# Patient Record
Sex: Male | Born: 1950
Health system: Southern US, Community
[De-identification: ages and names within clinical notes are randomized; demographics above are authoritative.]

## PROBLEM LIST (undated history)

## (undated) DIAGNOSIS — F419 Anxiety disorder, unspecified: Secondary | ICD-10-CM

## (undated) DIAGNOSIS — K5730 Diverticulosis of large intestine without perforation or abscess without bleeding: Secondary | ICD-10-CM

## (undated) DIAGNOSIS — M1990 Unspecified osteoarthritis, unspecified site: Secondary | ICD-10-CM

## (undated) DIAGNOSIS — R42 Dizziness and giddiness: Secondary | ICD-10-CM

## (undated) DIAGNOSIS — G459 Transient cerebral ischemic attack, unspecified: Secondary | ICD-10-CM

## (undated) HISTORY — PX: KNEE ARTHROPLASTY: SHX992

## (undated) HISTORY — PX: DUODENAL DIVERTICULECTOMY: SHX6386

## (undated) HISTORY — DX: Diverticulosis of large intestine without perforation or abscess without bleeding: K57.30

## (undated) HISTORY — DX: Anxiety disorder, unspecified: F41.9

---

## 1998-07-29 ENCOUNTER — Emergency Department (HOSPITAL_COMMUNITY): Admission: EM | Admit: 1998-07-29 | Discharge: 1998-07-29 | Payer: Self-pay

## 2001-01-18 ENCOUNTER — Emergency Department (HOSPITAL_COMMUNITY): Admission: EM | Admit: 2001-01-18 | Discharge: 2001-01-18 | Payer: Self-pay | Admitting: Emergency Medicine

## 2001-01-18 ENCOUNTER — Encounter: Payer: Self-pay | Admitting: Emergency Medicine

## 2001-09-04 ENCOUNTER — Encounter: Admission: RE | Admit: 2001-09-04 | Discharge: 2001-09-04 | Payer: Self-pay | Admitting: Gastroenterology

## 2001-09-04 ENCOUNTER — Encounter: Payer: Self-pay | Admitting: Gastroenterology

## 2001-12-20 ENCOUNTER — Encounter: Admission: RE | Admit: 2001-12-20 | Discharge: 2001-12-20 | Payer: Self-pay | Admitting: Gastroenterology

## 2001-12-20 ENCOUNTER — Encounter: Payer: Self-pay | Admitting: Gastroenterology

## 2002-01-15 ENCOUNTER — Encounter: Payer: Self-pay | Admitting: General Surgery

## 2002-01-22 ENCOUNTER — Encounter (INDEPENDENT_AMBULATORY_CARE_PROVIDER_SITE_OTHER): Payer: Self-pay | Admitting: *Deleted

## 2002-01-22 ENCOUNTER — Inpatient Hospital Stay (HOSPITAL_COMMUNITY): Admission: RE | Admit: 2002-01-22 | Discharge: 2002-01-28 | Payer: Self-pay | Admitting: General Surgery

## 2003-11-27 ENCOUNTER — Ambulatory Visit (HOSPITAL_BASED_OUTPATIENT_CLINIC_OR_DEPARTMENT_OTHER): Admission: RE | Admit: 2003-11-27 | Discharge: 2003-11-27 | Payer: Self-pay | Admitting: Pulmonary Disease

## 2004-02-07 ENCOUNTER — Emergency Department (HOSPITAL_COMMUNITY): Admission: EM | Admit: 2004-02-07 | Discharge: 2004-02-07 | Payer: Self-pay | Admitting: Emergency Medicine

## 2005-03-31 ENCOUNTER — Ambulatory Visit: Payer: Self-pay | Admitting: Pulmonary Disease

## 2005-04-06 ENCOUNTER — Ambulatory Visit: Payer: Self-pay | Admitting: Pulmonary Disease

## 2006-01-25 ENCOUNTER — Ambulatory Visit: Payer: Self-pay | Admitting: Pulmonary Disease

## 2006-07-03 ENCOUNTER — Emergency Department (HOSPITAL_COMMUNITY): Admission: EM | Admit: 2006-07-03 | Discharge: 2006-07-03 | Payer: Self-pay | Admitting: Emergency Medicine

## 2006-10-04 ENCOUNTER — Emergency Department (HOSPITAL_COMMUNITY): Admission: EM | Admit: 2006-10-04 | Discharge: 2006-10-04 | Payer: Self-pay | Admitting: Emergency Medicine

## 2006-10-04 ENCOUNTER — Ambulatory Visit: Payer: Self-pay | Admitting: Pulmonary Disease

## 2007-07-01 IMAGING — CR DG CHEST 1V PORT
1 series · 1 of 1 positions shown · non-contrast
Comparison: 02/07/2004

CLINICAL DATA: Shortness of breath

PORTABLE CHEST - 1 VIEW:

[view not recorded]
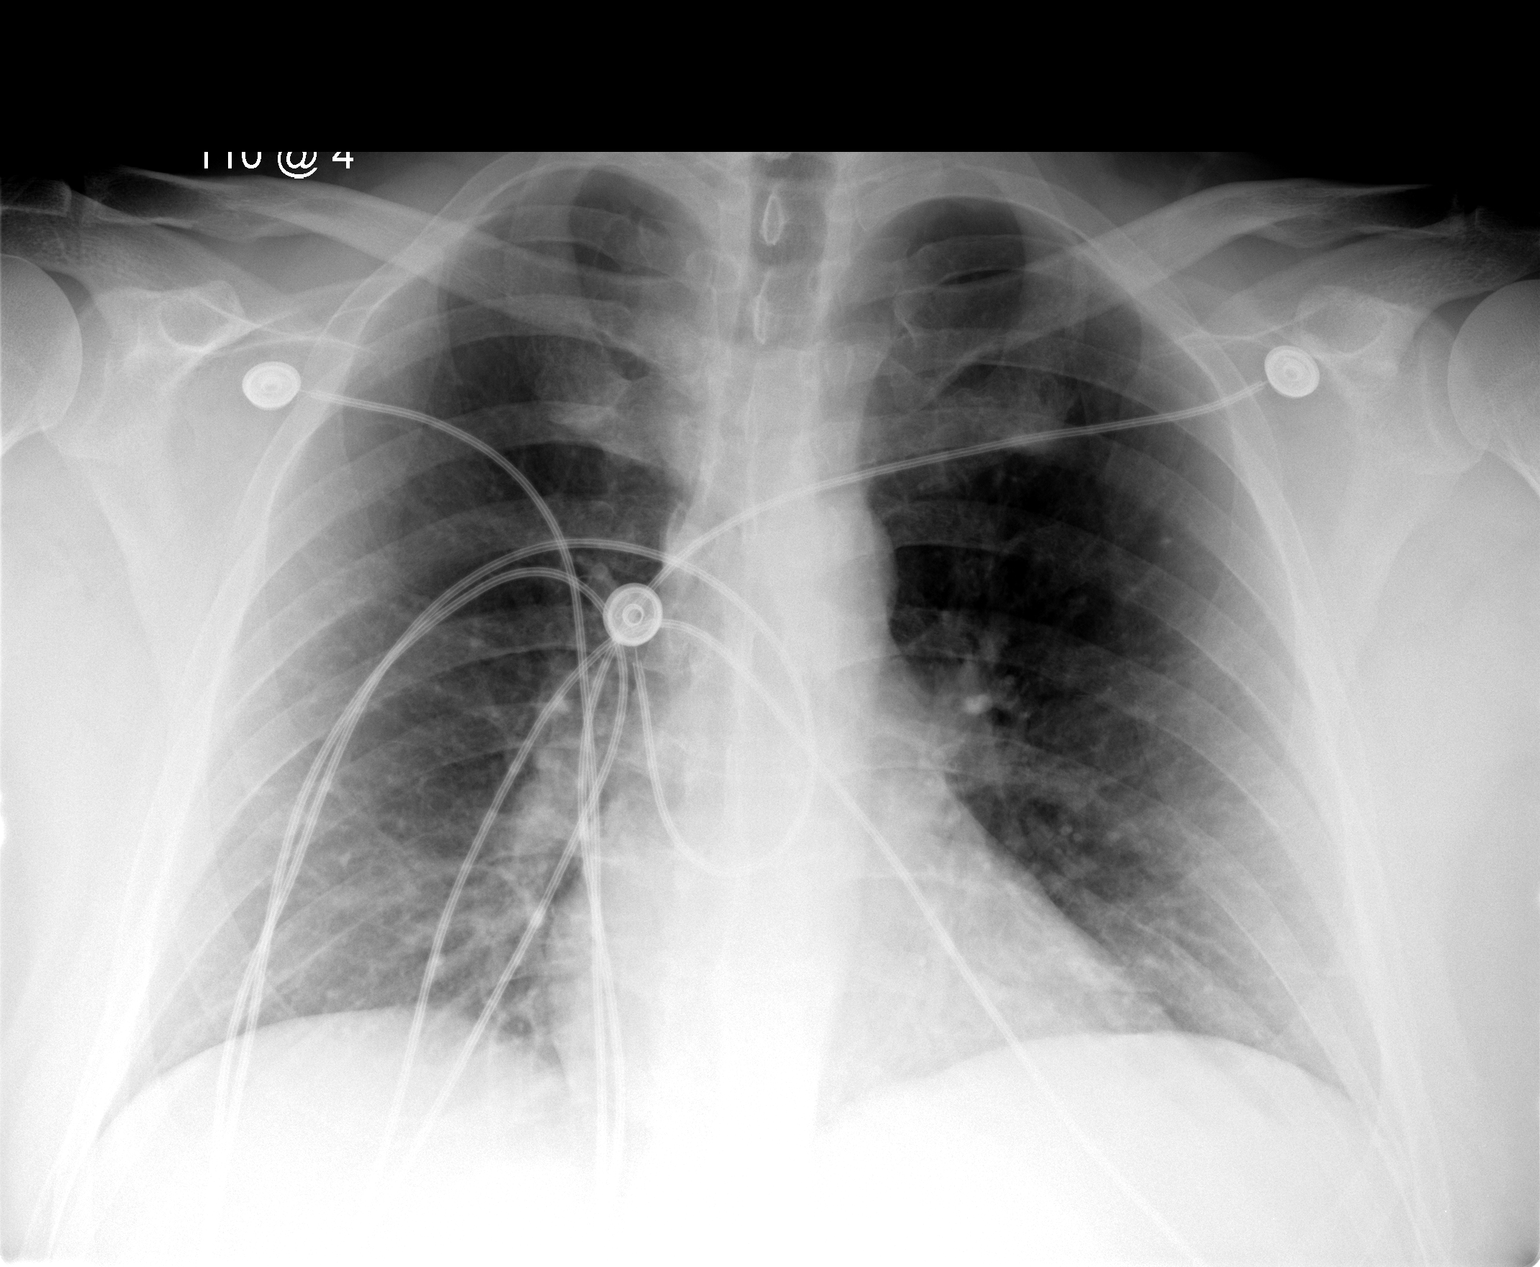

[1 of 1 positions shown; findings below may reference images not displayed]

FINDINGS: Heart and mediastinal contours are within normal limits. Minimal
bibasilar atelectasis is noted. No effusions. Visualized skeleton unremarkable.
IMPRESSION: Minimal bibasilar atelectasis.

## 2008-01-30 ENCOUNTER — Inpatient Hospital Stay (HOSPITAL_COMMUNITY): Admission: EM | Admit: 2008-01-30 | Discharge: 2008-01-31 | Payer: Self-pay | Admitting: Emergency Medicine

## 2008-01-30 ENCOUNTER — Ambulatory Visit: Payer: Self-pay | Admitting: Cardiovascular Disease

## 2008-02-13 ENCOUNTER — Ambulatory Visit: Payer: Self-pay | Admitting: Cardiovascular Disease

## 2008-03-27 ENCOUNTER — Telehealth: Payer: Self-pay | Admitting: Pulmonary Disease

## 2008-05-17 ENCOUNTER — Ambulatory Visit: Payer: Self-pay | Admitting: Pulmonary Disease

## 2008-05-17 DIAGNOSIS — G2581 Restless legs syndrome: Secondary | ICD-10-CM | POA: Insufficient documentation

## 2008-05-17 DIAGNOSIS — G4733 Obstructive sleep apnea (adult) (pediatric): Secondary | ICD-10-CM | POA: Insufficient documentation

## 2008-05-17 DIAGNOSIS — I2510 Atherosclerotic heart disease of native coronary artery without angina pectoris: Secondary | ICD-10-CM | POA: Insufficient documentation

## 2008-05-17 DIAGNOSIS — M1990 Unspecified osteoarthritis, unspecified site: Secondary | ICD-10-CM | POA: Insufficient documentation

## 2008-05-17 DIAGNOSIS — E785 Hyperlipidemia, unspecified: Secondary | ICD-10-CM

## 2008-05-17 DIAGNOSIS — E669 Obesity, unspecified: Secondary | ICD-10-CM | POA: Insufficient documentation

## 2008-05-17 DIAGNOSIS — K5732 Diverticulitis of large intestine without perforation or abscess without bleeding: Secondary | ICD-10-CM

## 2008-05-21 ENCOUNTER — Ambulatory Visit: Payer: Self-pay | Admitting: Pulmonary Disease

## 2008-06-02 LAB — CONVERTED CEMR LAB
ALT: 53 units/L (ref 0–53)
Albumin: 4.2 g/dL (ref 3.5–5.2)
Basophils Absolute: 0 10*3/uL (ref 0.0–0.1)
Basophils Relative: 0.4 % (ref 0.0–3.0)
CO2: 30 meq/L (ref 19–32)
Chloride: 103 meq/L (ref 96–112)
Cholesterol: 116 mg/dL (ref 0–200)
Eosinophils Relative: 2.2 % (ref 0.0–5.0)
GFR calc Af Amer: 113 mL/min
GFR calc non Af Amer: 93 mL/min
LDL Cholesterol: 62 mg/dL (ref 0–99)
Lymphocytes Relative: 46.5 % — ABNORMAL HIGH (ref 12.0–46.0)
MCV: 93.9 fL (ref 78.0–100.0)
Monocytes Absolute: 0.7 10*3/uL (ref 0.1–1.0)
Monocytes Relative: 12 % (ref 3.0–12.0)
Neutro Abs: 2.3 10*3/uL (ref 1.4–7.7)
PSA: 0.61 ng/mL (ref 0.10–4.00)
Potassium: 5.2 meq/L — ABNORMAL HIGH (ref 3.5–5.1)
RBC: 5.13 M/uL (ref 4.22–5.81)
RDW: 11.8 % (ref 11.5–14.6)
Total Bilirubin: 1.4 mg/dL — ABNORMAL HIGH (ref 0.3–1.2)
VLDL: 16 mg/dL (ref 0–40)
WBC: 5.9 10*3/uL (ref 4.5–10.5)

## 2008-06-04 ENCOUNTER — Telehealth (INDEPENDENT_AMBULATORY_CARE_PROVIDER_SITE_OTHER): Payer: Self-pay | Admitting: *Deleted

## 2008-06-05 ENCOUNTER — Emergency Department: Payer: Self-pay | Admitting: Emergency Medicine

## 2008-06-14 ENCOUNTER — Emergency Department: Payer: Self-pay | Admitting: Emergency Medicine

## 2008-06-26 ENCOUNTER — Encounter: Payer: Self-pay | Admitting: Pulmonary Disease

## 2008-07-03 ENCOUNTER — Encounter: Payer: Self-pay | Admitting: Pulmonary Disease

## 2008-07-25 ENCOUNTER — Encounter: Payer: Self-pay | Admitting: Pulmonary Disease

## 2008-08-22 ENCOUNTER — Encounter: Payer: Self-pay | Admitting: Pulmonary Disease

## 2009-03-31 ENCOUNTER — Emergency Department (HOSPITAL_COMMUNITY): Admission: EM | Admit: 2009-03-31 | Discharge: 2009-03-31 | Payer: Self-pay | Admitting: Emergency Medicine

## 2009-08-11 ENCOUNTER — Ambulatory Visit: Payer: Self-pay | Admitting: Pulmonary Disease

## 2009-08-12 ENCOUNTER — Ambulatory Visit: Payer: Self-pay | Admitting: Pulmonary Disease

## 2009-08-12 DIAGNOSIS — R351 Nocturia: Secondary | ICD-10-CM | POA: Insufficient documentation

## 2009-08-12 DIAGNOSIS — I10 Essential (primary) hypertension: Secondary | ICD-10-CM | POA: Insufficient documentation

## 2009-09-01 LAB — CONVERTED CEMR LAB
AST: 30 units/L (ref 0–37)
Albumin: 4.2 g/dL (ref 3.5–5.2)
BUN: 14 mg/dL (ref 6–23)
Basophils Absolute: 0 10*3/uL (ref 0.0–0.1)
Basophils Relative: 0.8 % (ref 0.0–3.0)
Bilirubin, Direct: 0.2 mg/dL (ref 0.0–0.3)
CO2: 29 meq/L (ref 19–32)
Calcium: 9.6 mg/dL (ref 8.4–10.5)
Cholesterol: 160 mg/dL (ref 0–200)
Creatinine, Ser: 0.9 mg/dL (ref 0.4–1.5)
Eosinophils Absolute: 0.1 10*3/uL (ref 0.0–0.7)
GFR calc non Af Amer: 92.62 mL/min (ref >60)
Glucose, Bld: 117 mg/dL — ABNORMAL HIGH (ref 70–99)
LDL Cholesterol: 97 mg/dL (ref 0–99)
Monocytes Absolute: 0.5 10*3/uL (ref 0.1–1.0)
Potassium: 4.5 meq/L (ref 3.5–5.1)
RDW: 12 % (ref 11.5–14.6)
Total CHOL/HDL Ratio: 4
Total Protein: 7.6 g/dL (ref 6.0–8.3)
Triglycerides: 110 mg/dL (ref 0.0–149.0)
WBC: 4.9 10*3/uL (ref 4.5–10.5)

## 2010-07-07 NOTE — Assessment & Plan Note (Signed)
Summary: 79yr reck/klw   CC:  Yearly ROV & CPX....  History of Present Illness: 60 y/o WM here for a follow up visit... he has a hx of OSA, RLS, Diverticulitis w/ surgery in 2003, and DJD... he was hosp in Aug09 w/ CP and ruled out for an MI- subseq cath showed non-obstructive CAD and he is being followed by [facility]...   ~  May 17, 2008:  he has two concerns today-  1) Fatigue: this was felt to be poss secondary to his BBlocker therapy & DrMcAlhany outlined his rec's for tapering the dose to see if the fatigue improves (ie- decrease from 25Bid to 12.5Bid)... 2) Urinary symptoms: he notes some freq and nocturia, but he drinks alot of water daily... no LTOS, no real irritative bladder symptoms, etc... we discussed decr water intake- esp after dinner- & see if the freq/ nocturia diminishes...   ~  August 11, 2009:  he is upset that his non-obstructive CAD Dx has cost him extra on his insurance premiums & we discussed this... he stopped both his Lopressor & Simvastatin in 2010 due to symptoms of fatigue, no energy, "I just couldn't function"- all improved off these meds... but BP is up >150/90, weight still 286#, & prev FLPs not at goal on diet alone... we discussed trying Bystolic, & checking FLP off the med for new baseline...    Current Problems:   OBSTRUCTIVE SLEEP APNEA (ICD-327.23) - sleep study 6/05 showed mod OSA w/ RDI= 28 & oxygen desat to the 70's... he was intolerant to the CPAP & has not used this... we discussed options including ENT surg, oral appliance, etc- but he is not interested... he denies daytime hypersomnolence, restless sleeping, loud snoring, & he rests well, wakes refreshed...  ~  CXR 8/09 showed suboptimal inspir, NAD.Marland Kitchen.  ~  CXR 3/11 showed clear, NAD- sl low lung vol noted...  HYPERTENSION, BORDERLINE (ICD-401.9) CAD (ICD-414.00) - on ASA 81mg /d,  prev on Metoprolol 12.5Bid but he stopped in2010 due to fatigue... BP= 150/90 now & he denies HA, visual changes, CP,  palipit, dizziness, syncope, dyspnea, edema, etc...  ~  RISKS: neg FamHx, ex-smoker quit 1997, borderline HBP, borderline Lipids, no DM, +overweight...  ~  cath 8/09 showed norm Lmain, 40% midLAD, plaque in CIRC, norm RCA, norm LV w/ EF= 60%.  ~  3/11:  we discussed trying BYSTOLIC 5mg /d, watch BP, call for symptoms.  HYPERCHOLESTEROLEMIA, BORDERLINE (ICD-272.4) - prev on Simva40 (pt stopped on his own in2010).  ~  FLP 8/07 showed TChol 163, TG 82, HDL 38, LDL 109... on diet Rx.  ~  FLP 8/09 showed TChol 166, TG 120, HDL 29, LDL 113... Simva40 started.  ~  FLP 12/09 on Simva40 showed TChol 116, TG 79, HDL 39, LDL 62  ~  FLP 3/11 off Simva showed TChol   OVERWEIGHT (ICD-278.02) - we discussed diet + exercise therapy...  ~  weight in 1983 =  223#... 6\' 3"  tall & BMI = 28  ~  weight in 1997 =  235#  ~  weight in 2007 = 250#... BMI = 31-2  ~  weight 12/09 = 287#  ~  weight 3/11 = 286#... BMI = 36  Hx of DIVERTICULITIS, COLON (ICD-562.11) - hx diverticulitis w/ increased pain in 2003... elective sigmoid colectomy 8/03 by Raynelle Bring showed a contained diverticular rupture... he has done well since then... one repeat bout of mild diverticulitis treated by DrEdwards/ Magod w/ Cipro/ Flagyl 1/10 & resolved...  ~  colonoscopy  3/08 by DrSam showed divertics, 3-4 mm polyps (no path sent), & hems...  ~  colonoscopy 2/10 showed diverticulosis, 2mm polyp= hyperplastic, int hems... f/u planned 69yrs.  DEGENERATIVE JOINT DISEASE (ICD-715.90) - he has seen DrYates in the past w/ gastroc medial head rupture, treated conservatively... hx LBP w/ XRays showing dengen changes in L5-S1 region...  RESTLESS LEG SYNDROME (ICD-333.94) - he had a large # of leg jerks w/ sleep disruption on the 2003/11/03 sleep study... he used Requip 1mg  Qhs prev w/ good control & weaned off this med, he doesn't feel that he needs it now.   Allergies (verified): No Known Drug Allergies  Comments:  Nurse/Medical Assistant: The patient's  medications and allergies were reviewed with the patient and were updated in the Medication and Allergy Lists.  Past History:  Past Medical History:  OBSTRUCTIVE SLEEP APNEA (ICD-327.23) HYPERTENSION, BORDERLINE (ICD-401.9) CAD (ICD-414.00) HYPERCHOLESTEROLEMIA, BORDERLINE (ICD-272.4) OVERWEIGHT (ICD-278.02) Hx of NOCTURIA (ICD-788.43) Hx of DIVERTICULITIS, COLON (ICD-562.11) DEGENERATIVE JOINT DISEASE (ICD-715.90) RESTLESS LEG SYNDROME (ICD-333.94)  Past Surgical History: S/P sigmoid colectomy 11-02-2001 by DrHIngram  Family History: Father alive, age 6, in good general health... Mother died age 31 (in 2008-11-02), after fall w/ femur fx & complications. 2 Siblings- sisters in good general health  Social History: Reviewed history from 05/17/2008 and no changes required. 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  Review of Systems  The patient denies fever, chills, sweats, anorexia, fatigue, weakness, malaise, weight loss, sleep disorder, blurring, diplopia, eye irritation, eye discharge, vision loss, eye pain, photophobia, earache, ear discharge, tinnitus, decreased hearing, nasal congestion, nosebleeds, sore throat, hoarseness, chest pain, palpitations, syncope, dyspnea on exertion, orthopnea, PND, peripheral edema, cough, dyspnea at rest, excessive sputum, hemoptysis, wheezing, pleurisy, nausea, vomiting, diarrhea, constipation, change in bowel habits, abdominal pain, melena, hematochezia, jaundice, gas/bloating, indigestion/heartburn, dysphagia, odynophagia, dysuria, hematuria, urinary frequency, urinary hesitancy, nocturia, incontinence, back pain, joint pain, joint swelling, muscle cramps, muscle weakness, stiffness, arthritis, sciatica, restless legs, leg pain at night, leg pain with exertion, rash, itching, dryness, suspicious lesions, paralysis, paresthesias, seizures, tremors, vertigo, transient blindness, frequent falls, frequent headaches,  difficulty walking, depression, anxiety, memory loss, confusion, cold intolerance, heat intolerance, polydipsia, polyphagia, polyuria, unusual weight change, abnormal bruising, bleeding, enlarged lymph nodes, urticaria, allergic rash, hay fever, and recurrent infections.    Vital Signs:  Patient profile:   60 year old male Height:      76 inches Weight:      285.50 pounds BMI:     34.88 O2 Sat:      96 % on Room air Temp:     96.0 degrees F oral Pulse rate:   62 / minute BP sitting:   150 / 92  (right arm) Cuff size:   regular  Vitals Entered By: Randell Loop CMA (August 11, 2009 2:24 PM)  O2 Sat at Rest %:  96 O2 Flow:  Room air CC: Yearly ROV & CPX... Is Patient Diabetic? No Pain Assessment Patient in pain? no      Comments meds updated today   Physical Exam  Additional Exam:  WD, Overweight, 60 y/o WM in NAD... GENERAL:  Alert & oriented; pleasant & cooperative... HEENT:  [place]/AT, EOM-wnl, PERRLA, EACs-clear, TMs-wnl, NOSE-clear, THROAT-clear & wnl. NECK:  Supple w/ full ROM; no JVD; normal carotid impulses w/o bruits; no thyromegaly or nodules palpated; no lymphadenopathy. CHEST:  Clear to P & A; without wheezes/ rales/ or rhonchi. HEART:  Regular Rhythm; without murmurs/ rubs/ or gallops. ABDOMEN:  Soft & nontender; normal bowel sounds; no organomegaly or masses detected. RECTAL:  Neg - prostate 2+ & nontender w/o nodules; stool hematest neg. EXT: without deformities, mild arthritic changes; no varicose veins/ venous insuffic/ or edema. NEURO:  CN's intact; motor testing normal; sensory testing normal; gait normal & balance OK. DERM:  No lesions noted; no rash etc...    CXR  Procedure date:  08/11/2009  Findings:      CHEST - 2 VIEW Comparison: 01/30/2008   Findings: Trachea is midline.  Heart size normal.  Lungs are somewhat low in volume but clear.  No pleural fluid.   IMPRESSION: No acute findings.   Read By:  Reyes Ivan.,  M.D.   MISC.  Report  Procedure date:  08/11/2009  Findings:      Pt is not fasting this AM & will ret to our lab one morning this week for his FASTING blood work... we will contact hime w/ results and determine whether to try a diff Statin Rx...  SN   Impression & Recommendations:  Problem # 1:  PHYSICAL EXAMINATION (ICD-V70.0) We discussed his non-obstructive CAD & reviewed risk factor reduction strategy... Orders: T-2 View CXR ([id number]) Ret for FASTING labs...  Problem # 2:  HYPERTENSION, BORDERLINE (ICD-401.9) We decided to start BYSTOLIC 5mg  trial for efficacy & tolerability... The following medications were removed from the medication list:    Metoprolol Tartrate 25 Mg Tabs (Metoprolol tartrate) .Marland Kitchen... Take 1/2 tab by mouth two times a day... His updated medication list for this problem includes:    Bystolic 5 Mg Tabs (Nebivolol hcl) .Marland Kitchen... Take 1 tab by mouth once daily...  Problem # 3:  CAD (ICD-414.00) As noted-  no angina, doing well... The following medications were removed from the medication list:    Metoprolol Tartrate 25 Mg Tabs (Metoprolol tartrate) .Marland Kitchen... Take 1/2 tab by mouth two times a day... His updated medication list for this problem includes:    Aspirin Adult Low Strength 81 Mg Tbec (Aspirin) .Marland Kitchen... Take 1 tablet by mouth once a day    Bystolic 5 Mg Tabs (Nebivolol hcl) .Marland Kitchen... Take 1 tab by mouth once daily...  Problem # 4:  HYPERCHOLESTEROLEMIA, BORDERLINE (ICD-272.4) He stopped the Statin Rx & on diet alone... f/u FLP pending, consider diff statin med. The following medications were removed from the medication list:    Simvastatin 40 Mg Tabs (Simvastatin) .Marland Kitchen... Take one tablet by mouth at bedtime  Problem # 5:  OVERWEIGHT (ICD-278.02) We reviewed diet + exercise...  Problem # 6:  Hx of DIVERTICULITIS, COLON (ICD-562.11) No recurrent bouts on proper diet... followed by DrEdwards for GI...  Problem # 7:  OTHER MEDICAL PROBLEMS AS NOTED>>>  Complete Medication  List: 1)  Aspirin Adult Low Strength 81 Mg Tbec (Aspirin) .... Take 1 tablet by mouth once a day 2)  Bystolic 5 Mg Tabs (Nebivolol hcl) .... Take 1 tab by mouth once daily...  Other Orders: Prescription Created Electronically [id number])  Patient Instructions: 1)  Today we updated your med list- see below.... 2)  We decided to try the BYSTOLIC one tab daily for BP & Heart... let me know if you have any problems on this medication.Marland KitchenMarland Kitchen 3)  Today we did your follow up CXR.Marland KitchenMarland Kitchen  4)  Please return to our lab one morning this week for your FASTING blood work... then please call the "phone tree" in a few days for your lab results.Marland KitchenMarland Kitchen 5)  Don, let's get on track w/ our diet & exercise  program- the goal is to lose 15-20 lbs... 6)  Continue to monitor your BP at home & call for questions... Prescriptions: BYSTOLIC 5 MG TABS (NEBIVOLOL HCL) take 1 tab by mouth once daily...  #30 x prn   Entered and Authorized by:   Michele Mcalpine MD   Signed by:   Michele Mcalpine MD on 08/11/2009   Method used:   Print then Give to Patient   RxID:   [id number]

## 2010-10-20 NOTE — Discharge Summary (Signed)
NAMETORRIAN, Sean NO.:  Mullins   MEDICAL RECORD NO.:  Mullins          PATIENT TYPE:  INP   LOCATION:  3735                         FACILITY:  [facility]   PHYSICIAN:  Verne Carrow, MDDATE OF BIRTH:  January 31, 1951   DATE OF ADMISSION:  01/30/2008  DATE OF DISCHARGE:  01/31/2008                               DISCHARGE SUMMARY   PRIMARY CARDIOLOGIST:  Verne Carrow, MD   PRIMARY CARE Jalexa Pifer:  Lonzo Cloud. Kriste Basque, MD   DISCHARGE DIAGNOSIS:  Chest pain.   SECONDARY DIAGNOSES:  1. Nonobstructive coronary artery disease.  2. History of diverticulitis, status post partial bowel resection in      2003.  3. Remote tobacco abuse.   ALLERGIES:  No known drug allergies.   PROCEDURES:  Left heart cardiac catheterization.   HISTORY OF PRESENT ILLNESS:  A 60 year old Caucasian male without prior  cardiac history.  He was in his usual state of health until the morning  of January 30, 2008, when while at work he developed left-sided chest  pain radiating to the left upper arm and shoulder associated with  diaphoresis and lightheadedness.  Symptoms last approximately 5 minutes,  however, recurred 2 additional times prompting to present to the [facility] ED.  In the ED, his troponin was negative and ECG showed no acute  ST or T changes.  The patient was admitted for further evaluation.   HOSPITAL COURSE:  The patient underwent left heart cardiac  catheterization on January 30, 2008, revealing nonobstructive coronary  artery disease with an EF of 60% and normal wall motion.  He has had no  recurrent chest discomfort and is being discharged home today in good  condition.  We have initiated low-dose aspirin, low-dose beta-blocker  therapy, and simvastatin therapy.   DISCHARGE LABORATORY DATA:  Hemoglobin 15.1, hematocrit 43.4, WBC 5.9,  platelets 181.  Sodium 139, potassium 4.1, chloride 105, CO2 25, BUN 10,  creatinine 0.81, glucose 127.  CK 80, MB 1.2,  troponin I 0.01.  Total  cholesterol 166, triglycerides 120, HDL 29, LDL 113.   DISPOSITION:  The patient is being discharged home today in good  condition.   FOLLOWUP PLANS AND APPOINTMENTS:  We have arranged for followup with Dr.  Verne Carrow on February 13, 2008, at 9:15 a.m.  He is asked to  follow up with Dr. Kriste Basque as previously scheduled.   DISCHARGE MEDICATIONS:  1. Aspirin 81 mg daily.  2. Lopressor 25 mg b.i.d.  3. Simvastatin 40 mg at bedtime.   OUTSTANDING LAB STUDIES:  None.   DURATION DISCHARGE ENCOUNTER:  Thirty five minutes including physician  time.      Nicolasa Ducking, ANP      Verne Carrow, MD  Electronically Signed    CB/MEDQ  D:  01/31/2008  T:  02/01/2008  Job:  [id number]   cc:   Lonzo Cloud. Kriste Basque, MD

## 2010-10-20 NOTE — Assessment & Plan Note (Signed)
[facility]                            CARDIOLOGY OFFICE NOTE   NAME:Goodlin, YONATAN GUITRON                    MRN:          [id number]  DATE:02/13/2008                            DOB:          Jun 17, 1950    HISTORY OF PRESENT ILLNESS:  Mr. Mckoy is a pleasant 60 year old  Caucasian male with no significant past medical history other than  diverticulitis and remote tobacco abuse who was admitted to [facility] on January 30, 2008, with complaints of left shoulder pain, left-  sided chest pain, diaphoresis, and dizziness.  He ruled out for a  myocardial infarction and had no ischemic EKG changes.  Left heart  catheterization was performed on January 30, 2008, and showed  nonobstructive coronary artery disease as outlined below.  The patient  was discharged to home on January 31, 2008, and has done well since that  time.  He comes in today for his first visit in my office.  He states  that he has had no recurrence of his chest pain, shortness of breath,  palpitations, or diaphoresis.  He also denies any orthopnea, PND, or  lower extremity swelling.  Following his hospital discharge, he traveled  to [place] and went fishing for 3 days.  He has remained fairly inactive  prior to his visit today, but he is eager to get back playing golf this  week.   PAST MEDICAL HISTORY:  1. Nonobstructive coronary artery disease.  2. Diverticulitis.  3. Remote tobacco use.   CURRENT MEDICATIONS:  1. Aspirin 81 mg once daily.  2. Lopressor 25 mg twice daily.  3. Simvastatin 40 mg at night.  4. ReQuip 5 mg at night.   ALLERGIES:  No known drug allergies.   PAST SURGICAL HISTORY:  None.   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  He is a former smoker.  He had quit 12 years ago.  He  drinks alcohol socially, but does not drink to excess.  He uses no  illicit drugs.  He currently does not use tobacco.  He is  married.   FAMILY HISTORY:  The patient's parents are alive and well.  They have no  cardiac disease.  He has 2 younger sisters who are alive and well  without cardiac disease.  There is no family history of premature  coronary artery disease or sudden cardiac death.   REVIEW OF SYSTEMS:  As stated in history of present illness and  otherwise negative.   PHYSICAL EXAMINATION:  GENERAL:  He is a pleasant 60 year old Caucasian  male, in no acute distress.  VITAL SIGNS:  Weight 281 pounds, blood pressure 118/72, pulse 54 and  regular, and respirations 12 and nonlabored.  NECK:  No JVD.  No carotid bruits.  No lymphadenopathy.  No thyromegaly.  SKIN:  Warm and dry.  Oropharynx clear.  Mucous membranes moist.  LUNGS:  Clear to auscultation bilaterally without wheezes, rhonchi, or  crackles noted.  CARDIOVASCULAR:  Bradycardia with normal first and second heart sounds.  Rhythm is regular.  No murmurs, gallops, or  rubs are noted.  ABDOMEN:  Obese and soft.  Bowel sounds present.  EXTREMITIES:  No evidence of edema.  Pulses are 2+.  The right groin  cath site has no evidence of hematoma or ecchymosis.  Pulses are 2+ in  all extremities.   DIAGNOSTIC STUDIES:  1. A 12-lead electrocardiogram obtained in our office today shows      sinus bradycardia and is otherwise normal.  2. Left heart catheterization performed on January 30, 2008, with      plaque disease in the proximal LAD, 40% stenosis in the mid LAD,      20% stenosis in the middle portion of the LAD, and has a takeoff of      a large diagonal branch.  His plaque disease was noted in the      circumflex.  There were 2 large obtuse marginals, they were free of      disease.  The right coronary artery was a nondominant artery and      was free of disease.  The left ventricular function was noted to be      normal with an ejection fraction of 60%.  Left ventricular      pressures were 125/4 with an end-diastolic pressure of 12.  Aortic       pressure was 133/89.  3. The patient does bring with him today a card that shows his blood      pressure and pulse readings over the last 9 days.  His systolic      blood pressures ranged from 125-153 and his diastolic pressures      ranged from 67-85.  His pulse has mostly been in the upper 60s to      low 70s.   ASSESSMENT/PLAN:  This is a pleasant 60 year old Caucasian male who is  status post recent hospitalization with complaints of left-sided chest  pain associated with diaphoresis and dizziness.  He did rule out for a  myocardial infarction and had a left heart catheterization, which showed  nonobstructive coronary artery disease with normal left ventricular  function.  He was discharged to home with medications that included  Lopressor, a baby aspirin, and a statin medication.  He has done well on  these medications and has no complaints today.  I will make no changes  at the current time.  I have discussed the use of beta-blocker therapy  with the patient.  He is to let us know if he begins to feel weak,  especially in the setting of a heart rate in the 50s.  If that does  become a problem, we could lower his dose of Lopressor to 12.5 mg twice  daily.  In regards to his blood pressure, he is to continue checking  this and alert [place] if that remains above 130 systolically.  This time, I  will make no medication changes.  If his blood  pressure remains elevated, I would consider adding hydrochlorothiazide  into his medical regimen.  I will plan on seeing him back in the office  in 6 months.     Verne Carrow, MD  Electronically Signed    CM/MedQ  DD: 02/13/2008  DT: 02/13/2008  Job #: [id number]   cc:   Lonzo Cloud. Kriste Basque, MD  Rollene Rotunda, MD, [facility]

## 2010-10-20 NOTE — Cardiovascular Report (Signed)
NAMETJ, KITCHINGS NO.:  [id number]   MEDICAL RECORD NO.:  [id number]          PATIENT TYPE:  INP   LOCATION:  3735                         FACILITY:  [facility]   PHYSICIAN:  Verne Carrow, MDDATE OF BIRTH:  [birth year]   DATE OF PROCEDURE:  01/30/2008  DATE OF DISCHARGE:                            CARDIAC CATHETERIZATION   CARDIAC CATHETERIZATION REPORT.   PROCEDURES PERFORMED:  1. Left heart catheterization  2. Selective coronary angiography.  3. Left ventricular angiogram.  4. Placement of Angio-Seal femoral artery closure device.   OPERATOR:  Verne Carrow, MD   INDICATIONS:  Chest pain in a 61 year old Caucasian male with obesity  and history of tobacco abuse.   PROCEDURE IN DETAIL:  The patient was brought to the heart  catheterization laboratory after signing informed consent.  His right  groin was prepped and draped in a sterile fashion.  A 6-French sheath  was inserted into the right femoral artery.  Standard Judkins catheters  were used to inject the left coronary system and the right coronary  artery.  A pigtail catheter was used to cross the aortic valve into the  left ventricle.  After a left ventricular angiogram was performed, a  pullback was performed across the aortic valve.  The patient tolerated  the procedure well.  The right femoral arteriotomy site was closed with  an Angio-Seal closure device.  The patient was taken to the recovery  room in stable condition.   FINDINGS OF PROCEDURE:  1. Left main coronary artery bifurcates into the circumflex and LAD      and has no disease.  2. The LAD has a plaque in the proximal segment, 40% stenosis in the      mid segment, and another 20-30% stenosis in the mid segment at the      take off of a large diagonal branch.  3. The circumflex was the dominant artery and has plaque in the      proximal segment.  There are 2 obtuse marginal branches that are      free of disease.  4. The  right coronary artery is a non-dominant artery and is free of      disease.  5. The left ventricle has normal wall motion and an ejection fraction      of 60%.   HEMODYNAMIC DATA:  Left ventricle 125/4, end-diastolic pressure 12, and  aortic pressure 133/89.   IMPRESSION:  1. Isolated coronary plaque disease.  2. Normal left ventricular function.   RECOMMENDATIONS:  I recommend medical management of this patient's  isolated coronary plaque disease.      Verne Carrow, MD  Electronically Signed     CM/MEDQ  D:  01/30/2008  T:  01/31/2008  Job:  [id number]   cc:   Veverly Fells. Excell Seltzer, MD

## 2010-10-20 NOTE — H&P (Signed)
NAMEDAYTONA, RETANA NO.:  [id number]   MEDICAL RECORD NO.:  [id number]          PATIENT TYPE:  EMS   LOCATION:  MAJO                         FACILITY:  [facility]   PHYSICIAN:  Veverly Fells. Excell Seltzer, MD  DATE OF BIRTH:  10-11-1950   DATE OF ADMISSION:  01/30/2008  DATE OF DISCHARGE:                              HISTORY & PHYSICAL   PRIMARY CARE PHYSICIAN:  Lonzo Cloud. Kriste Basque, MD.   CHIEF COMPLAINT:  Chest pain.   HISTORY OF PRESENT ILLNESS:  Mr. Ashraf is a 60 year old gentleman who  was at work this morning when he developed left-sided chest pain  radiating to the left upper arm and shoulder.  His pain was initially  sharp and then turned into a dull ache in the left arm.  He was at rest  when his pain started.  He complained of associated diaphoresis and  lightheadedness.  He sat to the ground and felt generally weak.  He also  had associated dyspnea.  The entire episode lasted for less than 5  minutes.  He has had two recurrent episodes since that time.  Since he  has been in the emergency department, he has had no further symptoms.  Over the last several weeks,  he denies any exertional symptoms and  overall has been in his normal state health.  He maintains an active  lifestyle.  He plays golf regularly and does other moderate level  activity.  He has not engaged in any formal exercise.  He has had no  prior cardiac problems.  His symptoms today resolved without any  medication or other intervention.   PAST MEDICAL HISTORY:  Diverticulitis status post partial bowel  resection in 2003, former tobacco abuse.  Otherwise, medical history is  negative for hypertension, diabetes, dyslipidemia or other chronic  medical problems.   MEDICATIONS:  None.   ALLERGIES:  No known drug allergies.   FAMILY HISTORY:  The patient's parents are alive and well.  They have no  cardiac disease.  He has two younger sisters who are alive and well  without cardiac disease.  There is  no history of myocardial infarction,  stroke or peripheral arterial disease in the family.   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  He is a former smoker, but quit 12 years ago.  He  drinks alcohol socially, but does not drink to excess.  He is married.   REVIEW OF SYSTEMS:  A complete 12-point review of systems was performed.  There were no pertinent positives except as outlined in the H&P.   PHYSICAL EXAMINATION:  GENERAL APPEARANCE:  The patient is alert and  oriented.  He is an obese white male in no acute distress.  VITAL SIGNS:  Temperature 97.8, heart rate 69, respiratory rate 20,  blood pressure 120/76.  HEENT:  Normal.  NECK:  Normal carotid upstrokes without bruits.  JVP normal.  No  thyromegaly or thyroid nodules.  LUNGS:  Clear to auscultation bilaterally.  HEART:  The apex is not palpable.  Heart has regular rate and rhythm.  There are no murmurs or gallops.  ABDOMEN:  Soft, obese, nontender, no bruits.  No organomegaly.  BACK:  No CVA tenderness.  EXTREMITIES:  No clubbing, cyanosis or edema.  Peripheral pulses are 2+  and equal throughout.  SKIN:  Warm and dry.  There is no rash.  NEUROLOGIC:  Cranial nerves II-XII are intact.  Strength is intact and  equal bilaterally.   EKG shows sinus rhythm.  There are no significant ST-segment or T-wave  changes within normal limits.   LABORATORY DATA:  Potassium 4.1, creatinine 0.9, glucose 110.  Hemoglobin 16, hematocrit 47, platelet count 217,000.  Point of care  myoglobin is 48, CK-MB is less than 1, troponin is less than 0.05.   Chest x-ray showed no active disease with suboptimal level of  inspiration.   ASSESSMENT:  This is a 60 year old male with recurrent episodes of chest  pain over the course of the morning.  I am concerned about Mr.  Follette's symptoms and especially the associated dyspnea, diaphoresis  and lightheadedness.  The patient's risk factors for  coronary artery  disease include former tobacco use and obesity.  Otherwise, there is a  paucity of cardiovascular risk factors.  His initial objective data is  normal based on his EKG and point of care cardiac markers.  Mr. Cisnero  clearly needs an ischemia evaluation.  I have reviewed the potential  options which include inpatient stress testing versus diagnostic  catheterization.  In this gentleman with recurrent symptoms and a  concerning history, I would favor definitive evaluation with diagnostic  catheterization.  I have reviewed the risks and indications in detail.  The patient would like to discuss this with his wife.  Will proceed  after their discussion.  He will be admitted to a telemetry bed and  treated with aspirin and beta blocker.  We will check a fasting lipid  panel.  We will cycle cardiac enzymes and EKGs.  Further plans pending  results of his testing.      Veverly Fells. Excell Seltzer, MD  Electronically Signed     MDC/MEDQ  D:  01/30/2008  T:  01/30/2008  Job:  [id number]   cc:   Lonzo Cloud. Kriste Basque, MD

## 2010-10-23 NOTE — Discharge Summary (Signed)
NAME:  Sean Mullins, DRONE                       ACCOUNT NO.:  [id number]   MEDICAL RECORD NO.:  [id number]                   PATIENT TYPE:  INP   LOCATION:  0380                                 FACILITY:  [facility]   PHYSICIAN:  Angelia Mould. Derrell Lolling, M.D.             DATE OF BIRTH:  1951-06-07   DATE OF ADMISSION:  01/22/2002  DATE OF DISCHARGE:  01/28/2002                                 DISCHARGE SUMMARY   FINAL DIAGNOSES:  Acute and chronic diverticulitis with evidence of  contained diverticular rupture.   OPERATION:  Sigmoid colectomy, January 22, 2002.   HISTORY OF PRESENT ILLNESS:  This is a 60 year old white man who has a one-  year history of intermittent episodes of left lower quadrant pain.  He has  been treated as an outpatient.  He has been seen in the emergency room.  A  colonoscopy in September 2002, showed diverticulosis of the descending colon  to sigmoid colon, but no bleeding, stricture, or mass.  He continued to have  episodes of pain on antibiotics.  A CT scan in March 2003, another CT scan  in July 2003, showed focal diverticulosis and focal soft-tissue stranding  and inflammatory change of the sigmoid colon.  He was seen as an outpatient  on December 27, 2001.  At that time he was stable but had some tenderness in the  left lower quadrant of the abdomen, but no mass.  It was felt that he was  having recurrent bouts of diverticulitis and was at risk for perforation and  abscess.  An elective sigmoid colectomy was offered.  He was brought to the  hospital after a two-day bowel prep at home.   PHYSICAL EXAMINATION:  GENERAL:  Pleasant middle-aged man.  VITAL SIGNS:  Height stated to be 6 feet 2 inches.  Weight stated to be 248  pounds.  HEART:  Regular rate and rhythm.  No murmur.  LUNGS:  Clear to auscultation.  ABDOMEN:  Soft.  Not distended.  Minimal tenderness in the left lower  quadrant.  No hernias.  EXTREMITIES:  No edema.  Good pulses.  NEUROLOGIC:  Grossly  within normal limits.   HOSPITAL COURSE:  On the day of admission the patient was taken to the  operating room and underwent a sigmoid colectomy.  He was found to have a  focal segment of diverticulitis in the mid to distal sigmoid colon because  of an inflammatory mass the size of a baseball with adherence to the pelvic  sidewall.  The rest of the colon and small bowel and abdominal exploration  were unremarkable.  Final pathology report showed diverticulitis with  evidence of rupture.  No malignancy seen.   Postoperatively he did well.  He progressed steadily in his diet and  activities, without any apparent complication.  He was started on a liquid  diet on January 25, 2002, and did well with that.  He began passing  flatus  and bowel movements that day, and his diet was advanced steadily thereafter.  He was discharged on January 28, 2002.  At that  time he was afebrile, having bowel movements, tolerating a regular diet, and  had no other problems.  His staples were removed, and the wound was Steri-  Stripped.  Arrangements were made for him to return to the office in four to  five days for a wound check.  He was given a prescription for Vicodin for  pain.                                               Angelia Mould. Derrell Lolling, M.D.    HMI/MEDQ  D:  02/02/2002  T:  02/02/2002  Job:  [id number]   cc:   Lonzo Cloud. Kriste Basque, M.D. [facility]   Fayrene Fearing L. Malon Kindle., M.D.  [address]  [place]  [place] [address]  Fax: [phone]

## 2010-10-23 NOTE — Procedures (Signed)
NAME:  KADAN, MILLSTEIN NO.:  [id number]   MEDICAL RECORD NO.:  [id number]          PATIENT TYPE:  OUT   LOCATION:  SLEEP CENTER                 FACILITY:  [facility]   PHYSICIAN:  Marcelyn Bruins, M.D. [facility] DATE OF BIRTH:  04/30/1951   DATE OF ADMISSION:  11/27/2003  DATE OF DISCHARGE:  11/27/2003                              NOCTURNAL POLYSOMNOGRAM    .  REFERRING PHYSICIAN:  Lonzo Cloud. Kriste Basque, M.D.   INDICATIONS FOR THE STUDY:  Daytime hypersomnia with obstructive sleep  apnea.   SLEEP ARCHITECTURE:  Total sleep time was 300 minutes with significantly  decreased REM and slow wave sleep.  Sleep onset latency was short at 6  minutes; however, REM onset latency was quite prolonged.   IMPRESSION/RECOMMENDATIONS:  1. Split-night study reveals moderate obstructive sleep apnea with a     Respiratory Disturbance Index of 28 events per hour and oxygen     desaturations into the 70s.  All of the events occurred primarily in the     supine position.  There was loud, intermittent snoring associated with     the patient's events.  As per protocol, attempts were made to start the     patient on CPAP and then finally BiPAP; however, the patient was not able     to tolerate.  The positive pressure device was subsequently removed, and     the study was continued, as before.  Would recommend deconditioning     therapy and possibly a sedative hypnotic combined with low initial CPAP     pressure.  2. No clinically significant cardiac arrhythmias.  3. Large numbers of leg jerks with significant sleep disruptions.  Clinical     correlation is suggested.                                   ______________________________                                Marcelyn Bruins, M.D. [facility]     KC/MEDQ  D:  12/20/2003 14:12:25  T:  12/21/2003 21:42:43  Job:  [id number]

## 2010-10-23 NOTE — Op Note (Signed)
Sean Mullins, Sean Mullins                      ACCOUNT NO.:  [id number]   MEDICAL RECORD NO.:  [id number]                   PATIENT TYPE:  INP   LOCATION:  X003                                 FACILITY:  [facility]   PHYSICIAN:  Angelia Mould. Derrell Lolling, M.D.             DATE OF BIRTH:  13-Jun-1950   DATE OF PROCEDURE:  01/22/2002  DATE OF DISCHARGE:                                 OPERATIVE REPORT   PREOPERATIVE DIAGNOSIS:  Diverticulitis.   POSTOPERATIVE DIAGNOSIS:  Diverticulitis.   OPERATION PERFORMED:  Sigmoid colectomy.   SURGEON:  Angelia Mould. Derrell Lolling, M.D.   FIRST ASSISTANT:  Gita Kudo, M.D.   OPERATIVE INDICATIONS:  This is a 60 year old white man, who began having  episodes of left lower quadrant abdominal pain in July 2002.  He has had 4-5  episodes and has been treated with antibiotics on several occasions.  Colonoscopy in September 2002 showed diverticulosis of the descending and  sigmoid and no bleeding or obstruction or polyps.  This colonoscopy went all  the way to the cecum.  He has had CT scans in March of this year and again  in July of this year, and both of these showed focal diverticulitis and  inflammatory changes of the sigmoid colon.  Because of his recurrent  episodes, he is felt to be at risk for perforation and abscess, and elective  sigmoid colectomy was offered.  He has undergone bowel prep at home and is  brought to the operating room electively.   OPERATIVE FINDINGS:  The patient had a focal segment of diverticulitis in  the mid to distal sigmoid colon.  There was an inflammatory mass the size of  a baseball with signs of chronic inflammation and dense adherence to the  left pelvic sidewall.  The colon above and below this area was perfectly  normal, and he had minimal diverticula elsewhere.  Small bowel felt normal.  There was no abscess.  The left ureter was not involved.  The left ureter  was easily seen and preserved throughout the case.   OPERATIVE TECHNIQUE:  Following the induction of general endotracheal  anesthesia, the patient's abdomen was prepped and draped in a sterile  fashion.  A lower midline laparotomy incision was made, and the abdomen was  entered and explored with findings as described above.  We packed the small  bowel, using self-retaining retractors.  The descending colon and sigmoid  colon were mobilized by dividing their lateral peritoneal attachments.  The  areas around the inflammatory mass were taken down both sharply and bluntly,  but this was high on the pelvic wall and was not involving the left ureter.  We explored the left retroperitoneum and identified the left ureter which  looked normal.   We selected an area above and below the inflammatory mass for transection.  We cleaned the colon off about six inches above the inflammatory mass and  divided it between [employer]  clamps.  Mesenteric vessels were then isolated,  clamped, divided, and ligated to take the mesentery down distally.  We  mobilized the mid to distal sigmoid colon all the way down to the proximal  rectum about 3-4 inches below the inflammatory mass.  We placed stay sutures  of silk in the colon and then clamped off the colon about 3-4 inches below  the inflammatory mass and divided it sharply.  The specimen was sent to  pathology.   The colon had been mobilized adequately so that we could create an end  anastomosis without any tension whatsoever.  The colon was clean and well-  prepped.  Anastomosis was treated in an end-to-end fashion with interrupted  sutures of 3-0 silk and 2-0 silk.  Corner sutures of 2-0 silk were placed,  inverting the mucosa.  Inverting suture of 3-0 silk was placed in the  midline of the posterior wall of the anastomosis and tied down.  Interrupted  inverting sutures of 3-0 silk were then placed across the posterior wall of  the anastomosis.  This was inspected and found to be satisfactory.  Corner  sutures  were placed to turn in the colon at the corners.  The anterior wall  of the anastomosis was then closed with interrupted inverting sutures of 3-0  silk.  Dr. Maryagnes Amos and I both felt that we had adequate closure.  A few extra  sutures were placed in the anastomosis.  We inspected the anastomosis and  found that it was satisfactory.   We then changed our instruments and gloves and suction devices.  The lower  abdomen and pelvic was copiously irrigated with about three liters of  saline.  There was no bleeding.  The mesentery was closed with interrupted  sutures of 3-0 and 2-0 silk.  All of the retractors and packs were removed.  The colon and small bowel were returned to their anatomic positions.  The  omentum was brought down under the abdominal wall.  The midline fascia was  closed with a running suture of #1 PDS and the skin closed with skin  staples.  Clean bandages were placed and the patient taken to the recovery  room in stable condition.  Estimated blood loss was about 150-200 cc.  Complications none.  Sponge, needle, and instrument counts were correct.                                               Angelia Mould. Derrell Lolling, M.D.    HMI/MEDQ  D:  01/22/2002  T:  01/22/2002  Job:  [id number]   cc:   Lonzo Cloud. Kriste Basque, M.D. [facility]   Fayrene Fearing L. Malon Kindle., M.D.

## 2013-10-17 ENCOUNTER — Encounter: Payer: Self-pay | Admitting: Pulmonary Disease

## 2015-04-25 ENCOUNTER — Emergency Department: Payer: BLUE CROSS/BLUE SHIELD

## 2015-04-25 ENCOUNTER — Encounter: Payer: Self-pay | Admitting: Emergency Medicine

## 2015-04-25 ENCOUNTER — Inpatient Hospital Stay
Admission: EM | Admit: 2015-04-25 | Discharge: 2015-04-26 | DRG: 069 | Disposition: A | Discharge status: Home / Self Care | Payer: BLUE CROSS/BLUE SHIELD | Attending: Internal Medicine | Admitting: Internal Medicine

## 2015-04-25 DIAGNOSIS — R29898 Other symptoms and signs involving the musculoskeletal system: Secondary | ICD-10-CM

## 2015-04-25 DIAGNOSIS — I639 Cerebral infarction, unspecified: Secondary | ICD-10-CM | POA: Diagnosis present

## 2015-04-25 DIAGNOSIS — E669 Obesity, unspecified: Secondary | ICD-10-CM | POA: Diagnosis present

## 2015-04-25 DIAGNOSIS — R42 Dizziness and giddiness: Secondary | ICD-10-CM

## 2015-04-25 DIAGNOSIS — M1990 Unspecified osteoarthritis, unspecified site: Secondary | ICD-10-CM | POA: Diagnosis present

## 2015-04-25 DIAGNOSIS — G459 Transient cerebral ischemic attack, unspecified: Principal | ICD-10-CM | POA: Diagnosis present

## 2015-04-25 DIAGNOSIS — R202 Paresthesia of skin: Secondary | ICD-10-CM

## 2015-04-25 DIAGNOSIS — E162 Hypoglycemia, unspecified: Secondary | ICD-10-CM | POA: Diagnosis present

## 2015-04-25 DIAGNOSIS — E7439 Other disorders of intestinal carbohydrate absorption: Secondary | ICD-10-CM | POA: Diagnosis present

## 2015-04-25 DIAGNOSIS — R079 Chest pain, unspecified: Secondary | ICD-10-CM | POA: Diagnosis not present

## 2015-04-25 DIAGNOSIS — Z6833 Body mass index (BMI) 33.0-33.9, adult: Secondary | ICD-10-CM

## 2015-04-25 DIAGNOSIS — R471 Dysarthria and anarthria: Secondary | ICD-10-CM

## 2015-04-25 DIAGNOSIS — Z79899 Other long term (current) drug therapy: Secondary | ICD-10-CM

## 2015-04-25 DIAGNOSIS — Z87891 Personal history of nicotine dependence: Secondary | ICD-10-CM

## 2015-04-25 HISTORY — DX: Unspecified osteoarthritis, unspecified site: M19.90

## 2015-04-25 LAB — BASIC METABOLIC PANEL
Anion gap: 9 (ref 5–15)
BUN: 14 mg/dL (ref 6–20)
CALCIUM: 9.2 mg/dL (ref 8.9–10.3)
CO2: 24 mmol/L (ref 22–32)
CREATININE: 0.8 mg/dL (ref 0.61–1.24)
Chloride: 103 mmol/L (ref 101–111)
GFR calc Af Amer: >60 mL/min (ref >60)
GLUCOSE: 226 mg/dL — AB (ref 65–99)
Potassium: 3.9 mmol/L (ref 3.5–5.1)
Sodium: 136 mmol/L (ref 135–145)

## 2015-04-25 LAB — URINALYSIS COMPLETE WITH MICROSCOPIC (ARMC ONLY)
BACTERIA UA: NONE SEEN
BILIRUBIN URINE: NEGATIVE
GLUCOSE, UA: NEGATIVE mg/dL
Hgb urine dipstick: NEGATIVE
Ketones, ur: NEGATIVE mg/dL
Leukocytes, UA: NEGATIVE
NITRITE: NEGATIVE
Protein, ur: NEGATIVE mg/dL
SPECIFIC GRAVITY, URINE: 1.01 (ref 1.005–1.030)
Squamous Epithelial / LPF: NONE SEEN
pH: 7 (ref 5.0–8.0)

## 2015-04-25 LAB — CBC
HCT: 42.8 % (ref 40.0–52.0)
Hemoglobin: 15.1 g/dL (ref 13.0–18.0)
MCH: 32.5 pg (ref 26.0–34.0)
MCHC: 35.3 g/dL (ref 32.0–36.0)
MCV: 92.1 fL (ref 80.0–100.0)
PLATELETS: 193 10*3/uL (ref 150–440)
RBC: 4.65 MIL/uL (ref 4.40–5.90)
RDW: 13 % (ref 11.5–14.5)
WBC: 5.9 10*3/uL (ref 3.8–10.6)

## 2015-04-25 LAB — TROPONIN I

## 2015-04-25 LAB — GLUCOSE, CAPILLARY: GLUCOSE-CAPILLARY: 217 mg/dL — AB (ref 65–99)

## 2015-04-25 MED ORDER — IOHEXOL 350 MG/ML SOLN
100.0000 mL | Freq: Once | INTRAVENOUS | Status: AC | PRN
  Administered 2015-04-25: 100 mL via INTRAVENOUS

## 2015-04-25 MED ORDER — MORPHINE SULFATE (PF) 4 MG/ML IV SOLN
4.0000 mg | Freq: Once | INTRAVENOUS | Status: AC
  Administered 2015-04-25: 4 mg via INTRAVENOUS
  Filled 2015-04-25: qty 1

## 2015-04-25 MED ORDER — ONDANSETRON HCL 4 MG/2ML IJ SOLN
4.0000 mg | Freq: Once | INTRAMUSCULAR | Status: AC
  Administered 2015-04-25: 4 mg via INTRAVENOUS
  Filled 2015-04-25: qty 2

## 2015-04-25 NOTE — ED Notes (Signed)
Pt called EMS for chest pressure associated with SOB and dizziness. Pt has hx of hypoglycemia, and thought his sugar Was getting low so sat down and ate a candy bar. Pt complains of pressure to left side chest, nausea, and left arm Numbness and tingling.

## 2015-04-25 NOTE — ED Notes (Signed)
Lab called at this time due to add on orders.

## 2015-04-25 NOTE — ED Notes (Signed)
Patient transported to CT 

## 2015-04-25 NOTE — ED Notes (Signed)
SOC happening at this time, RN at bedside.

## 2015-04-25 NOTE — ED Provider Notes (Signed)
[facility] Emergency Department Provider Note   ____________________________________________  Time seen: 7:35 PM I have reviewed the triage vital signs and the triage nursing note.  HISTORY  Chief Complaint Dizziness and Chest Pain   Historian Patient, wife and 2 daughters  HPI Sean Mullins is a 64 y.o. male who is here for evaluation of dizziness and chest discomfort. Patient states he was at work when he developed lightheadedness/dizziness and thought maybe his blood sugar might be low and so he ate a candy bar. This was around 2:15 this afternoon. He finished work and left for home just after 5 PM. At that point time he noticed that his left arm felt tingling. When he got home he started feeling some mild shortness of breath associated with some central chest discomfort. He has no history of stroke or cardiac disease. He did have some nausea. No vomiting. No abdominal pain. No recent trauma.  No noted slurred speech or trouble finding words, however family states that he seems to be a little slow to answer and process. He has not noticed problems with coordination, weakness, numbness, nor facial droop.    Past Medical History  Diagnosis Date  . Arthritis     Patient Active Problem List   Diagnosis Date Noted  . HYPERTENSION, BORDERLINE 08/12/2009  . NOCTURIA 08/12/2009  . HYPERCHOLESTEROLEMIA, BORDERLINE 05/17/2008  . OVERWEIGHT 05/17/2008  . OBSTRUCTIVE SLEEP APNEA 05/17/2008  . RESTLESS LEG SYNDROME 05/17/2008  . CAD 05/17/2008  . DIVERTICULITIS, COLON 05/17/2008  . DEGENERATIVE JOINT DISEASE 05/17/2008    Past Surgical History  Procedure Laterality Date  . Duodenal diverticulectomy      2003    No current outpatient prescriptions on file.  Allergies Review of patient's allergies indicates no known allergies.  History reviewed. No pertinent family history.  Social History Social History  Substance Use Topics  . Smoking status:  Former Research scientist (life sciences)  . Smokeless tobacco: None  . Alcohol Use: Yes     Comment: ocassional     Review of Systems  Constitutional: Negative for fever. Eyes: Negative for visual changes. ENT: Negative for sore throat. Cardiovascular: Positive for central chest pressure/pain.Marland Kitchen Respiratory: Negative for cough Gastrointestinal: Negative for abdominal pain, vomiting and diarrhea. Genitourinary: Negative for dysuria. Musculoskeletal: Negative for back pain. Skin: Negative for rash. Neurological: Positive for headache, global, mild.. 10 point Review of Systems otherwise negative ____________________________________________   PHYSICAL EXAM:  VITAL SIGNS: ED Triage Vitals  Enc Vitals Group     BP 04/25/15 1845 128/110 mmHg     Pulse Rate 04/25/15 1845 62     Resp 04/25/15 1845 16     Temp 04/25/15 1845 97.7 F (36.5 C)     Temp Source 04/25/15 1845 Oral     SpO2 04/25/15 1845 95 %     Weight 04/25/15 1844 270 lb (122.471 kg)     Height 04/25/15 1844 6\' 3"  (1.905 m)     Head Cir --      Peak Flow --      Pain Score 04/25/15 1839 0     Pain Loc --      Pain Edu? --      Excl. in [place]? --      Constitutional: Alert and oriented. Well appearing and in no distress. Eyes: Conjunctivae are normal. PERRL. Normal extraocular movements. Eyes tearing bilaterally small amount. ENT   Head: Normocephalic and atraumatic.   Nose: No congestion/rhinnorhea.   Mouth/Throat: Mucous membranes are moist.  Neck: No stridor. Cardiovascular/Chest: Normal rate, regular rhythm.  No murmurs, rubs, or gallops. Respiratory: Normal respiratory effort without tachypnea nor retractions. Breath sounds are clear and equal bilaterally. No wheezes/rales/rhonchi. Gastrointestinal: Soft. No distention, no guarding, no rebound. Nontender. Obese.  Genitourinary/rectal:  Deferred Musculoskeletal: Nontender with normal range of motion in all extremities. No joint effusions.  No lower extremity tenderness.   No edema. Neurologic: Slightly slow to answer questions, but no dysarthria or aphasia. 4 out of 5 strength in left lower extremity. 5 out of 5 strength left upper extremity and right upper and lower extremities. Paresthesia left upper extremity and left face including forehead. No facial droop. Normal tongue protrusion. Skin:  Skin is warm, dry and intact. No rash noted. Psychiatric: Mood and affect are normal. Speech and behavior are normal. Patient exhibits appropriate insight and judgment.  ____________________________________________   EKG I, Lisa Roca, MD, the attending physician have personally viewed and interpreted all ECGs.  Normal sinus rhythm. 67 bpm. Narrow QRS. Normal axis. Normal ST and T-wave. ____________________________________________  LABS (pertinent positives/negatives)  Basic metabolic panel within normal limits except glucose 226 CBC within normal limits Troponin less than 0.03  ____________________________________________  RADIOLOGY All Xrays were viewed by me. Imaging interpreted by Radiologist.  Chest x-ray two-view: No active cardiopulmonary disease  CT head noncontrast: normal exam  CT angiogram head: Negative CTA head __________________________________________  PROCEDURES  Procedure(s) performed: None  Critical Care performed: CRITICAL CARE Performed by: Lisa Roca   Total critical care time: 30 minutes  Critical care time was exclusive of separately billable procedures and treating other patients.  Critical care was necessary to treat or prevent imminent or life-threatening deterioration.  Critical care was time spent personally by me on the following activities: development of treatment plan with patient and/or surrogate as well as nursing, discussions with consultants, evaluation of patient's response to treatment, examination of patient, obtaining history from patient or surrogate, ordering and performing treatments and  interventions, ordering and review of laboratory studies, ordering and review of radiographic studies, pulse oximetry and re-evaluation of patient's condition.   ____________________________________________   ED COURSE / ASSESSMENT AND PLAN  CONSULTATIONS: Telephone neurology consultation. Hospitalist consultation for admission  Pertinent labs & imaging results that were available during my care of the patient were reviewed by me and considered in my medical decision making (see chart for details).  The patient's triage complaint was chest pain associated with nausea and shortness of breath, however on further discussion, his symptoms started with dizziness this afternoon around 2 PM, followed by some left arm tingling around 5 PM, raising concern for possible neurologic source of his symptoms. His EKG is reassuring as is his laboratory evaluation. On physical examination he does have 4-5 strength in his left lower extremity and tingling of the left face and left arm raising concern for possible acute stroke or other intracranial source of neurologic symptoms. Code stroke was initiated upon obtaining this history. However his symptoms likely started around 2 PM, and so he is outside the window for possible TPA, in addition his symptoms are minor, and I wouldn't consider him a candidate for TPA based on his stroke scale of 2.   Neurologist felt the patient had a stroke scale of 3, but did not feel he was in to be a candidate due to the time of onset around 2 PM. He recommended CT angiogram head to rule out basal circulation clot. He also recommended evaluation for dissection given the chest discomfort.  Patient  could not get CT angiogram of head as well as chest/abdomen to rule out dissection due to contrast bolus load. Preference was given to the anterior of the head and this was negative.  Clinically patient's symptoms do not seem consistent with dissection, his chest discomfort was minimal and  is him proved. His chest x-ray is reassuring. He has no white mediastinum. His blood pressure is reassuring. If this is still a consideration, he could have a contrasted scan tomorrow per radiology.  Patient will be admitted for further workup/evaluation for possible stroke.   Patient / Family / Caregiver informed of clinical course, medical decision-making process, and agree with plan.    ___________________________________________   FINAL CLINICAL IMPRESSION(S) / ED DIAGNOSES   Final diagnoses:  Left leg weakness  Left face and left arm tingling  Chest pain, unspecified  Dizziness       Lisa Roca, MD 04/25/15 2221

## 2015-04-25 NOTE — ED Notes (Signed)
Patient transported to CT at this time. 

## 2015-04-25 NOTE — ED Notes (Signed)
Patient transported to CT by RN Margarita Grizzle at this time

## 2015-04-25 NOTE — Progress Notes (Signed)
04/25/15 2000  Clinical Encounter Type  Visited With Patient and family together  Visit Type Initial;Spiritual support  Referral From Nurse  Consult/Referral To Chaplain  Paged to room for code stroke. Met with patient, wife, 2 daughters. Patient worried about potential stroke. Offered comforting presence. Sean Mullins

## 2015-04-25 NOTE — ED Notes (Signed)
MD Lord at bedside at this time  

## 2015-04-25 NOTE — ED Notes (Signed)
Pt passed swallow screen, pt given sandwich tray and drink.

## 2015-04-26 ENCOUNTER — Inpatient Hospital Stay: Payer: BLUE CROSS/BLUE SHIELD

## 2015-04-26 ENCOUNTER — Inpatient Hospital Stay
Admit: 2015-04-26 | Discharge: 2015-04-26 | Disposition: A | Discharge status: Home / Self Care | Payer: BLUE CROSS/BLUE SHIELD | Attending: Internal Medicine | Admitting: Internal Medicine

## 2015-04-26 DIAGNOSIS — G459 Transient cerebral ischemic attack, unspecified: Secondary | ICD-10-CM | POA: Diagnosis present

## 2015-04-26 DIAGNOSIS — E162 Hypoglycemia, unspecified: Secondary | ICD-10-CM | POA: Diagnosis present

## 2015-04-26 DIAGNOSIS — M1990 Unspecified osteoarthritis, unspecified site: Secondary | ICD-10-CM | POA: Diagnosis present

## 2015-04-26 DIAGNOSIS — E7439 Other disorders of intestinal carbohydrate absorption: Secondary | ICD-10-CM | POA: Diagnosis present

## 2015-04-26 DIAGNOSIS — Z6833 Body mass index (BMI) 33.0-33.9, adult: Secondary | ICD-10-CM | POA: Diagnosis not present

## 2015-04-26 DIAGNOSIS — R079 Chest pain, unspecified: Secondary | ICD-10-CM | POA: Diagnosis present

## 2015-04-26 DIAGNOSIS — I639 Cerebral infarction, unspecified: Secondary | ICD-10-CM | POA: Diagnosis present

## 2015-04-26 DIAGNOSIS — Z79899 Other long term (current) drug therapy: Secondary | ICD-10-CM | POA: Diagnosis not present

## 2015-04-26 DIAGNOSIS — E669 Obesity, unspecified: Secondary | ICD-10-CM | POA: Diagnosis present

## 2015-04-26 DIAGNOSIS — Z87891 Personal history of nicotine dependence: Secondary | ICD-10-CM | POA: Diagnosis not present

## 2015-04-26 LAB — LIPID PANEL
Cholesterol: 155 mg/dL (ref 0–200)
HDL: 34 mg/dL — ABNORMAL LOW (ref >40)
LDL Cholesterol: 100 mg/dL — ABNORMAL HIGH (ref 0–99)
Total CHOL/HDL Ratio: 4.6 RATIO
Triglycerides: 106 mg/dL (ref <150)
VLDL: 21 mg/dL (ref 0–40)

## 2015-04-26 LAB — HEMOGLOBIN A1C
Hgb A1c MFr Bld: 6.5 % — ABNORMAL HIGH (ref 4.0–6.0)
Hgb A1c MFr Bld: 6.6 % — ABNORMAL HIGH (ref 4.0–6.0)

## 2015-04-26 LAB — GLUCOSE, CAPILLARY: Glucose-Capillary: 133 mg/dL — ABNORMAL HIGH (ref 65–99)

## 2015-04-26 LAB — TSH: TSH: 1.498 u[IU]/mL (ref 0.350–4.500)

## 2015-04-26 MED ORDER — MORPHINE SULFATE (PF) 2 MG/ML IV SOLN: 2.0000 mg | INTRAVENOUS | Status: DC | PRN

## 2015-04-26 MED ORDER — POTASSIUM CHLORIDE 20 MEQ PO PACK
5.0000 meq | PACK | Freq: Every day | ORAL | Status: DC
  Administered 2015-04-26: 08:00:00 5 meq via ORAL
  Filled 2015-04-26: qty 1

## 2015-04-26 MED ORDER — LABETALOL HCL 5 MG/ML IV SOLN
10.0000 mg | INTRAVENOUS | Status: DC | PRN
Start: 2015-04-26 — End: 2015-04-27
  Filled 2015-04-26: qty 4

## 2015-04-26 MED ORDER — ROSUVASTATIN CALCIUM 10 MG PO TABS
10.0000 mg | ORAL_TABLET | Freq: Every day | ORAL | Status: DC
  Administered 2015-04-26: 08:00:00 10 mg via ORAL
  Filled 2015-04-26: qty 1

## 2015-04-26 MED ORDER — ONDANSETRON HCL 4 MG PO TABS: 4.0000 mg | ORAL_TABLET | Freq: Four times a day (QID) | ORAL | Status: DC | PRN

## 2015-04-26 MED ORDER — ACETAMINOPHEN 325 MG PO TABS
650.0000 mg | ORAL_TABLET | Freq: Four times a day (QID) | ORAL | Status: DC | PRN
  Administered 2015-04-26: 18:00:00 650 mg via ORAL
  Filled 2015-04-26: qty 2

## 2015-04-26 MED ORDER — SODIUM CHLORIDE 0.9 % IJ SOLN
3.0000 mL | Freq: Two times a day (BID) | INTRAMUSCULAR | Status: DC
  Administered 2015-04-26: 08:00:00 3 mL via INTRAVENOUS

## 2015-04-26 MED ORDER — ONDANSETRON HCL 4 MG/2ML IJ SOLN: 4.0000 mg | Freq: Four times a day (QID) | INTRAMUSCULAR | Status: DC | PRN

## 2015-04-26 MED ORDER — ASPIRIN EC 81 MG PO TBEC: 81.0000 mg | DELAYED_RELEASE_TABLET | Freq: Every day | ORAL | Status: AC

## 2015-04-26 MED ORDER — LORAZEPAM 1 MG PO TABS
1.0000 mg | ORAL_TABLET | Freq: Once | ORAL | Status: AC
  Administered 2015-04-26: 11:00:00 1 mg via ORAL
  Filled 2015-04-26: qty 1

## 2015-04-26 MED ORDER — DOCUSATE SODIUM 100 MG PO CAPS
100.0000 mg | ORAL_CAPSULE | Freq: Two times a day (BID) | ORAL | Status: DC
  Administered 2015-04-26: 08:00:00 100 mg via ORAL
  Filled 2015-04-26: qty 1

## 2015-04-26 MED ORDER — HEPARIN SODIUM (PORCINE) 5000 UNIT/ML IJ SOLN
5000.0000 [IU] | Freq: Three times a day (TID) | INTRAMUSCULAR | Status: DC
  Administered 2015-04-26 (×2): 5000 [IU] via SUBCUTANEOUS
  Filled 2015-04-26 (×2): qty 1

## 2015-04-26 MED ORDER — ACETAMINOPHEN 650 MG RE SUPP: 650.0000 mg | Freq: Four times a day (QID) | RECTAL | Status: DC | PRN

## 2015-04-26 MED ORDER — ATORVASTATIN CALCIUM 10 MG PO TABS: 10.0000 mg | ORAL_TABLET | Freq: Every day | ORAL | Status: DC

## 2015-04-26 MED ORDER — POTASSIUM GLUCONATE 595 (99 K) MG PO TABS: 1190.0000 mg | ORAL_TABLET | Freq: Every day | ORAL | Status: DC

## 2015-04-26 MED ORDER — ASPIRIN EC 81 MG PO TBEC
81.0000 mg | DELAYED_RELEASE_TABLET | Freq: Every day | ORAL | Status: DC
  Administered 2015-04-26: 08:00:00 81 mg via ORAL
  Filled 2015-04-26: qty 1

## 2015-04-26 MED ORDER — CLOPIDOGREL BISULFATE 75 MG PO TABS
75.0000 mg | ORAL_TABLET | Freq: Every day | ORAL | Status: DC
  Administered 2015-04-26: 08:00:00 75 mg via ORAL
  Filled 2015-04-26: qty 1

## 2015-04-26 MED ORDER — STROKE: EARLY STAGES OF RECOVERY BOOK
Freq: Once | Status: AC
  Administered 2015-04-26: 02:00:00

## 2015-04-26 NOTE — Progress Notes (Signed)
Physical Therapy Evaluation Patient Details Name: JADIEL FARB MRN: [id number] DOB: December 26, 1950 Today's Date: 04/26/2015   History of Present Illness  Patient is a 64 y.o. male admitted on 19 Nov. for CVA. Patient had hx of worsening dizziness, attritubting it to low glucose initially. Admits to falling before coming to E.R.  Clinical Impression  Patient is a previously independent male who experienced worsening dizziness at work yesterday, leading to a fall later at home prior to coming to E.R. Patient able to perform all mobility independently upon evaluation, with no deficits in strength, sensation, or balance. Patient does not require continued f/u from PT, as he is at his baseline level of functioning.    Follow Up Recommendations No PT follow up    Equipment Recommendations  None recommended by PT    Recommendations for Other Services       Precautions / Restrictions Precautions Precautions: None Restrictions Weight Bearing Restrictions: No      Mobility  Bed Mobility Overal bed mobility: Independent             General bed mobility comments: Patient independent in all aspects of bed mobility, rolling and moving from side to sit without assistance.  Transfers Overall transfer level: Independent Equipment used: None             General transfer comment: Patient transfers from sit to stand and stand to sit with no dizziness/LOB.  Ambulation/Gait Ambulation/Gait assistance: Independent Ambulation Distance (Feet): 360 Feet Assistive device: None Gait Pattern/deviations: WFL(Within Functional Limits)     General Gait Details: Patient ambulates at slightly decreased cadence with no assistive device and SBA. Patient demonstrates no LOB.  Stairs            Wheelchair Mobility    Modified Rankin (Stroke Patients Only)       Balance Overall balance assessment: Independent                                            Pertinent Vitals/Pain Pain Assessment: No/denies pain    Home Living Family/patient expects to be discharged to:: Private residence Living Arrangements: Spouse/significant other Available Help at Discharge: Family Type of Home: House       Home Layout: One level Home Equipment: None      Prior Function Level of Independence: Independent               Hand Dominance        Extremity/Trunk Assessment   Upper Extremity Assessment: Overall WFL for tasks assessed           Lower Extremity Assessment: Overall WFL for tasks assessed         Communication   Communication: No difficulties  Cognition Arousal/Alertness: Awake/alert Behavior During Therapy: WFL for tasks assessed/performed Overall Cognitive Status: Within Functional Limits for tasks assessed                      General Comments      Exercises        Assessment/Plan    PT Assessment Patent does not need any further PT services  PT Diagnosis Generalized weakness   PT Problem List    PT Treatment Interventions     PT Goals (Current goals can be found in the Care Plan section) Acute Rehab PT Goals Patient Stated Goal: "To go home." PT Goal Formulation:  With patient/family Time For Goal Achievement: 05/10/15 Potential to Achieve Goals: Good    Frequency     Barriers to discharge        Co-evaluation               End of Session Equipment Utilized During Treatment: Gait belt Activity Tolerance: Patient tolerated treatment well Patient left: in bed;with call bell/phone within reach;with family/visitor present           Time: 0916-0930 PT Time Calculation (min) (ACUTE ONLY): 14 min   Charges:   PT Evaluation $Initial PT Evaluation Tier I: 1 Procedure     PT G Codes:        Dorice Lamas, PT, DPT 04/26/2015, 9:41 AM

## 2015-04-26 NOTE — Progress Notes (Signed)
Discharge instructions gone over with patient and family, both peripheral IV's removed, prescriptions given to patient, stressed importance of follow up with PCP next week, pt wheeled out to care via wheelchair, into vechile unassited. Told call back with any questions

## 2015-04-26 NOTE — Consult Note (Signed)
Reason for Consult: stroke Referring Physician: Dr. [name] is an 64 y.o. male.  HPI: 64 yo RHD M presents to [facility] due to multiple symptoms.  Pt states that he felt lightheaded at work like his sugar was low then he ate a candy bar.  A little later the dizziness returned so he decided to drive home.  En route to home, pt notes that he felt nauseated and then had chest pain and then had some numbness in his L arm and L leg.  He has never had this before.  He also had a slight headache after.  Today, he feels back to baseline  Past Medical History  Diagnosis Date  . Arthritis     Past Surgical History  Procedure Laterality Date  . Duodenal diverticulectomy      2003    History reviewed. No pertinent family history.  Social History:  reports that he has quit smoking. He does not have any smokeless tobacco history on file. He reports that he drinks alcohol. His drug history is not on file.  Allergies: No Known Allergies  Medications: personally reviewed by me  Results for orders placed or performed during the hospital encounter of 04/25/15 (from the past 48 hour(s))  Basic metabolic panel     Status: Abnormal   Collection Time: 04/25/15  6:36 PM  Result Value Ref Range   Sodium 136 135 - 145 mmol/L   Potassium 3.9 3.5 - 5.1 mmol/L   Chloride 103 101 - 111 mmol/L   CO2 24 22 - 32 mmol/L   Glucose, Bld 226 (H) 65 - 99 mg/dL   BUN 14 6 - 20 mg/dL   Creatinine, Ser 0.80 0.61 - 1.24 mg/dL   Calcium 9.2 8.9 - 10.3 mg/dL   GFR calc non Af Amer >60 >60 mL/min   GFR calc Af Amer >60 >60 mL/min    Comment: (NOTE) The eGFR has been calculated using the CKD EPI equation. This calculation has not been validated in all clinical situations. eGFR's persistently <60 mL/min signify possible Chronic Kidney Disease.    Anion gap 9 5 - 15  CBC     Status: None   Collection Time: 04/25/15  6:36 PM  Result Value Ref Range   WBC 5.9 3.8 - 10.6 K/uL   RBC 4.65 4.40 - 5.90  MIL/uL   Hemoglobin 15.1 13.0 - 18.0 g/dL   HCT 42.8 40.0 - 52.0 %   MCV 92.1 80.0 - 100.0 fL   MCH 32.5 26.0 - 34.0 pg   MCHC 35.3 32.0 - 36.0 g/dL   RDW 13.0 11.5 - 14.5 %   Platelets 193 150 - 440 K/uL  Troponin I     Status: None   Collection Time: 04/25/15  6:36 PM  Result Value Ref Range   Troponin I <0.03 <0.031 ng/mL    Comment:        NO INDICATION OF MYOCARDIAL INJURY.   TSH     Status: None   Collection Time: 04/25/15  6:36 PM  Result Value Ref Range   TSH 1.498 0.350 - 4.500 uIU/mL  Hemoglobin A1c     Status: Abnormal   Collection Time: 04/25/15  6:36 PM  Result Value Ref Range   Hgb A1c MFr Bld 6.5 (H) 4.0 - 6.0 %  Glucose, capillary     Status: Abnormal   Collection Time: 04/25/15  6:37 PM  Result Value Ref Range   Glucose-Capillary 217 (H) 65 - 99  mg/dL  Urinalysis complete, with microscopic ([facility] only)     Status: Abnormal   Collection Time: 04/25/15  8:15 PM  Result Value Ref Range   Color, Urine YELLOW (A) YELLOW   APPearance CLEAR (A) CLEAR   Glucose, UA NEGATIVE NEGATIVE mg/dL   Bilirubin Urine NEGATIVE NEGATIVE   Ketones, ur NEGATIVE NEGATIVE mg/dL   Specific Gravity, Urine 1.010 1.005 - 1.030   Hgb urine dipstick NEGATIVE NEGATIVE   pH 7.0 5.0 - 8.0   Protein, ur NEGATIVE NEGATIVE mg/dL   Nitrite NEGATIVE NEGATIVE   Leukocytes, UA NEGATIVE NEGATIVE   RBC / HPF 0-5 0 - 5 RBC/hpf   WBC, UA 0-5 0 - 5 WBC/hpf   Bacteria, UA NONE SEEN NONE SEEN   Squamous Epithelial / LPF NONE SEEN NONE SEEN   Mucous PRESENT   Lipid panel     Status: Abnormal   Collection Time: 04/26/15  5:22 AM  Result Value Ref Range   Cholesterol 155 0 - 200 mg/dL   Triglycerides 106 <150 mg/dL   HDL 34 (L) >40 mg/dL   Total CHOL/HDL Ratio 4.6 RATIO   VLDL 21 0 - 40 mg/dL   LDL Cholesterol 100 (H) 0 - 99 mg/dL    Comment:        Total Cholesterol/HDL:CHD Risk Coronary Heart Disease Risk Table                     Men   Women  1/2 Average Risk   3.4   3.3  Average Risk        5.0   4.4  2 X Average Risk   9.6   7.1  3 X Average Risk  23.4   11.0        Use the calculated Patient Ratio above and the CHD Risk Table to determine the patient's CHD Risk.        ATP III CLASSIFICATION (LDL):  <100     mg/dL   Optimal  100-129  mg/dL   Near or Above                    Optimal  130-159  mg/dL   Borderline  160-189  mg/dL   High  >190     mg/dL   Very High     Ct Angio Head W/cm &/or Wo Cm  04/25/2015  CLINICAL DATA:  Hypoglycemic, status post candy bar. LEFT chest pressure, LEFT arm numbness and tingling. EXAM: CT ANGIOGRAPHY HEAD TECHNIQUE: Multidetector CT imaging of the head was performed using the standard protocol during bolus administration of intravenous contrast. Multiplanar CT image reconstructions and MIPs were obtained to evaluate the vascular anatomy. CONTRAST:  141m OMNIPAQUE IOHEXOL 350 MG/ML SOLN COMPARISON:  CT head April 25, 2015 at 1955 hours FINDINGS: Anterior circulation: Normal appearance of the cervical internal carotid arteries, petrous, cavernous and supra clinoid internal carotid arteries. Diminutive versus absent anterior communicating artery. Normal appearance of the anterior and middle cerebral arteries. Posterior circulation: RIGHT vertebral artery is dominant with normal appearance of the vertebral arteries, vertebrobasilar junction and basilar artery, as well as main branch vessels. Robust RIGHT posterior communicating artery, small on the LEFT. Normal appearance of the posterior cerebral arteries. No large vessel occlusion, hemodynamically significant stenosis, dissection, luminal irregularity, contrast extravasation or aneurysm within the anterior nor posterior circulation. IMPRESSION: Negative CTA head. Electronically Signed   By: CElon AlasM.D.   On: 04/25/2015 21:47   Dg Chest 2  View  04/25/2015  CLINICAL DATA:  Chest pain. Shortness of breath. Dizziness. Near syncope. EXAM: CHEST  2 VIEW COMPARISON:  None. FINDINGS: The  heart size and mediastinal contours are within normal limits. Both lungs are clear. The visualized skeletal structures are unremarkable. IMPRESSION: No active cardiopulmonary disease. Electronically Signed   By: Earle Gell M.D.   On: 04/25/2015 19:45   Ct Head Wo Contrast  04/25/2015  CLINICAL DATA:  Left leg weakness with left face and arm tingling. Dizziness. EXAM: CT HEAD WITHOUT CONTRAST TECHNIQUE: Contiguous axial images were obtained from the base of the skull through the vertex without intravenous contrast. COMPARISON:  None. FINDINGS: No mass lesion. No midline shift. No acute hemorrhage or hematoma. No extra-axial fluid collections. No evidence of acute infarction. Brain parenchyma is normal. Osseous structures are normal. IMPRESSION: Normal exam. Electronically Signed   By: Lorriane Shire M.D.   On: 04/25/2015 20:12    Review of Systems  Constitutional: Negative.   HENT: Negative.   Eyes: Negative.   Respiratory: Negative.   Cardiovascular: Negative.   Gastrointestinal: Negative.   Genitourinary: Negative.   Musculoskeletal: Negative.   Skin: Negative.   Neurological: Negative.   Psychiatric/Behavioral: Negative.    Blood pressure 122/65, pulse 64, temperature 98.3 F (36.8 C), temperature source Oral, resp. rate 18, height 6' 3"  (1.905 m), weight 131.452 kg (289 lb 12.8 oz), SpO2 94 %. Physical Exam  Nursing note and vitals reviewed. Constitutional: He is oriented to person, place, and time. He appears well-developed and well-nourished. No distress.  HENT:  Head: Normocephalic and atraumatic.  Right Ear: External ear normal.  Left Ear: External ear normal.  Nose: Nose normal.  Mouth/Throat: Oropharynx is clear and moist.  Eyes: Conjunctivae and EOM are normal. Pupils are equal, round, and reactive to light. No scleral icterus.  Neck: Normal range of motion. Neck supple.  Cardiovascular: Normal rate, regular rhythm, normal heart sounds and intact distal pulses.   No  murmur heard. Respiratory: Effort normal and breath sounds normal. No respiratory distress.  GI: Soft. Bowel sounds are normal. He exhibits no distension.  Musculoskeletal: Normal range of motion. He exhibits no edema or tenderness.  Neurological: He is alert and oriented to person, place, and time. He has normal reflexes. He displays normal reflexes. No cranial nerve deficit. He exhibits normal muscle tone. Coordination normal.  Skin: Skin is warm. He is not diaphoretic.  Psychiatric: He has a normal mood and affect.   CTA of head personally reviewed by me and normal  Assessment/Plan: 1.  L sided numbness-  There is more concern for cardiac source with symptoms than a neurologic source due to concurrent N/V, light-headedness and chest pain.  Can not rule out a seizure either which can have no descript symptoms too.  Highly doubt pure TIA but this is also possible. -  Start ASA 78m daily -  Needs better BP control with goal < 130/80 -  Pt encouraged to exercise -  Watch sugars -  Would consider cardiology consult -  Outpatient MRI of brain w/o contrast -  Will sign off, please call with questions -  Needs to f/u with Sean Mullins in 4-6 weeks  [place]11/19/2016, 1:28 PM

## 2015-04-26 NOTE — H&P (Signed)
Sean Mullins is an 64 y.o. male.   Chief Complaint: Slurred speech HPI: The patient presents emergency department complaining of slurred speech and some shortness of breath. He states the latter again gradually and was fairly unnoticeable until family members noticed that he was slurring his words. The patient is not clear when he was last speaking normally. In the emergency department he demonstrated some weakness of his left lower extremity. The patient was given aspirin and underwent a CT scan of the head showed no acute abnormality. Gradually his speech improved and his blood pressure normalized. Due to signs and symptoms of cerebrovascular accident the emergency department staff called for admission.  Past Medical History  Diagnosis Date  . Arthritis     Past Surgical History  Procedure Laterality Date  . Duodenal diverticulectomy      2003    History reviewed. No pertinent family history. Social History:  reports that he has quit smoking. He does not have any smokeless tobacco history on file. He reports that he drinks alcohol. His drug history is not on file.  Allergies: No Known Allergies  Medications Prior to Admission  Medication Sig Dispense Refill  . celecoxib (CELEBREX) 200 MG capsule Take 200 mg by mouth 2 (two) times daily as needed for moderate pain.    . potassium gluconate 595 MG TABS tablet Take 1,190 mg by mouth daily.      Results for orders placed or performed during the hospital encounter of 04/25/15 (from the past 48 hour(s))  Basic metabolic panel     Status: Abnormal   Collection Time: 04/25/15  6:36 PM  Result Value Ref Range   Sodium 136 135 - 145 mmol/L   Potassium 3.9 3.5 - 5.1 mmol/L   Chloride 103 101 - 111 mmol/L   CO2 24 22 - 32 mmol/L   Glucose, Bld 226 (H) 65 - 99 mg/dL   BUN 14 6 - 20 mg/dL   Creatinine, Ser 0.80 0.61 - 1.24 mg/dL   Calcium 9.2 8.9 - 10.3 mg/dL   GFR calc non Af Amer >60 >60 mL/min   GFR calc Af Amer >60 >60 mL/min   Comment: (NOTE) The eGFR has been calculated using the CKD EPI equation. This calculation has not been validated in all clinical situations. eGFR's persistently <60 mL/min signify possible Chronic Kidney Disease.    Anion gap 9 5 - 15  CBC     Status: None   Collection Time: 04/25/15  6:36 PM  Result Value Ref Range   WBC 5.9 3.8 - 10.6 K/uL   RBC 4.65 4.40 - 5.90 MIL/uL   Hemoglobin 15.1 13.0 - 18.0 g/dL   HCT 42.8 40.0 - 52.0 %   MCV 92.1 80.0 - 100.0 fL   MCH 32.5 26.0 - 34.0 pg   MCHC 35.3 32.0 - 36.0 g/dL   RDW 13.0 11.5 - 14.5 %   Platelets 193 150 - 440 K/uL  Troponin I     Status: None   Collection Time: 04/25/15  6:36 PM  Result Value Ref Range   Troponin I <0.03 <0.031 ng/mL    Comment:        NO INDICATION OF MYOCARDIAL INJURY.   Glucose, capillary     Status: Abnormal   Collection Time: 04/25/15  6:37 PM  Result Value Ref Range   Glucose-Capillary 217 (H) 65 - 99 mg/dL  Urinalysis complete, with microscopic ([facility] only)     Status: Abnormal   Collection Time: 04/25/15  8:15 PM  Result Value Ref Range   Color, Urine YELLOW (A) YELLOW   APPearance CLEAR (A) CLEAR   Glucose, UA NEGATIVE NEGATIVE mg/dL   Bilirubin Urine NEGATIVE NEGATIVE   Ketones, ur NEGATIVE NEGATIVE mg/dL   Specific Gravity, Urine 1.010 1.005 - 1.030   Hgb urine dipstick NEGATIVE NEGATIVE   pH 7.0 5.0 - 8.0   Protein, ur NEGATIVE NEGATIVE mg/dL   Nitrite NEGATIVE NEGATIVE   Leukocytes, UA NEGATIVE NEGATIVE   RBC / HPF 0-5 0 - 5 RBC/hpf   WBC, UA 0-5 0 - 5 WBC/hpf   Bacteria, UA NONE SEEN NONE SEEN   Squamous Epithelial / LPF NONE SEEN NONE SEEN   Mucous PRESENT    Ct Angio Head W/cm &/or Wo Cm  04/25/2015  CLINICAL DATA:  Hypoglycemic, status post candy bar. LEFT chest pressure, LEFT arm numbness and tingling. EXAM: CT ANGIOGRAPHY HEAD TECHNIQUE: Multidetector CT imaging of the head was performed using the standard protocol during bolus administration of intravenous contrast. Multiplanar  CT image reconstructions and MIPs were obtained to evaluate the vascular anatomy. CONTRAST:  110m OMNIPAQUE IOHEXOL 350 MG/ML SOLN COMPARISON:  CT head April 25, 2015 at 1955 hours FINDINGS: Anterior circulation: Normal appearance of the cervical internal carotid arteries, petrous, cavernous and supra clinoid internal carotid arteries. Diminutive versus absent anterior communicating artery. Normal appearance of the anterior and middle cerebral arteries. Posterior circulation: RIGHT vertebral artery is dominant with normal appearance of the vertebral arteries, vertebrobasilar junction and basilar artery, as well as main branch vessels. Robust RIGHT posterior communicating artery, small on the LEFT. Normal appearance of the posterior cerebral arteries. No large vessel occlusion, hemodynamically significant stenosis, dissection, luminal irregularity, contrast extravasation or aneurysm within the anterior nor posterior circulation. IMPRESSION: Negative CTA head. Electronically Signed   By: CElon AlasM.D.   On: 04/25/2015 21:47   Dg Chest 2 View  04/25/2015  CLINICAL DATA:  Chest pain. Shortness of breath. Dizziness. Near syncope. EXAM: CHEST  2 VIEW COMPARISON:  None. FINDINGS: The heart size and mediastinal contours are within normal limits. Both lungs are clear. The visualized skeletal structures are unremarkable. IMPRESSION: No active cardiopulmonary disease. Electronically Signed   By: JEarle GellM.D.   On: 04/25/2015 19:45   Ct Head Wo Contrast  04/25/2015  CLINICAL DATA:  Left leg weakness with left face and arm tingling. Dizziness. EXAM: CT HEAD WITHOUT CONTRAST TECHNIQUE: Contiguous axial images were obtained from the base of the skull through the vertex without intravenous contrast. COMPARISON:  None. FINDINGS: No mass lesion. No midline shift. No acute hemorrhage or hematoma. No extra-axial fluid collections. No evidence of acute infarction. Brain parenchyma is normal. Osseous structures  are normal. IMPRESSION: Normal exam. Electronically Signed   By: JLorriane ShireM.D.   On: 04/25/2015 20:12    Review of Systems  Constitutional: Negative for fever and chills.  HENT: Negative for sore throat and tinnitus.   Eyes: Negative for blurred vision and redness.  Respiratory: Negative for cough, sputum production and shortness of breath (resolved).   Cardiovascular: Negative for chest pain, palpitations, orthopnea and PND.  Gastrointestinal: Negative for nausea, vomiting, abdominal pain and diarrhea.  Genitourinary: Negative for dysuria, urgency and frequency.  Musculoskeletal: Negative for myalgias and joint pain.  Skin: Negative for rash.       No lesions  Neurological: Positive for speech change and focal weakness. Negative for weakness.  Endo/Heme/Allergies: Does not bruise/bleed easily.       No temperature  intolerance  Psychiatric/Behavioral: Negative for depression and suicidal ideas.    Blood pressure 155/79, pulse 64, temperature 97.4 F (36.3 C), temperature source Oral, resp. rate 18, height 6' 3" (1.905 m), weight 122.471 kg (270 lb), SpO2 98 %. Physical Exam  Nursing note and vitals reviewed. Constitutional: He is oriented to person, place, and time. He appears well-developed and well-nourished. No distress.  HENT:  Head: Normocephalic and atraumatic.  Mouth/Throat: Oropharynx is clear and moist.  Eyes: Conjunctivae and EOM are normal. Pupils are equal, round, and reactive to light. No scleral icterus.  Neck: Normal range of motion. Neck supple. No JVD present. No tracheal deviation present. No thyromegaly present.  Cardiovascular: Normal rate, regular rhythm and normal heart sounds.  Exam reveals no gallop and no friction rub.   No murmur heard. Respiratory: Effort normal and breath sounds normal.  GI: Soft. Bowel sounds are normal. He exhibits no distension. There is no tenderness.  Genitourinary:  Deferred  Musculoskeletal: Normal range of motion. He  exhibits no edema.  Lymphadenopathy:    He has no cervical adenopathy.  Neurological: He is alert and oriented to person, place, and time. No cranial nerve deficit.  Skin: Skin is warm and dry. No rash noted. No erythema.  Psychiatric: He has a normal mood and affect. His behavior is normal. Judgment and thought content normal.     Assessment/Plan This is a 64 year old Caucasian male admitted for CVA. 1. CVA/TIA: Unclear timing of onset of symptoms; resolved. No neurological deficits. Monitor telemetry. Neuro checks. Blood pressure within normal limits; permissive hypertension for perfusion. MRI/MRA ordered for the morning. Neurology consult ordered. Start aspirin and Plavix. 2. Arthritis: Hold Celebrex for now 3. Obesity: BMI is 33.8; encouraged healthy diet and exercise 4. DVT prophylaxis: Heparin 5. GI prophylaxis: None The patient is a full code. Time spent on admission was inpatient care proximally 45 minutes  Harrie Foreman 04/26/2015, 1:47 AM

## 2015-04-26 NOTE — Plan of Care (Signed)
Problem: Education: Goal: Knowledge of [facility] Education information/materials will improve Individualization: Admitted to ED with tingling in left hand and side of face-resolved at this time CT negative Medical Hx-arthritis  Outcome: Progressing Plan of care reviewed with pt. Pt verbalized understanding.  Problem: Education: Goal: Knowledge of secondary prevention will improve Outcome: Progressing Educated the pt about warning signs of stroke, symptoms of stroke and risk factors for stroke . Handouts given. Pt verbalized understanding.  Goal: Knowledge of patient specific risk factors addressed and post discharge goals established will improve Outcome: Progressing Educated pt about anticoagulants, handouts given.     Problem: Coping: Goal: Ability to identify strategies to decrease anxiety will improve Outcome: Progressing Family is very supportive.  VSS. Tylenol given once for headache with improvement. NIH score 0. No neuro changes during the shift. No neuro deficits noted. MRI not done, pt did not fit the MRI machine. Additional CT ordered. Possible discharge tonight post CT per MD.

## 2015-04-26 NOTE — Discharge Instructions (Signed)

## 2015-04-26 NOTE — Progress Notes (Signed)
Speech Therapy Note: received order, reviewed chart notes and consulted w/ NSG and pt/wife. Pt/wife reported his s/s of speech and numbness have resolved; pt communicated verbally at conversational level w/out difficulty and speech was 100% intelligible. Pt was eating his breakfast at the time w/ no s/s of dysphagia observed. Pt stated he feels at his baseline w/ no reports of deficits. No apparent need for ST services at this time. Pt/NSG agreed. NSG to reconsult if any change in status.

## 2015-04-26 NOTE — Evaluation (Signed)
Occupational Therapy Evaluation Patient Details Name: Sean Mullins MRN: [id number] DOB: 06/10/1950 Today's Date: 04/26/2015    History of Present Illness Pt is 64 yr old gentleman admitted with symptoms of slurred speech , L LE weakness  on 18Nov - admitted thru ER because of Stroke like syptoms    Clinical Impression   Pt present at eval with no weakness, or coordination issues - report no LOB during functional mobility - just tired after walking with PT - did discuss with pt cont to walk at home and work on endurance and getting in habit - also pt report working long hours and over time lately - and tired the last few wks or months- talked about reevaluating work schedule - hours - Stress?     Follow Up Recommendations  Other (comment) (Discuss with pt to start walking, gradually increase,  reassess work load  and schedule )    Equipment Recommendations       Recommendations for Other Services       Precautions / Restrictions Precautions Precautions: None Restrictions Weight Bearing Restrictions: No      Mobility Bed Mobility Overal bed mobility: Independent             General bed mobility comments: Patient independent in all aspects of bed mobility, rolling and moving from side to sit without assistance.  Transfers Overall transfer level: Independent Equipment used: None             General transfer comment: Patient transfers from sit to stand and stand to sit with no dizziness/LOB.    Balance Overall balance assessment: Independent                                          ADL                                               Vision     Perception     Praxis      Pertinent Vitals/Pain Pain Assessment: No/denies pain     Hand Dominance     Extremity/Trunk Assessment Upper Extremity Assessment Upper Extremity Assessment: Overall WFL for tasks assessed   Lower Extremity Assessment Lower Extremity  Assessment: Overall WFL for tasks assessed       Communication Communication Communication: No difficulties   Cognition Arousal/Alertness: Awake/alert Behavior During Therapy: WFL for tasks assessed/performed Overall Cognitive Status: Within Functional Limits for tasks assessed                     General Comments       Exercises   Other Exercises Other Exercises: Eye hand coordination and diadochokinesis WNL , signature and writing back to WNL , using utencils with breakfast WNL - no dropping or reports of numnbess  Other Exercises: Bil UE strength WNL - AROM WNL - R hand domiant but use L hand in a lot of activities  Other Exercises: Functional mobility in ADL's WNL and no LOB    Shoulder Instructions      Home Living Family/patient expects to be discharged to:: Private residence Living Arrangements: Spouse/significant other Available Help at Discharge: Family Type of Home: House       Home Layout: One level     Bathroom Shower/Tub:  Tub/shower unit   Bathroom Toilet: Standard     Home Equipment: None          Prior Functioning/Environment Level of Independence: Independent        Comments: Work long hours in [place] and work a lot of over time at [employer] in [place]    OT Diagnosis:     OT Problem List:     OT Treatment/Interventions:      OT Goals(Current goals can be found in the care plan section) Acute Rehab OT Goals Patient Stated Goal: "To go home."  OT Frequency:     Barriers to D/C:            Co-evaluation              End of Session    Activity Tolerance:   Patient left:     Time: [id number] OT Time Calculation (min): 21 min Charges:  OT General Charges $OT Visit: 1 Procedure OT Evaluation $Initial OT Evaluation Tier I: 1 Procedure G-Codes:    Rosalyn Gess OTR/L,CLT 04/26/2015, 10:16 AM

## 2015-04-26 NOTE — ED Notes (Signed)
MD Diamond at bedside at this time.  

## 2015-04-27 NOTE — Discharge Summary (Signed)
[facility] [facility] [place] NAME: Sean Mullins    MR#:  [id number]  DATE OF BIRTH:  June 26, 1950  DATE OF ADMISSION:  04/25/2015 ADMITTING PHYSICIAN: Harrie Foreman, MD  DATE OF DISCHARGE: 04/26/2015  9:45 PM  PRIMARY CARE PHYSICIAN: Noralee Space, MD    ADMISSION DIAGNOSIS:  Chest pain, unspecified [R07.9] Dizziness [R42] Left leg weakness [R29.898] Left face and left arm tingling [R20.2]  DISCHARGE DIAGNOSIS:  Active Problems:   CVA (cerebral infarction)   SECONDARY DIAGNOSIS:   Past Medical History  Diagnosis Date  . Arthritis      ADMITTING HISTORY  The patient presents emergency department complaining of slurred speech and some shortness of breath. He states the latter again gradually and was fairly unnoticeable until family members noticed that he was slurring his words. The patient is not clear when he was last speaking normally. In the emergency department he demonstrated some weakness of his left lower extremity. The patient was given aspirin and underwent a CT scan of the head showed no acute abnormality. Gradually his speech improved and his blood pressure normalized. Due to signs and symptoms of cerebrovascular accident the emergency department staff called for admission.   HOSPITAL COURSE:  1. TIA Admitted to Med floor with tele. Neuro checks. ASA, statin were started. Echo was normal. LDL - 100. Symptoms resolved by the time of admission. Did not need PT/speech services at discharge. Seen by neurology. MRI could not be done as patient did not fit into the MRI machine. CT head repeated in 24 hrs without anything acute. CTA head showed no significant findings. Discussed with patient and family multiple times regarding patients findings and diagnosis of TIA. Answered all questions . Spoke with patient, Wife and his children.  2. Glucose intolerance HbA1c - 6.5 Patient mentions he gets hypoglycemic symptoms ven  when blood sugars are close to 90 which his PCP is aware of. Recommended lifestyle changes and f/u with PCP. No meds started.  3. Started on statin  Stable for discharge home. Needs OP open MRI.   CONSULTS OBTAINED:  Treatment Team:  Valora Corporal, MD  DRUG ALLERGIES:  No Known Allergies  DISCHARGE MEDICATIONS:   Discharge Medication List as of 04/26/2015  9:19 PM    START taking these medications   Details  aspirin EC 81 MG tablet Take 1 tablet (81 mg total) by mouth daily., Starting 04/26/2015, Until Discontinued, OTC    atorvastatin (LIPITOR) 10 MG tablet Take 1 tablet (10 mg total) by mouth daily., Starting 04/26/2015, Until Discontinued, Normal      CONTINUE these medications which have NOT CHANGED   Details  celecoxib (CELEBREX) 200 MG capsule Take 200 mg by mouth 2 (two) times daily as needed for moderate pain., Until Discontinued, Historical Med    potassium gluconate 595 MG TABS tablet Take 1,190 mg by mouth daily., Until Discontinued, Historical Med         Today    VITAL SIGNS:  Blood pressure 168/87, pulse 73, temperature 97.9 F (36.6 C), temperature source Oral, resp. rate 20, height 6\' 3"  (1.905 m), weight 131.452 kg (289 lb 12.8 oz), SpO2 92 %.  I/O:   Intake/Output Summary (Last 24 hours) at 04/27/15 0717 Last data filed at 04/26/15 1700  Gross per 24 hour  Intake    720 ml  Output      0 ml  Net    720 ml    PHYSICAL EXAMINATION:  Physical Exam  GENERAL:  64 y.o.-year-old patient lying in the bed with no acute distress.  LUNGS: Normal breath sounds bilaterally, no wheezing, rales,rhonchi or crepitation. No use of accessory muscles of respiration.  CARDIOVASCULAR: S1, S2 normal. No murmurs, rubs, or gallops.  NEUROLOGIC: Motor 5/5. Sensations normal PSYCHIATRIC: The patient is alert and oriented x 3.   DATA REVIEW:   CBC  Recent Labs Lab 04/25/15 1836  WBC 5.9  HGB 15.1  HCT 42.8  PLT 193    Chemistries   Recent Labs Lab  04/25/15 1836  NA 136  K 3.9  CL 103  CO2 24  GLUCOSE 226*  BUN 14  CREATININE 0.80  CALCIUM 9.2    Cardiac Enzymes  Recent Labs Lab 04/25/15 1836  TROPONINI <0.03    Microbiology Results  No results found for this or any previous visit.  RADIOLOGY:  Ct Angio Head W/cm &/or Wo Cm  04/25/2015  CLINICAL DATA:  Hypoglycemic, status post candy bar. LEFT chest pressure, LEFT arm numbness and tingling. EXAM: CT ANGIOGRAPHY HEAD TECHNIQUE: Multidetector CT imaging of the head was performed using the standard protocol during bolus administration of intravenous contrast. Multiplanar CT image reconstructions and MIPs were obtained to evaluate the vascular anatomy. CONTRAST:  121mL OMNIPAQUE IOHEXOL 350 MG/ML SOLN COMPARISON:  CT head April 25, 2015 at 1955 hours FINDINGS: Anterior circulation: Normal appearance of the cervical internal carotid arteries, petrous, cavernous and supra clinoid internal carotid arteries. Diminutive versus absent anterior communicating artery. Normal appearance of the anterior and middle cerebral arteries. Posterior circulation: RIGHT vertebral artery is dominant with normal appearance of the vertebral arteries, vertebrobasilar junction and basilar artery, as well as main branch vessels. Robust RIGHT posterior communicating artery, small on the LEFT. Normal appearance of the posterior cerebral arteries. No large vessel occlusion, hemodynamically significant stenosis, dissection, luminal irregularity, contrast extravasation or aneurysm within the anterior nor posterior circulation. IMPRESSION: Negative CTA head. Electronically Signed   By: Elon Alas M.D.   On: 04/25/2015 21:47   Dg Chest 2 View  04/25/2015  CLINICAL DATA:  Chest pain. Shortness of breath. Dizziness. Near syncope. EXAM: CHEST  2 VIEW COMPARISON:  None. FINDINGS: The heart size and mediastinal contours are within normal limits. Both lungs are clear. The visualized skeletal structures are  unremarkable. IMPRESSION: No active cardiopulmonary disease. Electronically Signed   By: Earle Gell M.D.   On: 04/25/2015 19:45   Ct Head Wo Contrast  04/26/2015  CLINICAL DATA:  Patient with slurred speech. Left lower extremity weakness. EXAM: CT HEAD WITHOUT CONTRAST TECHNIQUE: Contiguous axial images were obtained from the base of the skull through the vertex without intravenous contrast. COMPARISON:  Head CT 04/25/2015 FINDINGS: Ventricles and sulci are appropriate for patient's age. No evidence for acute cortically based infarct, intracranial hemorrhage, mass lesion or mass-effect. Orbits are unremarkable. Paranasal sinuses are unremarkable. Polypoid mucosal thickening left maxillary sinus. Mastoid air cells unremarkable. Calvarium is intact. IMPRESSION: No acute intracranial process. Electronically Signed   By: Lovey Newcomer M.D.   On: 04/26/2015 19:43   Ct Head Wo Contrast  04/25/2015  CLINICAL DATA:  Left leg weakness with left face and arm tingling. Dizziness. EXAM: CT HEAD WITHOUT CONTRAST TECHNIQUE: Contiguous axial images were obtained from the base of the skull through the vertex without intravenous contrast. COMPARISON:  None. FINDINGS: No mass lesion. No midline shift. No acute hemorrhage or hematoma. No extra-axial fluid collections. No evidence of acute infarction. Brain parenchyma is normal. Osseous structures are normal. IMPRESSION: Normal exam.  Electronically Signed   By: Lorriane Shire M.D.   On: 04/25/2015 20:12    Follow up with PCP in 1 week.  Management plans discussed with the patient, family and they are in agreement.  CODE STATUS:   TOTAL TIME TAKING CARE OF THIS PATIENT ON DAY OF DISCHARGE: more than 30 minutes.    Hillary Bow R M.D on 04/27/2015 at 7:17 AM  Between 7am to 6pm - Pager - [phone]  After 6pm go to [email or web address] - password [email or web address] [facility]  Office  [phone]  CC: Primary care physician; Noralee Space,  MD   Note: This dictation was prepared with Dragon dictation along with smaller phrase technology. Any transcriptional errors that result from this process are unintentional.

## 2015-04-29 ENCOUNTER — Ambulatory Visit (INDEPENDENT_AMBULATORY_CARE_PROVIDER_SITE_OTHER): Payer: BLUE CROSS/BLUE SHIELD | Admitting: Internal Medicine

## 2015-04-29 ENCOUNTER — Encounter: Payer: Self-pay | Admitting: Internal Medicine

## 2015-04-29 VITALS — BP 138/76 | HR 72 | Temp 97.5°F | Resp 16 | Ht 75.0 in | Wt 288.0 lb

## 2015-04-29 DIAGNOSIS — I10 Essential (primary) hypertension: Secondary | ICD-10-CM | POA: Diagnosis not present

## 2015-04-29 DIAGNOSIS — G459 Transient cerebral ischemic attack, unspecified: Secondary | ICD-10-CM

## 2015-04-29 DIAGNOSIS — E785 Hyperlipidemia, unspecified: Secondary | ICD-10-CM | POA: Diagnosis not present

## 2015-04-29 DIAGNOSIS — E119 Type 2 diabetes mellitus without complications: Secondary | ICD-10-CM

## 2015-04-29 DIAGNOSIS — Z8673 Personal history of transient ischemic attack (TIA), and cerebral infarction without residual deficits: Secondary | ICD-10-CM | POA: Insufficient documentation

## 2015-04-29 DIAGNOSIS — I152 Hypertension secondary to endocrine disorders: Secondary | ICD-10-CM | POA: Insufficient documentation

## 2015-04-29 DIAGNOSIS — E1169 Type 2 diabetes mellitus with other specified complication: Secondary | ICD-10-CM | POA: Insufficient documentation

## 2015-04-29 MED ORDER — BLOOD GLUCOSE MONITOR KIT: PACK | Status: DC

## 2015-04-29 MED ORDER — ROSUVASTATIN CALCIUM 10 MG PO TABS: 10.0000 mg | ORAL_TABLET | Freq: Every day | ORAL | Status: DC

## 2015-04-29 MED ORDER — LISINOPRIL 10 MG PO TABS: 10.0000 mg | ORAL_TABLET | Freq: Every day | ORAL | Status: DC

## 2015-04-29 NOTE — Assessment & Plan Note (Signed)
His cholesterol has been fairly controlled, but he technically has diabetes and recently had a TIA-needs to be on a statin  increase exercise, work on weight loss Continue to heart healthy diet Start Crestor 10 mg daily Recheck lipid panel, CMP in 6 weeks

## 2015-04-29 NOTE — Assessment & Plan Note (Signed)
Goal less than 130/80 Has been elevated Start lisinopril 10 mg daily Low-sodium diet, regular exercise and work on weight loss Continue to monitor at home Follow up in 6 weeks

## 2015-04-29 NOTE — Progress Notes (Signed)
Subjective:    Patient ID: Sean Mullins, male    DOB: August 18, 1950, 64 y.o.   MRN: [id number]  HPI He is here to establish with a new pcp.  He is here for hospital follow up.  Last Friday afternoon he felt hypoglycemic.  He is a borderline diabetic and has had hypoglycemia in the past.  He felt nauseous and dizzy.   He ate a candy bar and he still felt the same - usually his symptoms resolve.  On his way home couple of hours later he stopped at a light.  He felt nausea, had chest tightness, left arm nubmness and he felt more dizzy.  He does not remember driving the rest of the 10-15 minutes home. He felt disorientated.Marland Kitchen  He leaned across the counter.  He felt everything spinning and he fell.  His wife called EMS and his sugar was 186 and EKG was normal.  They took him to the ED.    In ED he had numbness/tingling/weakness in left arm and hand.  He had left leg numbness/tingling/weakness.  The left side of his face felt strange.  He had left sided weakness.  After a few hours his symptoms resolved. He feels drained since then, but denies any numbness, tingling or weakness of his arms and legs.  Yesterday when he went to the grocery store and was walking around he did have some dizziness.    Recent blood work in the emergency room showed his LDL cholesterol to be 100, HDL 34, triglycerides 106 and total cholesterol 155. His A1c was 6.6.  EKG was unremarkable. CT of the head was normal. Echocardiogram was normal. CTA of the head was normal. An ultrasound of the carotid arteries showed 50-69% stenosis bilaterally. Chest x-ray was normal.  He was advised that he had a TIA. He was prescribed Lipitor, but did not start taking it. He has heard about side effects related to Lipitor and causing diabetes and he does not want to take it. He has checked his blood pressure at home and it was in the 140s/80s yesterday.  He typically eats fairly healthy. He has not been exercising regularly. He knows he is  overweight and needs to lose weight.    Medications and allergies reviewed with patient and updated if appropriate.  Patient Active Problem List   Diagnosis Date Noted  . CVA (cerebral infarction) 04/26/2015  . HYPERTENSION, BORDERLINE 08/12/2009  . NOCTURIA 08/12/2009  . HYPERCHOLESTEROLEMIA, BORDERLINE 05/17/2008  . OVERWEIGHT 05/17/2008  . OBSTRUCTIVE SLEEP APNEA 05/17/2008  . RESTLESS LEG SYNDROME 05/17/2008  . CAD 05/17/2008  . DIVERTICULITIS, COLON 05/17/2008  . DEGENERATIVE JOINT DISEASE 05/17/2008    Current Outpatient Prescriptions on File Prior to Visit  Medication Sig Dispense Refill  . aspirin EC 81 MG tablet Take 1 tablet (81 mg total) by mouth daily. 30 tablet 0  . celecoxib (CELEBREX) 200 MG capsule Take 200 mg by mouth 2 (two) times daily as needed for moderate pain.    . potassium gluconate 595 MG TABS tablet Take 1,190 mg by mouth daily.     No current facility-administered medications on file prior to visit.    Past Medical History  Diagnosis Date  . Arthritis     Past Surgical History  Procedure Laterality Date  . Duodenal diverticulectomy      2003    Social History   Social History  . Marital Status: Married    Spouse Name: N/A  . Number of Children: N/A  .  Years of Education: N/A   Social History Main Topics  . Smoking status: Former Research scientist (life sciences)  . Smokeless tobacco: None  . Alcohol Use: Yes     Comment: ocassional   . Drug Use: None  . Sexual Activity: Not Asked   Other Topics Concern  . None   Social History Narrative    Review of Systems  Constitutional: Negative for fever, chills and fatigue.  HENT: Positive for congestion. Negative for sore throat and trouble swallowing.   Eyes: Negative for visual disturbance.  Respiratory: Positive for cough (since being in the hospital) and shortness of breath (friday only). Negative for wheezing.   Cardiovascular: Negative for chest pain, palpitations and leg swelling.  Gastrointestinal:  Negative for nausea and abdominal pain.       No GERD  Neurological: Positive for headaches (friday  - severe headache). Negative for dizziness, speech difficulty, weakness, light-headedness and numbness.       Objective:   Filed Vitals:   04/29/15 1305  BP: 138/76  Pulse: 72  Temp: 97.5 F (36.4 C)  Resp: 16   Filed Weights   04/29/15 1305  Weight: 288 lb (130.636 kg)   Body mass index is 36 kg/(m^2).   Physical Exam  Constitutional: He is oriented to person, place, and time. He appears well-developed and well-nourished. No distress.  HENT:  Head: Normocephalic and atraumatic.  Right Ear: External ear normal.  Left Ear: External ear normal.  Mouth/Throat: Oropharynx is clear and moist.  Eyes: Conjunctivae are normal.  Neck: Neck supple. No JVD present. No tracheal deviation present. No thyromegaly present.  no carotid bruit bilaterally  Cardiovascular: Normal rate, regular rhythm and normal heart sounds.   No murmur heard. Pulmonary/Chest: Effort normal and breath sounds normal. No respiratory distress. He has no wheezes. He has no rales.  Abdominal: Soft. He exhibits no distension. There is no tenderness.  Musculoskeletal: He exhibits no edema.  Lymphadenopathy:    He has no cervical adenopathy.  Neurological: He is alert and oriented to person, place, and time. No cranial nerve deficit.  Normal sensation and strength all extremities, gait normal  Skin: Skin is warm and dry. He is not diaphoretic.  Psychiatric: He has a normal mood and affect. His behavior is normal.          Assessment & Plan:   See problem list for assessment and plan  Follow-up in 6 weeks

## 2015-04-29 NOTE — Assessment & Plan Note (Signed)
Discussed blood pressure goal less than 1:30/80 Start Cipro 10 mg daily Start Crestor 10 mg daily Continue aspirin 81 mg daily Recheck lipid panel, A1c in 6 weeks Stressed regular exercise and weight loss We'll refer to neurology for further evaluation MRA of the neck, MRI of head ordered

## 2015-04-29 NOTE — Progress Notes (Signed)
Pre visit review using our clinic review tool, if applicable. No additional management support is needed unless otherwise documented below in the visit note. 

## 2015-04-29 NOTE — Assessment & Plan Note (Signed)
Recent A1c 6.6% New Diagnosis He eats a fairly healthy diet Increase exercise and work on weight loss Recheck A1c in 6 weeks We'll discuss referring him to a nutritionist at his next visit Glucometer sent to pharmacy so he can monitor his sugar

## 2015-04-29 NOTE — Patient Instructions (Signed)
  We have reviewed your prior records including labs and tests today.  Test(s) ordered today. Your results will be released to [facility] (or called to you) after review, usually within 72hours after test completion. If any changes need to be made, you will be notified at that same time.  All other Health Maintenance issues reviewed.   All recommended immunizations and age-appropriate screenings are up-to-date.  No immunizations administered today.   Medications reviewed and updated.. Start lisinopril for your blood pressure and crestor for your cholesterol.    Your prescription(s) have been submitted to your pharmacy. Please take as directed and contact our office if you believe you are having problem(s) with the medication(s).  A referral for neurology  An MRI was ordered.    Please schedule followup in 6 weeks

## 2015-04-30 ENCOUNTER — Other Ambulatory Visit (INDEPENDENT_AMBULATORY_CARE_PROVIDER_SITE_OTHER): Payer: BLUE CROSS/BLUE SHIELD

## 2015-04-30 DIAGNOSIS — G459 Transient cerebral ischemic attack, unspecified: Secondary | ICD-10-CM | POA: Diagnosis not present

## 2015-04-30 LAB — COMPREHENSIVE METABOLIC PANEL
ALT: 62 U/L — ABNORMAL HIGH (ref 0–53)
AST: 41 U/L — AB (ref 0–37)
Albumin: 3.9 g/dL (ref 3.5–5.2)
Alkaline Phosphatase: 62 U/L (ref 39–117)
BUN: 14 mg/dL (ref 6–23)
CALCIUM: 9.5 mg/dL (ref 8.4–10.5)
CHLORIDE: 104 meq/L (ref 96–112)
CO2: 26 meq/L (ref 19–32)
Creatinine, Ser: 0.96 mg/dL (ref 0.40–1.50)
GFR: 84.3 mL/min (ref >60.00)
GLUCOSE: 146 mg/dL — AB (ref 70–99)
Potassium: 4.4 mEq/L (ref 3.5–5.1)
Sodium: 138 mEq/L (ref 135–145)
Total Bilirubin: 1.1 mg/dL (ref 0.2–1.2)
Total Protein: 7 g/dL (ref 6.0–8.3)

## 2015-04-30 LAB — LIPID PANEL
CHOL/HDL RATIO: 4
CHOLESTEROL: 125 mg/dL (ref 0–200)
HDL: 33.9 mg/dL — ABNORMAL LOW (ref >39.00)
LDL CALC: 67 mg/dL (ref 0–99)
NonHDL: 90.74
TRIGLYCERIDES: 119 mg/dL (ref 0.0–149.0)
VLDL: 23.8 mg/dL (ref 0.0–40.0)

## 2015-04-30 LAB — HEMOGLOBIN A1C: Hgb A1c MFr Bld: 6.6 % — ABNORMAL HIGH (ref 4.6–6.5)

## 2015-05-16 ENCOUNTER — Other Ambulatory Visit: Payer: Self-pay | Admitting: Internal Medicine

## 2015-05-16 DIAGNOSIS — Z139 Encounter for screening, unspecified: Secondary | ICD-10-CM

## 2015-05-27 ENCOUNTER — Ambulatory Visit
Admission: RE | Admit: 2015-05-27 | Discharge: 2015-05-27 | Disposition: A | Discharge status: Home / Self Care | Payer: BLUE CROSS/BLUE SHIELD | Source: Ambulatory Visit | Attending: Internal Medicine | Admitting: Internal Medicine

## 2015-05-27 DIAGNOSIS — G459 Transient cerebral ischemic attack, unspecified: Secondary | ICD-10-CM

## 2015-05-27 DIAGNOSIS — Z139 Encounter for screening, unspecified: Secondary | ICD-10-CM

## 2015-05-27 MED ORDER — GADOBENATE DIMEGLUMINE 529 MG/ML IV SOLN
20.0000 mL | Freq: Once | INTRAVENOUS | Status: AC | PRN
  Administered 2015-05-27: 20 mL via INTRAVENOUS

## 2015-06-10 ENCOUNTER — Encounter: Payer: Self-pay | Admitting: Internal Medicine

## 2015-06-10 ENCOUNTER — Ambulatory Visit (INDEPENDENT_AMBULATORY_CARE_PROVIDER_SITE_OTHER): Payer: BLUE CROSS/BLUE SHIELD | Admitting: Internal Medicine

## 2015-06-10 ENCOUNTER — Other Ambulatory Visit (INDEPENDENT_AMBULATORY_CARE_PROVIDER_SITE_OTHER): Payer: BLUE CROSS/BLUE SHIELD

## 2015-06-10 VITALS — BP 144/76 | HR 64 | Temp 97.9°F | Resp 16 | Wt 282.0 lb

## 2015-06-10 DIAGNOSIS — I10 Essential (primary) hypertension: Secondary | ICD-10-CM

## 2015-06-10 DIAGNOSIS — G459 Transient cerebral ischemic attack, unspecified: Secondary | ICD-10-CM

## 2015-06-10 DIAGNOSIS — E119 Type 2 diabetes mellitus without complications: Secondary | ICD-10-CM | POA: Diagnosis not present

## 2015-06-10 LAB — COMPREHENSIVE METABOLIC PANEL
ALT: 43 U/L (ref 0–53)
AST: 30 U/L (ref 0–37)
Albumin: 4.2 g/dL (ref 3.5–5.2)
Alkaline Phosphatase: 60 U/L (ref 39–117)
BUN: 13 mg/dL (ref 6–23)
CHLORIDE: 104 meq/L (ref 96–112)
CO2: 29 meq/L (ref 19–32)
Calcium: 9.6 mg/dL (ref 8.4–10.5)
Creatinine, Ser: 0.8 mg/dL (ref 0.40–1.50)
GFR: 104.01 mL/min (ref >60.00)
GLUCOSE: 106 mg/dL — AB (ref 70–99)
POTASSIUM: 4.1 meq/L (ref 3.5–5.1)
SODIUM: 139 meq/L (ref 135–145)
TOTAL PROTEIN: 7.1 g/dL (ref 6.0–8.3)
Total Bilirubin: 1 mg/dL (ref 0.2–1.2)

## 2015-06-10 LAB — HEMOGLOBIN A1C: Hgb A1c MFr Bld: 6 % (ref 4.6–6.5)

## 2015-06-10 NOTE — Patient Instructions (Signed)
  We have reviewed your prior records including labs and tests today.  Test(s) ordered today. Your results will be released to [facility] (or called to you) after review, usually within 72hours after test completion. If any changes need to be made, you will be notified at that same time.  Medications reviewed and updated.  No changes recommended at this time.  Please schedule followup in 4 months

## 2015-06-10 NOTE — Progress Notes (Signed)
Subjective:    Patient ID: Sean Mullins, male    DOB: [birth year], 65 y.o.   MRN: [id number]  HPI He is here for follow up.   He has decreased his alcohol intake and has lowered his sugar and carbohydrate intake.  He is not eating as much processed meat.  He is eating more fish and vegetables.  He has lost 6 lbs.  He is trying to walk during his lunch hour - he walks for 30 minutes.  He does this several times a week.    Hypertension: He is taking his medication daily. He is compliant with a low sodium diet.  He denies chest pain, palpitations, edema, shortness of breath and regular headaches. He is exercising regularly.  He does monitor his blood pressure at home  -123/67 - 148/79.    TIA:  He is not quite at his baseline.  His overall energy level is still low and he still has overall weakness.  He denies focal weakness, headaches, numbness/tingling.    Diabetes: He is working on lifestyle changes and not on any medication. He is compliant with a diabetic diet. He is exercising regularly. He has not been monitoring his sugars at home.      Medications and allergies reviewed with patient and updated if appropriate.  Patient Active Problem List   Diagnosis Date Noted  . Essential hypertension 04/29/2015  . TIA (transient ischemic attack) 04/29/2015  . Hyperlipidemia 04/29/2015  . Diabetes ([place]) 04/29/2015  . NOCTURIA 08/12/2009  . HYPERCHOLESTEROLEMIA, BORDERLINE 05/17/2008  . OVERWEIGHT 05/17/2008  . OBSTRUCTIVE SLEEP APNEA 05/17/2008  . RESTLESS LEG SYNDROME 05/17/2008  . CAD 05/17/2008  . DIVERTICULITIS, COLON 05/17/2008  . DEGENERATIVE JOINT DISEASE 05/17/2008    Current Outpatient Prescriptions on File Prior to Visit  Medication Sig Dispense Refill  . aspirin EC 81 MG tablet Take 1 tablet (81 mg total) by mouth daily. 30 tablet 0  . blood glucose meter kit and supplies KIT Dispense based on patient and insurance preference. Use daily and as needed.  NIDDM 1 each 0    . celecoxib (CELEBREX) 200 MG capsule Take 200 mg by mouth 2 (two) times daily as needed for moderate pain.    Marland Kitchen lisinopril (PRINIVIL,ZESTRIL) 10 MG tablet Take 1 tablet (10 mg total) by mouth daily. 30 tablet 0  . potassium gluconate 595 MG TABS tablet Take 1,190 mg by mouth daily.    . rosuvastatin (CRESTOR) 10 MG tablet Take 1 tablet (10 mg total) by mouth daily. 30 tablet 0   No current facility-administered medications on file prior to visit.    Past Medical History  Diagnosis Date  . Arthritis     Past Surgical History  Procedure Laterality Date  . Duodenal diverticulectomy      2003    Social History   Social History  . Marital Status: Married    Spouse Name: N/A  . Number of Children: N/A  . Years of Education: N/A   Social History Main Topics  . Smoking status: Former Smoker -- 1.50 packs/day for 30 years    Quit date: 06/07/1996  . Smokeless tobacco: Not on file  . Alcohol Use: Yes     Comment: ocassional   . Drug Use: Not on file  . Sexual Activity: Not on file   Other Topics Concern  . Not on file   Social History Narrative   No regular exercise    Review of Systems  Respiratory: Negative for cough,  shortness of breath and wheezing.   Cardiovascular: Negative for chest pain, palpitations and leg swelling.  Neurological: Positive for dizziness (if stand up quickly). Negative for weakness, light-headedness, numbness and headaches.       Objective:   Filed Vitals:   06/10/15 1338  BP: 144/76  Pulse: 64  Temp: 97.9 F (36.6 C)  Resp: 16   Filed Weights   06/10/15 1338  Weight: 282 lb (127.914 kg)   Body mass index is 35.25 kg/(m^2).   Physical Exam Constitutional: Appears well-developed and well-nourished. No distress.  Neck: Neck supple. No tracheal deviation present. No thyromegaly present.  No carotid bruit. No cervical adenopathy.   Cardiovascular: Normal rate, regular rhythm and normal heart sounds.   No murmur  heard. Pulmonary/Chest: Effort normal and breath sounds normal. No respiratory distress. No wheezes.  Musculoskeletal: No edema.        Assessment & Plan:   See Problem List for A/P  Follow up in 4 months

## 2015-06-10 NOTE — Assessment & Plan Note (Signed)
Recheck a1c today Exercising, compliant with diabetic diet and working on weight loss Follow up in 4 months

## 2015-06-10 NOTE — Assessment & Plan Note (Signed)
Fairly controlled, but occasionally still elevated Continue to work on lifestyle - weight loss, exercise and low sodium diet Continue to monitor BP at home Continue lisinopril 10 mg daily

## 2015-06-10 NOTE — Assessment & Plan Note (Signed)
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

## 2015-06-10 NOTE — Progress Notes (Signed)
Pre visit review using our clinic review tool, if applicable. No additional management support is needed unless otherwise documented below in the visit note. 

## 2015-06-11 ENCOUNTER — Ambulatory Visit (INDEPENDENT_AMBULATORY_CARE_PROVIDER_SITE_OTHER): Payer: BLUE CROSS/BLUE SHIELD | Admitting: Neurology

## 2015-06-11 ENCOUNTER — Encounter: Payer: Self-pay | Admitting: Neurology

## 2015-06-11 VITALS — BP 140/68 | HR 72 | Ht 72.0 in | Wt 281.0 lb

## 2015-06-11 DIAGNOSIS — G459 Transient cerebral ischemic attack, unspecified: Secondary | ICD-10-CM | POA: Diagnosis not present

## 2015-06-11 NOTE — Patient Instructions (Signed)
1.  Continue aspirin 81mg  daily 2.  Continue crestor 10mg  daily.   3.  Continue lisinopril and blood pressure control 4.  Continue daily 30 minute walks 5.  Mediterranean diet    Why follow it? Research shows. . Those who follow the Mediterranean diet have a reduced risk of heart disease  . The diet is associated with a reduced incidence of Parkinson's and Alzheimer's diseases . People following the diet may have longer life expectancies and lower rates of chronic diseases  . The Dietary Guidelines for Americans recommends the Mediterranean diet as an eating plan to promote health and prevent disease  What Is the Mediterranean Diet?  . Healthy eating plan based on typical foods and recipes of Mediterranean-style cooking . The diet is primarily a plant based diet; these foods should make up a majority of meals   Starches - Plant based foods should make up a majority of meals - They are an important sources of vitamins, minerals, energy, antioxidants, and fiber - Choose whole grains, foods high in fiber and minimally processed items  - Typical grain sources include wheat, oats, barley, corn, brown rice, bulgar, farro, millet, polenta, couscous  - Various types of beans include chickpeas, lentils, fava beans, black beans, white beans   Fruits  Veggies - Large quantities of antioxidant rich fruits & veggies; 6 or more servings  - Vegetables can be eaten raw or lightly drizzled with oil and cooked  - Vegetables common to the traditional Mediterranean Diet include: artichokes, arugula, beets, broccoli, brussel sprouts, cabbage, carrots, celery, collard greens, cucumbers, eggplant, kale, leeks, lemons, lettuce, mushrooms, okra, onions, peas, peppers, potatoes, pumpkin, radishes, rutabaga, shallots, spinach, sweet potatoes, turnips, zucchini - Fruits common to the Mediterranean Diet include: apples, apricots, avocados, cherries, clementines, dates, figs, grapefruits, grapes, melons, nectarines,  oranges, peaches, pears, pomegranates, strawberries, tangerines  Fats - Replace butter and margarine with healthy oils, such as olive oil, canola oil, and tahini  - Limit nuts to no more than a handful a day  - Nuts include walnuts, almonds, pecans, pistachios, pine nuts  - Limit or avoid candied, honey roasted or heavily salted nuts - Olives are central to the [employer] - can be eaten whole or used in a variety of dishes   Meats Protein - Limiting red meat: no more than a few times a month - When eating red meat: choose lean cuts and keep the portion to the size of deck of cards - Eggs: approx. 0 to 4 times a week  - Fish and lean poultry: at least 2 a week  - Healthy protein sources include, chicken, [place], lean beef, lamb - Increase intake of seafood such as tuna, salmon, trout, mackerel, shrimp, scallops - Avoid or limit high fat processed meats such as sausage and bacon  Dairy - Include moderate amounts of low fat dairy products  - Focus on healthy dairy such as fat free yogurt, skim milk, low or reduced fat cheese - Limit dairy products higher in fat such as whole or 2% milk, cheese, ice cream  Alcohol - Moderate amounts of red wine is ok  - No more than 5 oz daily for women (all ages) and men older than age 68  - No more than 10 oz of wine daily for men younger than 79  Other - Limit sweets and other desserts  - Use herbs and spices instead of salt to flavor foods  - Herbs and spices common to the traditional Mediterranean Diet include:  basil, bay leaves, chives, cloves, cumin, fennel, garlic, lavender, marjoram, mint, oregano, parsley, pepper, rosemary, sage, savory, sumac, tarragon, thyme   It's not just a diet, it's a lifestyle:  . The Mediterranean diet includes lifestyle factors typical of those in the region  . Foods, drinks and meals are best eaten with others and savored . Daily physical activity is important for overall good health . This could be strenuous  exercise like running and aerobics . This could also be more leisurely activities such as walking, housework, yard-work, or taking the stairs . Moderation is the key; a balanced and healthy diet accommodates most foods and drinks . Consider portion sizes and frequency of consumption of certain foods   Meal Ideas & Options:  . Breakfast:  o Whole wheat toast or whole wheat English muffins with peanut butter & hard boiled egg o Steel cut oats topped with apples & cinnamon and skim milk  o Fresh fruit: banana, strawberries, melon, berries, peaches  o Smoothies: strawberries, bananas, greek yogurt, peanut butter o Low fat greek yogurt with blueberries and granola  o Egg white omelet with spinach and mushrooms o Breakfast couscous: whole wheat couscous, apricots, skim milk, cranberries  . Sandwiches:  o Hummus and grilled vegetables (peppers, zucchini, squash) on whole wheat bread   o Grilled chicken on whole wheat pita with lettuce, tomatoes, cucumbers or tzatziki  o Tuna salad on whole wheat bread: tuna salad made with greek yogurt, olives, red peppers, capers, green onions o Garlic rosemary lamb pita: lamb sauted with garlic, rosemary, salt & pepper; add lettuce, cucumber, greek yogurt to pita - flavor with lemon juice and black pepper  . Seafood:  o Mediterranean grilled salmon, seasoned with garlic, basil, parsley, lemon juice and black pepper o Shrimp, lemon, and spinach whole-grain pasta salad made with low fat greek yogurt  o Seared scallops with lemon orzo  o Seared tuna steaks seasoned salt, pepper, coriander topped with tomato mixture of olives, tomatoes, olive oil, minced garlic, parsley, green onions and cappers  . Meats:  o Herbed greek chicken salad with kalamata olives, cucumber, feta  o Red bell peppers stuffed with spinach, bulgur, lean ground beef (or lentils) & topped with feta   o Kebabs: skewers of chicken, tomatoes, onions, zucchini, squash  o [place] burgers: made with  red onions, mint, dill, lemon juice, feta cheese topped with roasted red peppers . Vegetarian o Cucumber salad: cucumbers, artichoke hearts, celery, red onion, feta cheese, tossed in olive oil & lemon juice  o Hummus and whole grain pita points with a greek salad (lettuce, tomato, feta, olives, cucumbers, red onion) 6.  Follow up in 3 months. o Lentil soup with celery, carrots made with vegetable broth, garlic, salt and pepper  o Tabouli salad: parsley, bulgur, mint, scallions, cucumbers, tomato, radishes, lemon juice, olive oil, salt and pepper.

## 2015-06-11 NOTE — Progress Notes (Signed)
NEUROLOGY CONSULTATION NOTE  Sean Mullins MRN: [id number] DOB: [birth year]  Referring provider: Dr. Quay Burow Primary care provider: Dr. Quay Burow  Reason for consult:  TIA  HISTORY OF PRESENT ILLNESS: Sean Mullins is a 65 year old right-handed male with type 2 diabetes, hypertension and hyperlipidemia who presents for transient ischemic attack.  History obtained by patient and hospital note.  Labs, carotid doppler and echo reports and imaging of head CT, MRI and MRA of neck reviewed.  He was admitted to [facility] on 04/25/15 for TIA.  He developed dizziness at around 2 pm.  While driving home, he felt nauseous and developed numbness of the left arm and leg.  He doesn't remember the rest of the drive home but EMS was called to his house and he was brought to the ED.  Symptoms began to improve around 6 pm and were resolved by next morning.  He was found to have left lower extremity weakness on evaluation in the ED.  CT of brain showed no acute abnormality.  He was unable to fit in the MRI scanner, so repeat CT in 24 hours was also unremarkable.  CTA of the head was unremarkable for intracranial stenosis.  Echo showed EF 60-65% with no cardiac source of embolus.  Carotid doppler showed heterogeneous plaque at the bifurcation with peak velocities suggesting 50-69% stenosis but ICA/CCA ratio suggesting lesser degree of stenosis in both ICAs.  Hgb A1c was 6.5%.  LDL was 100 but recheck a couple of days later was 67.  He was discharged on ASA 54m daily and Crestor 1110mdaily.  He did have an MRI of the brain on 05/27/15, which was unremarkable.  MRA of the neck showed no hemodynamically significant ICA stenosis.  Since the incident, he has made lifestyle changes.  He eats more healthy.  He walks 30 minutes a day.  Blood pressure has improved, running usually in the 120-130s/70s.  PAST MEDICAL HISTORY: Past Medical History  Diagnosis Date  . Arthritis   . Diverticula of colon   . Anxiety     PAST  SURGICAL HISTORY: Past Surgical History  Procedure Laterality Date  . Duodenal diverticulectomy      2003  . Knee arthroplasty Left     MEDICATIONS: Current Outpatient Prescriptions on File Prior to Visit  Medication Sig Dispense Refill  . aspirin EC 81 MG tablet Take 1 tablet (81 mg total) by mouth daily. 30 tablet 0  . blood glucose meter kit and supplies KIT Dispense based on patient and insurance preference. Use daily and as needed.  NIDDM 1 each 0  . celecoxib (CELEBREX) 200 MG capsule Take 200 mg by mouth 2 (two) times daily as needed for moderate pain.    . Marland Kitchenisinopril (PRINIVIL,ZESTRIL) 10 MG tablet Take 1 tablet (10 mg total) by mouth daily. 30 tablet 0  . potassium gluconate 595 MG TABS tablet Take 1,190 mg by mouth daily.    . rosuvastatin (CRESTOR) 10 MG tablet Take 1 tablet (10 mg total) by mouth daily. 30 tablet 0   No current facility-administered medications on file prior to visit.    ALLERGIES: No Known Allergies  FAMILY HISTORY: No family history on file.  SOCIAL HISTORY: Social History   Social History  . Marital Status: Married    Spouse Name: N/A  . Number of Children: N/A  . Years of Education: N/A   Occupational History  . Not on file.   Social History Main Topics  . Smoking status: Former Smoker --  1.50 packs/day for 30 years    Quit date: 06/07/1996  . Smokeless tobacco: Not on file  . Alcohol Use: Yes     Comment: ocassional   . Drug Use: Not on file  . Sexual Activity: Not on file   Other Topics Concern  . Not on file   Social History Narrative   No regular exercise.     REVIEW OF SYSTEMS: Constitutional: No fevers, chills, or sweats, no generalized fatigue, change in appetite Eyes: No visual changes, double vision, eye pain Ear, nose and throat: No hearing loss, ear pain, nasal congestion, sore throat Cardiovascular: No chest pain, palpitations Respiratory:  No shortness of breath at rest or with exertion,  wheezes GastrointestinaI: No nausea, vomiting, diarrhea, abdominal pain, fecal incontinence Genitourinary:  No dysuria, urinary retention or frequency Musculoskeletal:  No neck pain, back pain Integumentary: No rash, pruritus, skin lesions Neurological: as above Psychiatric: No depression, insomnia, anxiety Endocrine: No palpitations, fatigue, diaphoresis, mood swings, change in appetite, change in weight, increased thirst Hematologic/Lymphatic:  No anemia, purpura, petechiae. Allergic/Immunologic: no itchy/runny eyes, nasal congestion, recent allergic reactions, rashes  PHYSICAL EXAM: Filed Vitals:   06/11/15 1416  BP: 140/68  Pulse: 72   General: No acute distress.  Patient appears well-groomed.  Head:  Normocephalic/atraumatic Eyes:  fundi unremarkable, without vessel changes, exudates, hemorrhages or papilledema. Neck: supple, no paraspinal tenderness, full range of motion Back: No paraspinal tenderness Heart: regular rate and rhythm Lungs: Clear to auscultation bilaterally. Vascular: No carotid bruits. Neurological Exam: Mental status: alert and oriented to person, place, and time, recent and remote memory intact, fund of knowledge intact, attention and concentration intact, speech fluent and not dysarthric, language intact. Cranial nerves: CN I: not tested CN II: pupils equal, round and reactive to light, visual fields intact, fundi unremarkable, without vessel changes, exudates, hemorrhages or papilledema. CN III, IV, VI:  full range of motion, no nystagmus, no ptosis CN V: facial sensation intact CN VII: upper and lower face symmetric CN VIII: hearing intact CN IX, X: gag intact, uvula midline CN XI: sternocleidomastoid and trapezius muscles intact CN XII: tongue midline Bulk & Tone: normal, no fasciculations. Motor:  5/5 throughout  Sensation:  Pinprick and vibration sensation intact. Deep Tendon Reflexes:  2+ throughout, toes downgoing.  Finger to nose testing:   Without dysmetria.  Heel to shin:  Without dysmetria.  Gait:  Normal station and stride.  Able to turn and tandem walk. Romberg negative.  IMPRESSION: TIA HTN  PLAN: 1.  Continue ASA 77m daily 2.  Continue statin therapy.  LDL goal should be less than 70.  Even if it is less than 70, he should always remain on a statin 3.  Continue lifestyle modifications (diet, exercise, BP control).  BP borderline high today, but should continue to be monitored and rechecked with PCP. 4.  Follow up in 3 months.  Thank you for allowing me to take part in the care of this patient.  AMetta Clines DO  CC:  SBilley Gosling MD

## 2015-10-01 ENCOUNTER — Ambulatory Visit (INDEPENDENT_AMBULATORY_CARE_PROVIDER_SITE_OTHER): Payer: BLUE CROSS/BLUE SHIELD | Admitting: Neurology

## 2015-10-01 ENCOUNTER — Encounter: Payer: Self-pay | Admitting: Neurology

## 2015-10-01 VITALS — BP 134/74 | HR 71 | Ht 72.0 in | Wt 255.2 lb

## 2015-10-01 DIAGNOSIS — G459 Transient cerebral ischemic attack, unspecified: Secondary | ICD-10-CM

## 2015-10-01 NOTE — Patient Instructions (Addendum)
1.  Continue aspirin 81mg  daily 2.  Continue statin therapy for cholesterol.   3.  Continue diet and exercise 4.  From my standpoint, you may follow up as needed.  You are doing well!

## 2015-10-01 NOTE — Progress Notes (Signed)
NEUROLOGY FOLLOW UP OFFICE NOTE  Sean Mullins [id number]  HISTORY OF PRESENT ILLNESS: Sean Mullins is a 65 year old right-handed male with type 2 diabetes, hypertension and hyperlipidemia who follows up for TIA.  UPDATE: He is still on ASA 6m daily and Crestor 143mdaily.  He is doing well.  He changed his diet and has lost weight.  He walks 30 minutes every other day.  He feels great.  He has only had a couple of beers since last visit.  HISTORY: [facility] for TIA.  He developed dizziness at around 2 pm. While driving home, he felt nauseous and developed numbness of the left arm and leg.  He doesn't remember the rest of the drive home but EMS was called to his house and he was brought to the ED.  Symptoms began to improve around 6 pm and were resolved by next morning.  He was found to have left lower extremity weakness on evaluation in the ED.  CT of brain showed no acute abnormality.  He was unable to fit in the MRI scanner, so repeat CT in 24 hours was also unremarkable.  CTA of the head was unremarkable for intracranial stenosis.  Echo showed EF 60-65% with no cardiac source of embolus.  Carotid doppler showed heterogeneous plaque at the bifurcation with peak velocities suggesting 50-69% stenosis but ICA/CCA ratio suggesting lesser degree of stenosis in both ICAs.  Hgb A1c was 6.5%.  LDL was 100 but recheck a couple of days later was 67.  He was discharged on ASA 8172maily and Crestor 71m70mily.  He did have an MRI of the brain on 05/27/15, which was unremarkable.  MRA of the neck showed no hemodynamically significant ICA stenosis.  Since the incident, he has made lifestyle changes.  He eats more healthy.  He walks 30 minutes a day.  Blood pressure has improved, running usually in the 120-130s/70s.  PAST MEDICAL HISTORY: Past Medical History  Diagnosis Date  . Arthritis   . Diverticula of colon   . Anxiety     MEDICATIONS: Current Outpatient  Prescriptions on File Prior to Visit  Medication Sig Dispense Refill  . aspirin EC 81 MG tablet Take 1 tablet (81 mg total) by mouth daily. 30 tablet 0  . blood glucose meter kit and supplies KIT Dispense based on patient and insurance preference. Use daily and as needed.  NIDDM 1 each 0  . celecoxib (CELEBREX) 200 MG capsule Take 200 mg by mouth 2 (two) times daily as needed for moderate pain.    . liMarland Kitcheninopril (PRINIVIL,ZESTRIL) 10 MG tablet Take 1 tablet (10 mg total) by mouth daily. 30 tablet 0  . potassium gluconate 595 MG TABS tablet Take 1,190 mg by mouth daily.    . rosuvastatin (CRESTOR) 10 MG tablet Take 1 tablet (10 mg total) by mouth daily. 30 tablet 0   No current facility-administered medications on file prior to visit.    ALLERGIES: No Known Allergies  FAMILY HISTORY: No family history on file.  SOCIAL HISTORY: Social History   Social History  . Marital Status: Married    Spouse Name: N/A  . Number of Children: N/A  . Years of Education: N/A   Occupational History  . Not on file.   Social History Main Topics  . Smoking status: Former Smoker -- 1.50 packs/day for 30 years    Quit date: 06/07/1996  . Smokeless tobacco: Not on file  . Alcohol  Use: Yes     Comment: ocassional   . Drug Use: Not on file  . Sexual Activity: Not on file   Other Topics Concern  . Not on file   Social History Narrative   No regular exercise.     REVIEW OF SYSTEMS: Constitutional: No fevers, chills, or sweats, no generalized fatigue, change in appetite Eyes: No visual changes, double vision, eye pain Ear, nose and throat: No hearing loss, ear pain, nasal congestion, sore throat Cardiovascular: No chest pain, palpitations Respiratory:  No shortness of breath at rest or with exertion, wheezes GastrointestinaI: No nausea, vomiting, diarrhea, abdominal pain, fecal incontinence Genitourinary:  No dysuria, urinary retention or frequency Musculoskeletal:  No neck pain, back  pain Integumentary: No rash, pruritus, skin lesions Neurological: as above Psychiatric: No depression, insomnia, anxiety Endocrine: No palpitations, fatigue, diaphoresis, mood swings, change in appetite, change in weight, increased thirst Hematologic/Lymphatic:  No anemia, purpura, petechiae. Allergic/Immunologic: no itchy/runny eyes, nasal congestion, recent allergic reactions, rashes  PHYSICAL EXAM: Filed Vitals:   10/01/15 1312  BP: 134/74  Pulse: 71   General: No acute distress.  Patient appears well-groomed.  Head:  Normocephalic/atraumatic Eyes:  Fundi examined but not visualized Neck: supple, no paraspinal tenderness, full range of motion Heart:  Regular rate and rhythm Lungs:  Clear to auscultation bilaterally Back: No paraspinal tenderness Neurological Exam: alert and oriented to person, place, and time. Attention span and concentration intact, recent and remote memory intact, fund of knowledge intact.  Speech fluent and not dysarthric, language intact.  CN II-XII intact. Bulk and tone normal, muscle strength 5/5 throughout.  Sensation to light touch, temperature and vibration intact.  Deep tendon reflexes 2+ throughout.  Finger to nose and heel to shin testing intact.  Gait normal, Romberg negative.  IMPRESSION: Transient ischemic attack  PLAN: 1.  Continue ASA 82m daily 2.  Continue statin therapy.  LDL goal should be less than 70.   3.  Continue lifestyle modifications (diet, exercise, BP control).  BP borderline high today, but should continue to be monitored and rechecked with PCP. 4.  Continue diet.  Hgb A1c goal should be less than 6.5. 5.  Follow up only as needed.  15 minutes spent face to face with patient, over 50% spent discussing managment.  AMetta Clines DO  CC:  SBilley Gosling MD

## 2015-10-06 ENCOUNTER — Other Ambulatory Visit: Payer: Self-pay | Admitting: Internal Medicine

## 2015-10-08 ENCOUNTER — Ambulatory Visit (INDEPENDENT_AMBULATORY_CARE_PROVIDER_SITE_OTHER): Payer: BLUE CROSS/BLUE SHIELD | Admitting: Internal Medicine

## 2015-10-08 ENCOUNTER — Encounter: Payer: Self-pay | Admitting: Internal Medicine

## 2015-10-08 VITALS — BP 122/80 | HR 57 | Temp 97.7°F | Resp 16 | Wt 250.0 lb

## 2015-10-08 DIAGNOSIS — G459 Transient cerebral ischemic attack, unspecified: Secondary | ICD-10-CM | POA: Diagnosis not present

## 2015-10-08 DIAGNOSIS — E119 Type 2 diabetes mellitus without complications: Secondary | ICD-10-CM | POA: Diagnosis not present

## 2015-10-08 DIAGNOSIS — I2510 Atherosclerotic heart disease of native coronary artery without angina pectoris: Secondary | ICD-10-CM

## 2015-10-08 DIAGNOSIS — I10 Essential (primary) hypertension: Secondary | ICD-10-CM

## 2015-10-08 DIAGNOSIS — E785 Hyperlipidemia, unspecified: Secondary | ICD-10-CM

## 2015-10-08 NOTE — Patient Instructions (Addendum)

## 2015-10-08 NOTE — Progress Notes (Signed)
Pre visit review using our clinic review tool, if applicable. No additional management support is needed unless otherwise documented below in the visit note. 

## 2015-10-08 NOTE — Progress Notes (Signed)
Subjective:    Patient ID: Sean Mullins, male    DOB: Nov 06, 1950, 65 y.o.   MRN: [id number]  HPI He is here for follow up.  He has no concerns.  He has been exercising regularly and eating very healthy.  He has lost weight.    Diabetes: He is taking his medication daily as prescribed. He is compliant with a diabetic diet. He is exercising regularly - walking every other day for 30 minutes.   CAD, Hypertension: He is taking his medication daily. He is compliant with a low sodium diet.  He denies chest pain, palpitations, edema, shortness of breath and regular headaches. He is exercising regularly.  He does not monitor his blood pressure at home.     TIA:  He is taking all his medications daily.  He denies any headaches, lightheadedness, weakness/numbness/tingling, speech difficulty or difficulty speaking.    Hyperlipidemia: He is taking his medication daily. He is compliant with a low fat/cholesterol diet. He is exercising regularly.    Medications and allergies reviewed with patient and updated if appropriate.  Patient Active Problem List   Diagnosis Date Noted  . Essential hypertension 04/29/2015  . TIA (transient ischemic attack) 04/29/2015  . Hyperlipidemia 04/29/2015  . Diabetes ([place]) 04/29/2015  . NOCTURIA 08/12/2009  . OVERWEIGHT 05/17/2008  . OBSTRUCTIVE SLEEP APNEA 05/17/2008  . RESTLESS LEG SYNDROME 05/17/2008  . Coronary atherosclerosis 05/17/2008  . DIVERTICULITIS, COLON 05/17/2008  . DEGENERATIVE JOINT DISEASE 05/17/2008    Current Outpatient Prescriptions on File Prior to Visit  Medication Sig Dispense Refill  . aspirin EC 81 MG tablet Take 1 tablet (81 mg total) by mouth daily. 30 tablet 0  . blood glucose meter kit and supplies KIT Dispense based on patient and insurance preference. Use daily and as needed.  NIDDM 1 each 0  . celecoxib (CELEBREX) 200 MG capsule Take 200 mg by mouth 2 (two) times daily as needed for moderate pain.    Marland Kitchen lisinopril  (PRINIVIL,ZESTRIL) 10 MG tablet TAKE 1 TABLET DAILY 90 tablet 2  . potassium gluconate 595 MG TABS tablet Take 1,190 mg by mouth daily.    . rosuvastatin (CRESTOR) 10 MG tablet TAKE 1 TABLET DAILY 90 tablet 2   No current facility-administered medications on file prior to visit.    Past Medical History  Diagnosis Date  . Arthritis   . Diverticula of colon   . Anxiety     Past Surgical History  Procedure Laterality Date  . Duodenal diverticulectomy      2003  . Knee arthroplasty Left     Social History   Social History  . Marital Status: Married    Spouse Name: N/A  . Number of Children: N/A  . Years of Education: N/A   Social History Main Topics  . Smoking status: Former Smoker -- 1.50 packs/day for 30 years    Quit date: 06/07/1996  . Smokeless tobacco: None  . Alcohol Use: Yes     Comment: ocassional   . Drug Use: None  . Sexual Activity: Not Asked   Other Topics Concern  . None   Social History Narrative   No regular exercise.     No family history on file.  Review of Systems  Constitutional: Negative for fever and chills.  Respiratory: Negative for cough, shortness of breath and wheezing.   Cardiovascular: Negative for chest pain, palpitations and leg swelling.  Neurological: Positive for dizziness (transient with quick movements). Negative for speech difficulty, weakness,  light-headedness, numbness and headaches.  Psychiatric/Behavioral: Negative for confusion.       Objective:   Filed Vitals:   10/08/15 1331  BP: 122/80  Pulse: 57  Temp: 97.7 F (36.5 C)  Resp: 16   Filed Weights   10/08/15 1331  Weight: 250 lb (113.399 kg)   Body mass index is 33.9 kg/(m^2).   Physical Exam Constitutional: Appears well-developed and well-nourished. No distress.  Neck: Neck supple. No tracheal deviation present. No thyromegaly present.  No carotid bruit. No cervical adenopathy.   Cardiovascular: Normal rate, regular rhythm and normal heart sounds.     No murmur heard.  No edema Pulmonary/Chest: Effort normal and breath sounds normal. No respiratory distress. No wheezes.       Assessment & Plan:   See Problem List for Assessment and Plan of chronic medical problems.  Follow up in 6 months

## 2016-01-09 ENCOUNTER — Emergency Department (HOSPITAL_COMMUNITY): Payer: BLUE CROSS/BLUE SHIELD

## 2016-01-09 ENCOUNTER — Encounter (HOSPITAL_COMMUNITY): Payer: Self-pay | Admitting: Emergency Medicine

## 2016-01-09 ENCOUNTER — Emergency Department (HOSPITAL_COMMUNITY)
Admission: EM | Admit: 2016-01-09 | Discharge: 2016-01-09 | Disposition: A | Discharge status: Home / Self Care | Payer: BLUE CROSS/BLUE SHIELD | Attending: Emergency Medicine | Admitting: Emergency Medicine

## 2016-01-09 DIAGNOSIS — Z8673 Personal history of transient ischemic attack (TIA), and cerebral infarction without residual deficits: Secondary | ICD-10-CM | POA: Insufficient documentation

## 2016-01-09 DIAGNOSIS — Z87891 Personal history of nicotine dependence: Secondary | ICD-10-CM | POA: Diagnosis not present

## 2016-01-09 DIAGNOSIS — Z7982 Long term (current) use of aspirin: Secondary | ICD-10-CM | POA: Insufficient documentation

## 2016-01-09 DIAGNOSIS — I10 Essential (primary) hypertension: Secondary | ICD-10-CM | POA: Insufficient documentation

## 2016-01-09 DIAGNOSIS — Z79899 Other long term (current) drug therapy: Secondary | ICD-10-CM | POA: Insufficient documentation

## 2016-01-09 DIAGNOSIS — E119 Type 2 diabetes mellitus without complications: Secondary | ICD-10-CM | POA: Diagnosis not present

## 2016-01-09 DIAGNOSIS — R42 Dizziness and giddiness: Secondary | ICD-10-CM | POA: Diagnosis not present

## 2016-01-09 DIAGNOSIS — H538 Other visual disturbances: Secondary | ICD-10-CM | POA: Diagnosis present

## 2016-01-09 HISTORY — DX: Transient cerebral ischemic attack, unspecified: G45.9

## 2016-01-09 LAB — DIFFERENTIAL
Basophils Absolute: 0 10*3/uL (ref 0.0–0.1)
Basophils Relative: 0 %
EOS ABS: 0.1 10*3/uL (ref 0.0–0.7)
EOS PCT: 2 %
Lymphocytes Relative: 40 %
Lymphs Abs: 2.4 10*3/uL (ref 0.7–4.0)
Monocytes Absolute: 0.7 10*3/uL (ref 0.1–1.0)
Monocytes Relative: 12 %
NEUTROS PCT: 46 %
Neutro Abs: 2.8 10*3/uL (ref 1.7–7.7)

## 2016-01-09 LAB — URINALYSIS, ROUTINE W REFLEX MICROSCOPIC
BILIRUBIN URINE: NEGATIVE
GLUCOSE, UA: NEGATIVE mg/dL
HGB URINE DIPSTICK: NEGATIVE
Ketones, ur: NEGATIVE mg/dL
Leukocytes, UA: NEGATIVE
Nitrite: NEGATIVE
PH: 7.5 (ref 5.0–8.0)
Protein, ur: NEGATIVE mg/dL
SPECIFIC GRAVITY, URINE: 1.014 (ref 1.005–1.030)

## 2016-01-09 LAB — PROTIME-INR
INR: 1
PROTHROMBIN TIME: 13.2 s (ref 11.4–15.2)

## 2016-01-09 LAB — COMPREHENSIVE METABOLIC PANEL
ALT: 24 U/L (ref 17–63)
AST: 22 U/L (ref 15–41)
Albumin: 4.2 g/dL (ref 3.5–5.0)
Alkaline Phosphatase: 65 U/L (ref 38–126)
Anion gap: 8 (ref 5–15)
BUN: 16 mg/dL (ref 6–20)
CO2: 22 mmol/L (ref 22–32)
Calcium: 9.2 mg/dL (ref 8.9–10.3)
Chloride: 105 mmol/L (ref 101–111)
Creatinine, Ser: 0.73 mg/dL (ref 0.61–1.24)
GFR calc Af Amer: >60 mL/min (ref >60)
GFR calc non Af Amer: >60 mL/min (ref >60)
Glucose, Bld: 103 mg/dL — ABNORMAL HIGH (ref 65–99)
Potassium: 3.9 mmol/L (ref 3.5–5.1)
Sodium: 135 mmol/L (ref 135–145)
Total Bilirubin: 1.4 mg/dL — ABNORMAL HIGH (ref 0.3–1.2)
Total Protein: 7.3 g/dL (ref 6.5–8.1)

## 2016-01-09 LAB — I-STAT TROPONIN, ED: TROPONIN I, POC: 0 ng/mL (ref 0.00–0.08)

## 2016-01-09 LAB — RAPID URINE DRUG SCREEN, HOSP PERFORMED
AMPHETAMINES: NOT DETECTED
BENZODIAZEPINES: NOT DETECTED
Barbiturates: NOT DETECTED
Cocaine: NOT DETECTED
Opiates: NOT DETECTED
Tetrahydrocannabinol: NOT DETECTED

## 2016-01-09 LAB — CBC
HCT: 41.8 % (ref 39.0–52.0)
Hemoglobin: 15.1 g/dL (ref 13.0–17.0)
MCH: 32.2 pg (ref 26.0–34.0)
MCHC: 36.1 g/dL — AB (ref 30.0–36.0)
MCV: 89.1 fL (ref 78.0–100.0)
PLATELETS: 183 10*3/uL (ref 150–400)
RBC: 4.69 MIL/uL (ref 4.22–5.81)
RDW: 12.7 % (ref 11.5–15.5)
WBC: 6.1 10*3/uL (ref 4.0–10.5)

## 2016-01-09 LAB — ETHANOL: Alcohol, Ethyl (B): <5 mg/dL (ref <5)

## 2016-01-09 LAB — APTT: aPTT: 29 seconds (ref 24–36)

## 2016-01-09 LAB — CBG MONITORING, ED: Glucose-Capillary: 104 mg/dL — ABNORMAL HIGH (ref 65–99)

## 2016-01-09 MED ORDER — LORAZEPAM 2 MG/ML IJ SOLN
1.0000 mg | Freq: Once | INTRAMUSCULAR | Status: AC
  Administered 2016-01-09: 1 mg via INTRAVENOUS
  Filled 2016-01-09: qty 1

## 2016-01-09 MED ORDER — MECLIZINE HCL 25 MG PO TABS
25.0000 mg | ORAL_TABLET | Freq: Once | ORAL | Status: AC
  Administered 2016-01-09: 25 mg via ORAL
  Filled 2016-01-09: qty 1

## 2016-01-09 MED ORDER — MECLIZINE HCL 25 MG PO TABS: 25.0000 mg | ORAL_TABLET | Freq: Three times a day (TID) | ORAL | 0 refills | Status: DC | PRN

## 2016-01-09 NOTE — ED Notes (Signed)
Fabio Neighbors reports pt states left facial numbness and asymmetry, left arm tingling, and left leg tingling and weakness. This nurse reassessed pt at present time pt states left facial numbness about 5 minutes pt have resolved at present time. Pt current complaint of headache, bilateral hand tingling, and dizziness; same as arrival to ED. Knapp aware and states no change in plan of care.

## 2016-01-09 NOTE — ED Notes (Signed)
Tried sitting patient up on side of bed prior to ambulating patient and patient became very dizzy. He said the room was spinning.  Unable to ambulate.  Returned patient to lying position in bed with side rails up.

## 2016-01-09 NOTE — ED Triage Notes (Addendum)
Pt reports dizziness/lightheadedness and weakness that began this am at 0815. Had blurred vision initially, but that has gotten better since then. Pt reports he had a TIA in November with similar symptoms. No extremity weakness, facial droop or trouble speaking. No pain.

## 2016-01-09 NOTE — ED Notes (Signed)
Sean Mullins unsuccessful IV attempt to L forearm.

## 2016-01-09 NOTE — ED Notes (Signed)
MRI not completed yet

## 2016-01-09 NOTE — ED Notes (Signed)
Pt low HR normal.

## 2016-01-09 NOTE — ED Notes (Signed)
MD at bedside. 

## 2016-01-09 NOTE — ED Provider Notes (Signed)
[facility] DEPT Provider Note   CSN: [id number] Arrival date & time: 01/09/16  4982  First Provider Contact:  None       History   Chief Complaint Chief Complaint  Patient presents with  . Dizziness  . Blurred Vision    HPI Sean Mullins is a 65 y.o. male.  HPI Pt started having trouble with dizziness, and room spinning.  That started at around 8am.  Pt was on a ladder at the time.  He was able to get down off the ladder.  Pt notices that when he moves his head around it gets worse.  Not particularly on one side or the other.  No trouble with speech.  No trouble with focal numbness or weakness.   He has some tingling in his left fingers but not numb.  He thinks his vision may be a little blurry but it is mostly spinning.   Past Medical History:  Diagnosis Date  . Anxiety   . Arthritis   . Diverticula of colon   . TIA (transient ischemic attack)    November 2013    Patient Active Problem List   Diagnosis Date Noted  . Essential hypertension 04/29/2015  . TIA (transient ischemic attack) 04/29/2015  . Hyperlipidemia 04/29/2015  . Diabetes ([place]) 04/29/2015  . NOCTURIA 08/12/2009  . OVERWEIGHT 05/17/2008  . OBSTRUCTIVE SLEEP APNEA 05/17/2008  . RESTLESS LEG SYNDROME 05/17/2008  . Coronary atherosclerosis 05/17/2008  . DIVERTICULITIS, COLON 05/17/2008  . DEGENERATIVE JOINT DISEASE 05/17/2008    Past Surgical History:  Procedure Laterality Date  . DUODENAL DIVERTICULECTOMY     2003  . KNEE ARTHROPLASTY Left        Home Medications    Prior to Admission medications   Medication Sig Start Date End Date Taking? Authorizing Provider  aspirin EC 81 MG tablet Take 1 tablet (81 mg total) by mouth daily. 04/26/15  Yes [name], MD  blood glucose meter kit and supplies KIT Dispense based on patient and insurance preference. Use daily and as needed.  NIDDM 04/29/15  Yes [name], MD  celecoxib (CELEBREX) 200 MG capsule Take 200 mg by mouth 2 (two) times  daily.    Yes Historical Provider, MD  lisinopril (PRINIVIL,ZESTRIL) 10 MG tablet TAKE 1 TABLET DAILY Patient taking differently: TAKE 10 mg by mouth once daily 10/06/15  Yes [name], MD  potassium gluconate 595 MG TABS tablet Take 1,190 mg by mouth daily.   Yes Historical Provider, MD  rosuvastatin (CRESTOR) 10 MG tablet TAKE 1 TABLET DAILY Patient taking differently: TAKE 10 mg by mouth TABLET DAILY 10/06/15  Yes [name], MD  meclizine (ANTIVERT) 25 MG tablet Take 1 tablet (25 mg total) by mouth 3 (three) times daily as needed for dizziness. 01/09/16   [name], MD    Family History History reviewed. No pertinent family history.  Social History Social History  Substance Use Topics  . Smoking status: Former Smoker    Packs/day: 1.50    Years: 30.00    Quit date: 06/07/1996  . Smokeless tobacco: Not on file  . Alcohol use Yes     Comment: ocassional      Allergies   Review of patient's allergies indicates no known allergies.   Review of Systems Review of Systems  All other systems reviewed and are negative.    Physical Exam Updated Vital Signs BP 147/84 (BP Location: Left Arm)   Pulse 61   Temp 97.8 F (36.6 C)  Resp 14   SpO2 98%   Physical Exam  Constitutional: He is oriented to person, place, and time. He appears well-developed and well-nourished. No distress.  HENT:  Head: Normocephalic and atraumatic.  Right Ear: External ear normal.  Left Ear: External ear normal.  Mouth/Throat: Oropharynx is clear and moist.  Eyes: Conjunctivae are normal. Right eye exhibits no discharge. Left eye exhibits no discharge. No scleral icterus.  Neck: Neck supple. No tracheal deviation present.  Cardiovascular: Normal rate, regular rhythm and intact distal pulses.   Pulmonary/Chest: Effort normal and breath sounds normal. No stridor. No respiratory distress. He has no wheezes. He has no rales.  Abdominal: Soft. Bowel sounds are normal. He exhibits no distension. There is  no tenderness. There is no rebound and no guarding.  Musculoskeletal: He exhibits no edema or tenderness.  Neurological: He is alert and oriented to person, place, and time. He has normal strength. No cranial nerve deficit (No facial droop, extraocular movements intact, tongue midline  ) or sensory deficit. He exhibits normal muscle tone. He displays no seizure activity. Coordination normal.  No pronator drift bilateral upper extrem, able to hold both legs off bed for 5 seconds, sensation intact in all extremities, no visual field cuts, no left or right sided neglect, normal finger-nose exam bilaterally, + horizontal nystagmus noted   Skin: Skin is warm and dry. No rash noted.  Psychiatric: He has a normal mood and affect.  Nursing note and vitals reviewed.    ED Treatments / Results  Labs (all labs ordered are listed, but only abnormal results are displayed) Labs Reviewed  CBC - Abnormal; Notable for the following:       Result Value   MCHC 36.1 (*)    All other components within normal limits  COMPREHENSIVE METABOLIC PANEL - Abnormal; Notable for the following:    Glucose, Bld 103 (*)    Total Bilirubin 1.4 (*)    All other components within normal limits  CBG MONITORING, ED - Abnormal; Notable for the following:    Glucose-Capillary 104 (*)    All other components within normal limits  ETHANOL  PROTIME-INR  APTT  DIFFERENTIAL  URINE RAPID DRUG SCREEN, HOSP PERFORMED  URINALYSIS, ROUTINE W REFLEX MICROSCOPIC (NOT AT [facility])  I-STAT TROPOININ, ED    EKG  EKG Interpretation  Date/Time:  Friday January 09 2016 09:08:05 EDT Ventricular Rate:  64 PR Interval:    QRS Duration: 101 QT Interval:  413 QTC Calculation: 427 R Axis:   52 Text Interpretation:  Sinus rhythm Baseline wander in lead(s) I III aVL No significant change since last tracing Confirmed by Jaiona Simien  MD-J, Lynnix Schoneman ([id number]) on 01/09/2016 9:11:37 AM       Radiology Dg Chest 2 View  Result Date: 01/09/2016 CLINICAL  DATA:  Dizziness EXAM: CHEST  2 VIEW COMPARISON:  08/11/2009 FINDINGS: The heart size and mediastinal contours are within normal limits. Both lungs are clear. The visualized skeletal structures are unremarkable. IMPRESSION: No active cardiopulmonary disease. Electronically Signed   By: Inez Catalina M.D.   On: 01/09/2016 10:00   Ct Head Wo Contrast  Result Date: 01/09/2016 CLINICAL DATA:  Dizziness EXAM: CT HEAD WITHOUT CONTRAST TECHNIQUE: Contiguous axial images were obtained from the base of the skull through the vertex without intravenous contrast. COMPARISON:  05/27/2015 FINDINGS: The bony calvarium is intact. The ventricles are of normal size and configuration. No findings to suggest acute hemorrhage, acute infarction or space-occupying mass lesion are noted. IMPRESSION: No acute intracranial  abnormality noted. Electronically Signed   By: Inez Catalina M.D.   On: 01/09/2016 10:02   Mr Brain Wo Contrast  Result Date: 01/09/2016 CLINICAL DATA:  Left facial numbness and facial drooping. Left arm tingling. Left leg tingling in weakness. EXAM: MRI HEAD WITHOUT CONTRAST TECHNIQUE: Multiplanar, multiecho pulse sequences of the brain and surrounding structures were obtained without intravenous contrast. COMPARISON:  Head CT same day.  MRI 05/27/2015 FINDINGS: Diffusion imaging does not show any acute or subacute infarction. The brainstem and cerebellum are normal. Cerebral hemispheres are normal without evidence of old or acute small or large vessel infarction. No mass lesion, hemorrhage, hydrocephalus or extra-axial collection. No pituitary mass. No inflammatory sinus disease. No skull or skullbase lesion. IMPRESSION: Normal examination. No cause of the presenting symptoms is identified. Electronically Signed   By: Nelson Chimes M.D.   On: 01/09/2016 16:13    Procedures Procedures (including critical care time)  Medications Ordered in ED Medications  meclizine (ANTIVERT) tablet 25 mg (25 mg Oral Given  01/09/16 1125)  LORazepam (ATIVAN) injection 1 mg (1 mg Intravenous Given 01/09/16 1507)     Initial Impression / Assessment and Plan / ED Course  I have reviewed the triage vital signs and the nursing notes.  Pertinent labs & imaging results that were available during my care of the patient were reviewed by me and considered in my medical decision making (see chart for details).  Clinical Course  Comment By Time  Initial exam.  Suspect peripheral > central cause of vertigo.  Reviewed prior eval in december.  MRI and MRA were normal.   [name], MD 08/04 [phone]  Labs and CT reviewed.  No acute abnormalities [name], MD 08/04 1112  Feeling better.  Will see if he can ambulate.  Suspect peripheral cause [name], MD 08/04 1202  Became dizzy when walking. Balance off.  Will MRI [name], MD 08/04 1229    MRI was ultimately negative.  Sx are related to peripheral vertigo.  No stroke or TIA.  Follow up with PCP  Final Clinical Impressions(s) / ED Diagnoses   Final diagnoses:  Vertigo    New Prescriptions New Prescriptions   MECLIZINE (ANTIVERT) 25 MG TABLET    Take 1 tablet (25 mg total) by mouth 3 (three) times daily as needed for dizziness.     [name], MD 01/09/16 [phone]

## 2016-01-09 NOTE — ED Notes (Signed)
MD at bedside. EDP J KNAPP PRESENT

## 2016-01-09 NOTE — ED Notes (Signed)
Pt cannot use restroom at this time, aware urine specimen is needed.  

## 2016-01-09 NOTE — ED Notes (Signed)
Pt transported to MRI 

## 2016-01-25 DIAGNOSIS — H8110 Benign paroxysmal vertigo, unspecified ear: Secondary | ICD-10-CM | POA: Insufficient documentation

## 2016-01-25 NOTE — Patient Instructions (Addendum)
    Medications reviewed and updated.  No changes recommended at this time.     

## 2016-01-25 NOTE — Progress Notes (Signed)
Subjective:    Patient ID: Sean Mullins, male    DOB: 1951/03/03, 65 y.o.   MRN: [id number]  HPI He is here for follow up from the ED.  He went on 8/4 for dizziness.  He was on a ladder that morning and started to experience dizziness.  Movement of his head worsened the dizziness.  He denies any difficulty with speech, numbness, weakness.  His neuro exam in the ED was without deficit.  An MRI of his head was negative.  He was diagnosed with peripheral vertigo.  He was prescribed meclizine 25 mg.  He took the meclizine once a day for two weeks and stopped it a few days ago.  He denies vertigo since the ED.  He feels good and has no complaints.     CXR (8/4):  IMPRESSION: No active cardiopulmonary disease.  Ct Head (8/4):  IMPRESSION: No acute intracranial abnormality noted.  MRI head (8/4):  IMPRESSION: Normal examination. No cause of the presenting symptoms is identified.   Medications and allergies reviewed with patient and updated if appropriate.  Patient Active Problem List   Diagnosis Date Noted  . BPPV (benign paroxysmal positional vertigo) 01/25/2016  . Essential hypertension 04/29/2015  . TIA (transient ischemic attack) 04/29/2015  . Hyperlipidemia 04/29/2015  . Diabetes ([place]) 04/29/2015  . NOCTURIA 08/12/2009  . OVERWEIGHT 05/17/2008  . OBSTRUCTIVE SLEEP APNEA 05/17/2008  . RESTLESS LEG SYNDROME 05/17/2008  . Coronary atherosclerosis 05/17/2008  . DIVERTICULITIS, COLON 05/17/2008  . DEGENERATIVE JOINT DISEASE 05/17/2008    Current Outpatient Prescriptions on File Prior to Visit  Medication Sig Dispense Refill  . aspirin EC 81 MG tablet Take 1 tablet (81 mg total) by mouth daily. 30 tablet 0  . blood glucose meter kit and supplies KIT Dispense based on patient and insurance preference. Use daily and as needed.  NIDDM 1 each 0  . celecoxib (CELEBREX) 200 MG capsule Take 200 mg by mouth 2 (two) times daily.     Marland Kitchen lisinopril (PRINIVIL,ZESTRIL) 10 MG tablet TAKE 1  TABLET DAILY (Patient taking differently: TAKE 10 mg by mouth once daily) 90 tablet 2  . meclizine (ANTIVERT) 25 MG tablet Take 1 tablet (25 mg total) by mouth 3 (three) times daily as needed for dizziness. 30 tablet 0  . potassium gluconate 595 MG TABS tablet Take 1,190 mg by mouth daily.    . rosuvastatin (CRESTOR) 10 MG tablet TAKE 1 TABLET DAILY (Patient taking differently: TAKE 10 mg by mouth TABLET DAILY) 90 tablet 2   No current facility-administered medications on file prior to visit.     Past Medical History:  Diagnosis Date  . Anxiety   . Arthritis   . Diverticula of colon   . TIA (transient ischemic attack)    November 2013    Past Surgical History:  Procedure Laterality Date  . DUODENAL DIVERTICULECTOMY     2003  . KNEE ARTHROPLASTY Left     Social History   Social History  . Marital status: Married    Spouse name: N/A  . Number of children: N/A  . Years of education: N/A   Social History Main Topics  . Smoking status: Former Smoker    Packs/day: 1.50    Years: 30.00    Quit date: 06/07/1996  . Smokeless tobacco: Not on file  . Alcohol use Yes     Comment: ocassional   . Drug use: Unknown  . Sexual activity: Not on file   Other Topics Concern  .  Not on file   Social History Narrative   No regular exercise.     No family history on file.  Review of Systems  Constitutional: Negative for fever.  Respiratory: Negative for cough, shortness of breath and wheezing.   Cardiovascular: Negative for chest pain, palpitations and leg swelling.  Neurological: Negative for dizziness, weakness, light-headedness, numbness and headaches.       Objective:   Vitals:   01/26/16 0801  BP: 134/78  Pulse: (!) 59  Resp: 16  Temp: 98 F (36.7 C)   Filed Weights   01/26/16 0801  Weight: 251 lb (113.9 kg)   Body mass index is 34.04 kg/m.   Physical Exam Constitutional: Appears well-developed and well-nourished. No distress.  HENT:  Head: Normocephalic and  atraumatic.  Neck: Neck supple. No tracheal deviation present. No thyromegaly present.  Cardiovascular: Normal rate, regular rhythm and normal heart sounds.   No murmur heard. No carotid bruit  Pulmonary/Chest: Effort normal and breath sounds normal. No respiratory distress. No has no wheezes. No rales.  Musculoskeletal: No edema.  Neurologic: CN II-XII intact, gait normal, normal sensation and strength in all extremities Lymphadenopathy: No cervical adenopathy.  Skin: Skin is warm and dry. Not diaphoretic.  Psychiatric: Normal mood and affect. Behavior is normal.       Assessment & Plan:   See Problem List for Assessment and Plan of chronic medical problems.

## 2016-01-25 NOTE — Assessment & Plan Note (Addendum)
Went to ED 8/4, work up including Ct head and MRI head negative/normal Prescribed meclizine - took daily for two weeks Vertigo resolved No further evaluation or treatment needed

## 2016-01-26 ENCOUNTER — Ambulatory Visit (INDEPENDENT_AMBULATORY_CARE_PROVIDER_SITE_OTHER): Payer: BLUE CROSS/BLUE SHIELD | Admitting: Internal Medicine

## 2016-01-26 ENCOUNTER — Encounter: Payer: Self-pay | Admitting: Internal Medicine

## 2016-01-26 VITALS — BP 134/78 | HR 59 | Temp 98.0°F | Resp 16 | Ht 72.0 in | Wt 251.0 lb

## 2016-01-26 DIAGNOSIS — H8110 Benign paroxysmal vertigo, unspecified ear: Secondary | ICD-10-CM

## 2016-01-26 DIAGNOSIS — I10 Essential (primary) hypertension: Secondary | ICD-10-CM | POA: Diagnosis not present

## 2016-01-26 NOTE — Progress Notes (Signed)
Pre visit review using our clinic review tool, if applicable. No additional management support is needed unless otherwise documented below in the visit note. 

## 2016-01-26 NOTE — Assessment & Plan Note (Signed)
BP well controlled Current regimen effective and well tolerated Continue current medications at current doses  

## 2016-04-08 ENCOUNTER — Ambulatory Visit (INDEPENDENT_AMBULATORY_CARE_PROVIDER_SITE_OTHER): Payer: BLUE CROSS/BLUE SHIELD | Admitting: Internal Medicine

## 2016-04-08 ENCOUNTER — Encounter: Payer: Self-pay | Admitting: Internal Medicine

## 2016-04-08 ENCOUNTER — Other Ambulatory Visit (INDEPENDENT_AMBULATORY_CARE_PROVIDER_SITE_OTHER): Payer: BLUE CROSS/BLUE SHIELD

## 2016-04-08 VITALS — BP 120/78 | HR 58 | Temp 97.8°F | Ht 75.0 in | Wt 252.0 lb

## 2016-04-08 DIAGNOSIS — E119 Type 2 diabetes mellitus without complications: Secondary | ICD-10-CM

## 2016-04-08 DIAGNOSIS — E7800 Pure hypercholesterolemia, unspecified: Secondary | ICD-10-CM

## 2016-04-08 DIAGNOSIS — I10 Essential (primary) hypertension: Secondary | ICD-10-CM | POA: Diagnosis not present

## 2016-04-08 LAB — MICROALBUMIN / CREATININE URINE RATIO
CREATININE, U: 176.2 mg/dL
Microalb Creat Ratio: 0.5 mg/g (ref 0.0–30.0)
Microalb, Ur: 0.8 mg/dL (ref 0.0–1.9)

## 2016-04-08 LAB — COMPREHENSIVE METABOLIC PANEL
ALT: 27 U/L (ref 0–53)
AST: 18 U/L (ref 0–37)
Albumin: 4.5 g/dL (ref 3.5–5.2)
Alkaline Phosphatase: 62 U/L (ref 39–117)
BILIRUBIN TOTAL: 1.5 mg/dL — AB (ref 0.2–1.2)
BUN: 16 mg/dL (ref 6–23)
CHLORIDE: 104 meq/L (ref 96–112)
CO2: 28 meq/L (ref 19–32)
CREATININE: 0.88 mg/dL (ref 0.40–1.50)
Calcium: 9.8 mg/dL (ref 8.4–10.5)
GFR: 92.92 mL/min (ref >60.00)
GLUCOSE: 106 mg/dL — AB (ref 70–99)
Potassium: 5.1 mEq/L (ref 3.5–5.1)
Sodium: 139 mEq/L (ref 135–145)
Total Protein: 7.5 g/dL (ref 6.0–8.3)

## 2016-04-08 LAB — HEMOGLOBIN A1C: Hgb A1c MFr Bld: 5.3 % (ref 4.6–6.5)

## 2016-04-08 LAB — LIPID PANEL
CHOL/HDL RATIO: 2
CHOLESTEROL: 111 mg/dL (ref 0–200)
HDL: 44.7 mg/dL (ref >39.00)
LDL CALC: 51 mg/dL (ref 0–99)
NonHDL: 66.19
TRIGLYCERIDES: 77 mg/dL (ref 0.0–149.0)
VLDL: 15.4 mg/dL (ref 0.0–40.0)

## 2016-04-08 NOTE — Assessment & Plan Note (Signed)
BP well controlled Current regimen effective and well tolerated Continue current medications at current doses cmp  

## 2016-04-08 NOTE — Progress Notes (Signed)
Pre visit review using our clinic review tool, if applicable. No additional management support is needed unless otherwise documented below in the visit note. 

## 2016-04-08 NOTE — Patient Instructions (Addendum)

## 2016-04-08 NOTE — Assessment & Plan Note (Signed)
Check lipids, cmp Continue statin 

## 2016-04-08 NOTE — Assessment & Plan Note (Signed)
Check a1c, urine micro Continue diabetic diet and regular exercise Follow up in 6 months

## 2016-04-08 NOTE — Progress Notes (Signed)
Subjective:    Patient ID: Sean Mullins, male    DOB: 09-06-1950, 65 y.o.   MRN: [id number]  HPI The patient is here for follow up.  Hypertension: He is taking his medication daily. He is compliant with a low sodium diet.  He denies chest pain, palpitations, edema, shortness of breath and regular headaches. He is exercising, but not as much as he would.   Hyperlipidemia: He is taking his medication daily. He is compliant with a low fat/cholesterol diet. He is exercising regularly. He denies myalgias.   Diabetes: He is controlling his sugars with lifestyle. He is compliant with a diabetic diet. He is exercising regularly. He is up-to-date with an ophthalmology examination.    Medications and allergies reviewed with patient and updated if appropriate.  Patient Active Problem List   Diagnosis Date Noted  . BPPV (benign paroxysmal positional vertigo) 01/25/2016  . Essential hypertension 04/29/2015  . TIA (transient ischemic attack) 04/29/2015  . Hyperlipidemia 04/29/2015  . Diabetes ([place]) 04/29/2015  . NOCTURIA 08/12/2009  . OVERWEIGHT 05/17/2008  . OBSTRUCTIVE SLEEP APNEA 05/17/2008  . RESTLESS LEG SYNDROME 05/17/2008  . Coronary atherosclerosis 05/17/2008  . DIVERTICULITIS, COLON 05/17/2008  . DEGENERATIVE JOINT DISEASE 05/17/2008    Current Outpatient Prescriptions on File Prior to Visit  Medication Sig Dispense Refill  . aspirin EC 81 MG tablet Take 1 tablet (81 mg total) by mouth daily. 30 tablet 0  . blood glucose meter kit and supplies KIT Dispense based on patient and insurance preference. Use daily and as needed.  NIDDM 1 each 0  . celecoxib (CELEBREX) 200 MG capsule Take 200 mg by mouth 2 (two) times daily.     Marland Kitchen lisinopril (PRINIVIL,ZESTRIL) 10 MG tablet TAKE 1 TABLET DAILY (Patient taking differently: TAKE 10 mg by mouth once daily) 90 tablet 2  . meclizine (ANTIVERT) 25 MG tablet Take 1 tablet (25 mg total) by mouth 3 (three) times daily as needed for  dizziness. 30 tablet 0  . potassium gluconate 595 MG TABS tablet Take 1,190 mg by mouth daily.    . rosuvastatin (CRESTOR) 10 MG tablet TAKE 1 TABLET DAILY (Patient taking differently: TAKE 10 mg by mouth TABLET DAILY) 90 tablet 2   No current facility-administered medications on file prior to visit.     Past Medical History:  Diagnosis Date  . Anxiety   . Arthritis   . Diverticula of colon   . TIA (transient ischemic attack)    November 2013    Past Surgical History:  Procedure Laterality Date  . DUODENAL DIVERTICULECTOMY     2003  . KNEE ARTHROPLASTY Left     Social History   Social History  . Marital status: Married    Spouse name: N/A  . Number of children: N/A  . Years of education: N/A   Social History Main Topics  . Smoking status: Former Smoker    Packs/day: 1.50    Years: 30.00    Quit date: 06/07/1996  . Smokeless tobacco: None  . Alcohol use Yes     Comment: ocassional   . Drug use: Unknown  . Sexual activity: Not Asked   Other Topics Concern  . None   Social History Narrative   No regular exercise.     No family history on file.  Review of Systems  Constitutional: Negative for fever.  Respiratory: Negative for cough, shortness of breath, wheezing and stridor.   Cardiovascular: Negative for chest pain, palpitations and leg swelling.  Neurological: Negative for light-headedness, numbness and headaches.       Objective:   Vitals:   04/08/16 0807  BP: 120/78  Pulse: (!) 58  Temp: 97.8 F (36.6 C)   Filed Weights   04/08/16 0807  Weight: 252 lb (114.3 kg)   Body mass index is 31.5 kg/m.   Physical Exam    Constitutional: Appears well-developed and well-nourished. No distress.  HENT:  Head: Normocephalic and atraumatic.  Neck: Neck supple. No tracheal deviation present. No thyromegaly present.  No cervical lymphadenopathy Cardiovascular: Normal rate, regular rhythm and normal heart sounds.   No murmur heard. No carotid bruit .  No  edema Pulmonary/Chest: Effort normal and breath sounds normal. No respiratory distress. No has no wheezes. No rales.  Skin: Skin is warm and dry. Not diaphoretic.  Psychiatric: Normal mood and affect. Behavior is normal.      Assessment & Plan:    See Problem List for Assessment and Plan of chronic medical problems.

## 2016-06-21 ENCOUNTER — Other Ambulatory Visit: Payer: Self-pay | Admitting: Emergency Medicine

## 2016-06-21 MED ORDER — ROSUVASTATIN CALCIUM 10 MG PO TABS: 10.0000 mg | ORAL_TABLET | Freq: Every day | ORAL | 1 refills | Status: DC

## 2016-06-21 MED ORDER — LISINOPRIL 10 MG PO TABS: 10.0000 mg | ORAL_TABLET | Freq: Every day | ORAL | 1 refills | Status: DC

## 2016-06-25 ENCOUNTER — Telehealth (INDEPENDENT_AMBULATORY_CARE_PROVIDER_SITE_OTHER): Payer: Self-pay | Admitting: *Deleted

## 2016-06-25 MED ORDER — CELECOXIB 200 MG PO CAPS: 200.0000 mg | ORAL_CAPSULE | Freq: Two times a day (BID) | ORAL | 3 refills | Status: DC

## 2016-06-25 NOTE — Telephone Encounter (Signed)
I sent it  

## 2016-06-25 NOTE — Telephone Encounter (Signed)
What directions? 

## 2016-06-25 NOTE — Telephone Encounter (Signed)
yes

## 2016-06-25 NOTE — Telephone Encounter (Signed)
[facility] needs directions on Celebrex.

## 2016-06-25 NOTE — Telephone Encounter (Signed)
See message.

## 2016-08-30 ENCOUNTER — Telehealth: Payer: Self-pay | Admitting: Internal Medicine

## 2016-08-30 MED ORDER — LISINOPRIL 10 MG PO TABS: 10.0000 mg | ORAL_TABLET | Freq: Every day | ORAL | 1 refills | Status: DC

## 2016-08-30 MED ORDER — ROSUVASTATIN CALCIUM 10 MG PO TABS
10.0000 mg | ORAL_TABLET | Freq: Every day | ORAL | 1 refills | Status: DC
Start: 2016-08-30 — End: 2017-02-22

## 2016-08-30 NOTE — Telephone Encounter (Signed)
RXs have been sent. Pt is aware.

## 2016-08-30 NOTE — Telephone Encounter (Signed)
Patient requesting lisinopril and rosuvastatin to be sent to [facility].

## 2016-09-06 ENCOUNTER — Emergency Department (HOSPITAL_COMMUNITY)
Admission: EM | Admit: 2016-09-06 | Discharge: 2016-09-06 | Disposition: A | Discharge status: Home / Self Care | Payer: Worker's Compensation | Attending: Emergency Medicine | Admitting: Emergency Medicine

## 2016-09-06 ENCOUNTER — Encounter (HOSPITAL_COMMUNITY): Payer: Self-pay | Admitting: Emergency Medicine

## 2016-09-06 ENCOUNTER — Emergency Department (HOSPITAL_COMMUNITY): Payer: Worker's Compensation

## 2016-09-06 DIAGNOSIS — M25512 Pain in left shoulder: Secondary | ICD-10-CM | POA: Insufficient documentation

## 2016-09-06 DIAGNOSIS — Z8673 Personal history of transient ischemic attack (TIA), and cerebral infarction without residual deficits: Secondary | ICD-10-CM | POA: Insufficient documentation

## 2016-09-06 DIAGNOSIS — Z96652 Presence of left artificial knee joint: Secondary | ICD-10-CM | POA: Insufficient documentation

## 2016-09-06 DIAGNOSIS — Z87891 Personal history of nicotine dependence: Secondary | ICD-10-CM | POA: Insufficient documentation

## 2016-09-06 DIAGNOSIS — R202 Paresthesia of skin: Secondary | ICD-10-CM

## 2016-09-06 DIAGNOSIS — E119 Type 2 diabetes mellitus without complications: Secondary | ICD-10-CM | POA: Insufficient documentation

## 2016-09-06 DIAGNOSIS — M79603 Pain in arm, unspecified: Secondary | ICD-10-CM

## 2016-09-06 DIAGNOSIS — I10 Essential (primary) hypertension: Secondary | ICD-10-CM | POA: Insufficient documentation

## 2016-09-06 DIAGNOSIS — I739 Peripheral vascular disease, unspecified: Secondary | ICD-10-CM | POA: Insufficient documentation

## 2016-09-06 DIAGNOSIS — M79602 Pain in left arm: Secondary | ICD-10-CM

## 2016-09-06 DIAGNOSIS — Z7982 Long term (current) use of aspirin: Secondary | ICD-10-CM | POA: Insufficient documentation

## 2016-09-06 LAB — BASIC METABOLIC PANEL
ANION GAP: 6 (ref 5–15)
BUN: 15 mg/dL (ref 6–20)
CHLORIDE: 107 mmol/L (ref 101–111)
CO2: 25 mmol/L (ref 22–32)
CREATININE: 0.8 mg/dL (ref 0.61–1.24)
Calcium: 9.2 mg/dL (ref 8.9–10.3)
GFR calc Af Amer: >60 mL/min (ref >60)
GFR calc non Af Amer: >60 mL/min (ref >60)
Glucose, Bld: 85 mg/dL (ref 65–99)
POTASSIUM: 3.9 mmol/L (ref 3.5–5.1)
SODIUM: 138 mmol/L (ref 135–145)

## 2016-09-06 LAB — I-STAT CHEM 8, ED
BUN: 15 mg/dL (ref 6–20)
CALCIUM ION: 1.18 mmol/L (ref 1.15–1.40)
CHLORIDE: 105 mmol/L (ref 101–111)
CREATININE: 0.8 mg/dL (ref 0.61–1.24)
Glucose, Bld: 82 mg/dL (ref 65–99)
HEMATOCRIT: 43 % (ref 39.0–52.0)
Hemoglobin: 14.6 g/dL (ref 13.0–17.0)
Potassium: 3.9 mmol/L (ref 3.5–5.1)
SODIUM: 141 mmol/L (ref 135–145)
TCO2: 24 mmol/L (ref 0–100)

## 2016-09-06 LAB — CBC WITH DIFFERENTIAL/PLATELET
Basophils Absolute: 0 10*3/uL (ref 0.0–0.1)
Basophils Relative: 0 %
EOS ABS: 0.1 10*3/uL (ref 0.0–0.7)
Eosinophils Relative: 2 %
HEMATOCRIT: 40.3 % (ref 39.0–52.0)
HEMOGLOBIN: 14.7 g/dL (ref 13.0–17.0)
LYMPHS ABS: 2.9 10*3/uL (ref 0.7–4.0)
Lymphocytes Relative: 41 %
MCH: 31.9 pg (ref 26.0–34.0)
MCHC: 36.5 g/dL — AB (ref 30.0–36.0)
MCV: 87.4 fL (ref 78.0–100.0)
MONOS PCT: 14 %
Monocytes Absolute: 1 10*3/uL (ref 0.1–1.0)
NEUTROS ABS: 3.1 10*3/uL (ref 1.7–7.7)
NEUTROS PCT: 43 %
Platelets: 205 10*3/uL (ref 150–400)
RBC: 4.61 MIL/uL (ref 4.22–5.81)
RDW: 13.1 % (ref 11.5–15.5)
WBC: 7.2 10*3/uL (ref 4.0–10.5)

## 2016-09-06 MED ORDER — NAPROXEN 250 MG PO TABS: 250.0000 mg | ORAL_TABLET | Freq: Two times a day (BID) | ORAL | 0 refills | Status: DC

## 2016-09-06 MED ORDER — IOPAMIDOL (ISOVUE-370) INJECTION 76%
INTRAVENOUS | Status: AC
  Administered 2016-09-06: 100 mL
  Filled 2016-09-06: qty 100

## 2016-09-06 NOTE — ED Triage Notes (Signed)
Pt c/o left arm shooting pains with movement and left finger edema onset today after falling from ladder, caught self on ladder with with left arm, felt pop in left arm. Did not fall to ground or hit head.

## 2016-09-06 NOTE — ED Provider Notes (Signed)
[facility] DEPT Provider Note   CSN: [id number] Arrival date & time: 09/06/16  1449  By signing my name below, I, Sean Mullins, attest that this documentation has been prepared under the direction and in the presence of [name], PA-C. Electronically Signed: Sonum Mullins, Education administrator. 09/06/16. 3:45 PM.  History   Chief Complaint Chief Complaint  Patient presents with  . Arm Pain    The history is provided by the patient. No language interpreter was used.     HPI Comments: Sean Mullins is a 66 y.o. male who presents to the Emergency Department complaining of constant left shoulder and upper arm pain that began this morning after an injury. Patient states he was on a ladder when he slipped but he was able to grab a rung, causing his weight to pull on his LUE. He reports associated swelling to his fingers and tingling sensations to the left third and fourth fingers. He reports pain to his armpit. He denies weakness. He denies falling.  He has tried ibuprofen with some relief. He denies numbness, weakness, or other injury.   Past Medical History:  Diagnosis Date  . Anxiety   . Arthritis   . Diverticula of colon   . TIA (transient ischemic attack)    November 2013    Patient Active Problem List   Diagnosis Date Noted  . BPPV (benign paroxysmal positional vertigo) 01/25/2016  . Essential hypertension 04/29/2015  . TIA (transient ischemic attack) 04/29/2015  . Hyperlipidemia 04/29/2015  . Diabetes ([place]) 04/29/2015  . NOCTURIA 08/12/2009  . OVERWEIGHT 05/17/2008  . OBSTRUCTIVE SLEEP APNEA 05/17/2008  . RESTLESS LEG SYNDROME 05/17/2008  . Coronary atherosclerosis 05/17/2008  . DIVERTICULITIS, COLON 05/17/2008  . DEGENERATIVE JOINT DISEASE 05/17/2008    Past Surgical History:  Procedure Laterality Date  . DUODENAL DIVERTICULECTOMY     2003  . KNEE ARTHROPLASTY Left        Home Medications    Prior to Admission medications   Medication Sig Start Date End Date  Taking? Authorizing Provider  aspirin EC 81 MG tablet Take 1 tablet (81 mg total) by mouth daily. 04/26/15  Yes [name], MD  celecoxib (CELEBREX) 200 MG capsule Take 1 capsule (200 mg total) by mouth 2 (two) times daily. Patient taking differently: Take 200 mg by mouth daily.  06/25/16  Yes [name], MD  lisinopril (PRINIVIL,ZESTRIL) 10 MG tablet Take 1 tablet (10 mg total) by mouth daily. 08/30/16  Yes [name], MD  potassium gluconate 595 MG TABS tablet Take 1,190 mg by mouth daily.   Yes Historical Provider, MD  rosuvastatin (CRESTOR) 10 MG tablet Take 1 tablet (10 mg total) by mouth daily. 08/30/16  Yes [name], MD  blood glucose meter kit and supplies KIT Dispense based on patient and insurance preference. Use daily and as needed.  NIDDM 04/29/15   [name], MD  meclizine (ANTIVERT) 25 MG tablet Take 1 tablet (25 mg total) by mouth 3 (three) times daily as needed for dizziness. 01/09/16   [name], MD  naproxen (NAPROSYN) 250 MG tablet Take 1 tablet (250 mg total) by mouth 2 (two) times daily with a meal. 09/06/16   [name], PA-C    Family History History reviewed. No pertinent family history.  Social History Social History  Substance Use Topics  . Smoking status: Former Smoker    Packs/day: 1.50    Years: 30.00    Quit date: 06/07/1996  . Smokeless tobacco: Not on  file  . Alcohol use Yes     Comment: ocassional      Allergies   Patient has no known allergies.   Review of Systems Review of Systems  Constitutional: Negative for chills and fever.  HENT: Negative for congestion and sore throat.   Eyes: Negative for visual disturbance.  Respiratory: Negative for cough and shortness of breath.   Cardiovascular: Negative for chest pain.  Gastrointestinal: Negative for abdominal pain, diarrhea, nausea and vomiting.  Genitourinary: Negative for dysuria.  Musculoskeletal: Positive for arthralgias and joint swelling. Negative for back pain, neck pain and  neck stiffness.  Skin: Negative for rash and wound.  Neurological: Negative for dizziness, syncope, weakness, numbness and headaches.       +paresthesia  All other systems reviewed and are negative.    Physical Exam Updated Vital Signs BP (!) 147/83 (BP Location: Right Arm)   Pulse (!) 58   Temp 97.7 F (36.5 C) (Oral)   Resp 20   Ht _0  (1.88 m)   Wt 115.7 kg   SpO2 98%   BMI 32.74 kg/m   Physical Exam  Constitutional: He is oriented to person, place, and time. He appears well-developed and well-nourished. No distress.  Nontoxic appearing.  HENT:  Head: Normocephalic and atraumatic.  Mouth/Throat: Oropharynx is clear and moist.  Eyes: Conjunctivae are normal. Pupils are equal, round, and reactive to light. Right eye exhibits no discharge. Left eye exhibits no discharge.  Neck: Normal range of motion. Neck supple. No JVD present. No tracheal deviation present.  Cardiovascular: Normal rate, regular rhythm and normal heart sounds.   Diminished left radial pulse and 2+ right radial pulse.  Good capillary refill to his bilateral distal fingertips.  Pulmonary/Chest: Effort normal and breath sounds normal. No stridor. No respiratory distress.  Abdominal: Soft. There is no tenderness.  Musculoskeletal: Normal range of motion. He exhibits edema and tenderness. He exhibits no deformity.  Mild left finger edema noted. Good cap refill to left fingers. No left wrist or elbow tenderness. No left shoulder tenderness to palpation. Tenderness through left axilla. No left clavicle tenderness or deformity.   Lymphadenopathy:    He has no cervical adenopathy.  Neurological: He is alert and oriented to person, place, and time. No cranial nerve deficit or sensory deficit. He exhibits normal muscle tone. Coordination normal.  Good grip strengths bilaterally. Good strength in his bilateral upper extremities. Sensation is intact to his bilateral distal fingertips.  Skin: Skin is warm and dry.  Capillary refill takes less than 2 seconds. No rash noted. He is not diaphoretic. No erythema. No pallor.  Psychiatric: He has a normal mood and affect. His behavior is normal.  Nursing note and vitals reviewed.    ED Treatments / Results  DIAGNOSTIC STUDIES: Oxygen Saturation is 100% on RA, normal by my interpretation.    COORDINATION OF CARE: 3:38 PM Discussed treatment plan with pt at bedside and pt agreed to plan.  3:51 PM Consulted radiology who recommends getting CT angio gram of the LUE.    Labs (all labs ordered are listed, but only abnormal results are displayed) Labs Reviewed  CBC WITH DIFFERENTIAL/PLATELET - Abnormal; Notable for the following:       Result Value   MCHC 36.5 (*)    All other components within normal limits  BASIC METABOLIC PANEL  I-STAT CHEM 8, ED    EKG  EKG Interpretation None       Radiology Ct Angio Up Extrem Left W &/or  Wo Contast  Result Date: 09/06/2016 CLINICAL DATA:  Patient fell off ladder catching himself with left arm. Left hand and finger tingling and swelling. Diminished left radial pulse. Complaining of left shoulder pain. EXAM: CT ANGIOGRAPHY UPPER LEFT EXTREMITY<Procedure Description>CT ANGIOGRAPHY UPPER LEFT EXTREMITY TECHNIQUE: Imaging from aortic arch through the left wrist during arterial phase postcontrast. Reformats are provided. CONTRAST:  100 cc Isovue 370 IV COMPARISON:  None. FINDINGS: Minimal aortic arch atherosclerosis without dissection or aneurysm. Great vessels demonstrate minimal atherosclerosis at the origin of the right subclavian. Left vertebral artery directly takes off from the aortic arch. The left subclavian, axillary, radial, radial and ulnar arteries are patent in appearance without evidence of occlusion. No soft tissue hemorrhage is identified. Visualized chest: No acute pulmonary abnormality. No pneumothorax or pulmonary consolidation. Osseous: There is bilateral lower cervical facet arthropathy from C3-4 on  the right the C7-T1 bilaterally. No fracture of the visualized sternum, upper ribs, clavicle, left scapula or humerus. Small epicondylar ossifications are noted medially and lateral consistent with sequela of chronic epicondylitis. No acute fracture of the radius nor ulna. The carpal rows are maintained. Review of the MIP images confirms the above findings. IMPRESSION: 1. Patent left upper arterial system as above. 2. Medial and lateral epicondylar ossifications, likely representing changes attributable to chronic epicondylitis. No acute osseous abnormality. 3. Minimal aortic atherosclerosis. Electronically Signed   By: Ashley Royalty M.D.   On: 09/06/2016 18:46    Procedures Procedures (including critical care time)  Medications Ordered in ED Medications  iopamidol (ISOVUE-370) 76 % injection (100 mLs  Contrast Given 09/06/16 1700)     Initial Impression / Assessment and Plan / ED Course  I have reviewed the triage vital signs and the nursing notes.  Pertinent labs & imaging results that were available during my care of the patient were reviewed by me and considered in my medical decision making (see chart for details).    This is a 66 y.o. male who presents to the Emergency Department complaining of constant left shoulder and upper arm pain that began this morning after an injury. Patient states he was on a ladder when he slipped but he was able to grab a rung, causing his weight to pull on his LUE. He reports associated swelling to his fingers and tingling sensations to the left third and fourth fingers. He reports pain to his armpit. He denies weakness. He denies falling. On exam the patient is afebrile nontoxic appearing. He has no visible signs of injury arch, on exam. He has good range of motion of his shoulder, elbow and left wrist. He has good grip strengths bilaterally. Patient has diminished left radial pulse on exam. His right radial pulse is 2+. Despite this patient has good capillary  refill bilaterally. No altered sensation on exam. He does complain of tingling to his left third and fourth fingers. I'm concerned for brachial plexus injury and/or vascular injury. I consulted with radiology who recommended CT angiogram of his left upper extremity. This revealed patent left upper arterial system. No evidence of acute osseous abnormality. At reexamination patient's left radial pulse has improved. He has 1+ left radial pulse and 2+ right radial pulse. Suspect vascular spasm. Also suspect Stinger injury due to the injury today. Patient reports he is feeling better. He still has very good capillary refill. I discussed strict and specific return precautions with the patient. I advised if he develops weakness or trouble moving his arm he needs to return to the emergency department.  I encouraged him to follow-up with primary care and provided him with naproxen for pain control. I advised the patient to follow-up with their primary care provider this week. I advised the patient to return to the emergency department with new or worsening symptoms or new concerns. The patient verbalized understanding and agreement with plan.    This patient was discussed with Dr. Ellender Hose who aggress with assessment and plan.   Final Clinical Impressions(s) / ED Diagnoses   Final diagnoses:  Arm pain  Left arm pain  Acute pain of left shoulder  Vascular spasm (HCC)  Paresthesia    New Prescriptions New Prescriptions   NAPROXEN (NAPROSYN) 250 MG TABLET    Take 1 tablet (250 mg total) by mouth 2 (two) times daily with a meal.   I personally performed the services described in this documentation, which was scribed in my presence. The recorded information has been reviewed and is accurate.      [name], PA-C 09/06/16 1931    Duffy Bruce, MD 09/08/16 1344

## 2016-09-06 NOTE — Discharge Instructions (Signed)
We were concerned for vascular injury in your arm today. CT angiogram showed good blood flow. Please follow up with primary care. If you develop weakness in your arm or hand please return to the ER.

## 2016-09-06 NOTE — ED Notes (Signed)
PT DISCHARGED. INSTRUCTIONS AND PRESCRIPTION GIVEN. AAOX4. PT IN NO APPARENT DISTRESS. THE OPPORTUNITY TO ASK QUESTIONS WAS PROVIDED. 

## 2017-02-09 ENCOUNTER — Other Ambulatory Visit: Payer: Self-pay | Admitting: Internal Medicine

## 2017-02-22 ENCOUNTER — Ambulatory Visit (INDEPENDENT_AMBULATORY_CARE_PROVIDER_SITE_OTHER): Payer: BLUE CROSS/BLUE SHIELD | Admitting: Internal Medicine

## 2017-02-22 ENCOUNTER — Other Ambulatory Visit (INDEPENDENT_AMBULATORY_CARE_PROVIDER_SITE_OTHER): Payer: BLUE CROSS/BLUE SHIELD

## 2017-02-22 ENCOUNTER — Encounter: Payer: Self-pay | Admitting: Internal Medicine

## 2017-02-22 VITALS — BP 142/80 | HR 58 | Temp 98.0°F | Resp 16 | Ht 74.0 in | Wt 262.0 lb

## 2017-02-22 DIAGNOSIS — I10 Essential (primary) hypertension: Secondary | ICD-10-CM

## 2017-02-22 DIAGNOSIS — E119 Type 2 diabetes mellitus without complications: Secondary | ICD-10-CM

## 2017-02-22 DIAGNOSIS — M150 Primary generalized (osteo)arthritis: Secondary | ICD-10-CM

## 2017-02-22 DIAGNOSIS — Z23 Encounter for immunization: Secondary | ICD-10-CM

## 2017-02-22 DIAGNOSIS — M159 Polyosteoarthritis, unspecified: Secondary | ICD-10-CM

## 2017-02-22 DIAGNOSIS — E7800 Pure hypercholesterolemia, unspecified: Secondary | ICD-10-CM | POA: Diagnosis not present

## 2017-02-22 LAB — COMPREHENSIVE METABOLIC PANEL
ALBUMIN: 4.4 g/dL (ref 3.5–5.2)
ALK PHOS: 47 U/L (ref 39–117)
ALT: 47 U/L (ref 0–53)
AST: 28 U/L (ref 0–37)
BILIRUBIN TOTAL: 1.1 mg/dL (ref 0.2–1.2)
BUN: 14 mg/dL (ref 6–23)
CO2: 28 mEq/L (ref 19–32)
Calcium: 9.7 mg/dL (ref 8.4–10.5)
Chloride: 104 mEq/L (ref 96–112)
Creatinine, Ser: 0.82 mg/dL (ref 0.40–1.50)
GFR: 100.53 mL/min (ref >60.00)
Glucose, Bld: 92 mg/dL (ref 70–99)
POTASSIUM: 3.9 meq/L (ref 3.5–5.1)
Sodium: 139 mEq/L (ref 135–145)
TOTAL PROTEIN: 7.4 g/dL (ref 6.0–8.3)

## 2017-02-22 LAB — LIPID PANEL
CHOLESTEROL: 107 mg/dL (ref 0–200)
HDL: 51.8 mg/dL (ref >39.00)
LDL Cholesterol: 40 mg/dL (ref 0–99)
NonHDL: 54.71
Total CHOL/HDL Ratio: 2
Triglycerides: 72 mg/dL (ref 0.0–149.0)
VLDL: 14.4 mg/dL (ref 0.0–40.0)

## 2017-02-22 LAB — HEMOGLOBIN A1C: Hgb A1c MFr Bld: 5.5 % (ref 4.6–6.5)

## 2017-02-22 MED ORDER — MECLIZINE HCL 25 MG PO TABS: 25.0000 mg | ORAL_TABLET | Freq: Three times a day (TID) | ORAL | 1 refills | Status: DC | PRN

## 2017-02-22 MED ORDER — CELECOXIB 200 MG PO CAPS: 200.0000 mg | ORAL_CAPSULE | Freq: Two times a day (BID) | ORAL | 3 refills | Status: DC

## 2017-02-22 MED ORDER — LISINOPRIL 10 MG PO TABS: 10.0000 mg | ORAL_TABLET | Freq: Every day | ORAL | 1 refills | Status: DC

## 2017-02-22 MED ORDER — ROSUVASTATIN CALCIUM 10 MG PO TABS: 10.0000 mg | ORAL_TABLET | Freq: Every day | ORAL | 1 refills | Status: DC

## 2017-02-22 NOTE — Patient Instructions (Addendum)
  Test(s) ordered today. Your results will be released to [facility] (or called to you) after review, usually within 72hours after test completion. If any changes need to be made, you will be notified at that same time.  All other Health Maintenance issues reviewed.   All recommended immunizations and age-appropriate screenings are up-to-date or discussed.  Flu  immunization administered today.   Medications reviewed and updated.   No changes recommended at this time.  Your prescription(s) have been submitted to your pharmacy. Please take as directed and contact our office if you believe you are having problem(s) with the medication(s).   Please followup in 6 months for a physical

## 2017-02-22 NOTE — Progress Notes (Signed)
Subjective:    Patient ID: Sean Mullins, male    DOB: August 30, 1950, 66 y.o.   MRN: [id number]  HPI The patient is here for follow up.  He just retired last week.    Hypertension: He is taking his medication daily. He is compliant with a low sodium diet.  He denies chest pain, palpitations, edema, shortness of breath and regular headaches. He is not exercising regularly.  He does not monitor his blood pressure at home.    Hyperlipidemia: He is taking his medication daily. He is compliant with a low fat/cholesterol diet. He is not exercising regularly. He denies myalgias.   Diabetes: He is controlling sugars with diet. He is compliant with a diabetic diet. He is very active, but exercising regularly. He has not checked his sugars recently. He checks his feet daily and denies foot lesions. He is up-to-date with an ophthalmology examination.   Osteoarthritis:  He has arthritis in multiple joints.  He has seen ortho.  He is taking celebrex and it helps.  He would like a refill.   Medications and allergies reviewed with patient and updated if appropriate.  Patient Active Problem List   Diagnosis Date Noted  . BPPV (benign paroxysmal positional vertigo) 01/25/2016  . Essential hypertension 04/29/2015  . TIA (transient ischemic attack) 04/29/2015  . Hyperlipidemia 04/29/2015  . Diabetes ([place]) 04/29/2015  . NOCTURIA 08/12/2009  . OVERWEIGHT 05/17/2008  . OBSTRUCTIVE SLEEP APNEA 05/17/2008  . RESTLESS LEG SYNDROME 05/17/2008  . Coronary atherosclerosis 05/17/2008  . DIVERTICULITIS, COLON 05/17/2008  . DEGENERATIVE JOINT DISEASE 05/17/2008    Current Outpatient Prescriptions on File Prior to Visit  Medication Sig Dispense Refill  . aspirin EC 81 MG tablet Take 1 tablet (81 mg total) by mouth daily. 30 tablet 0  . blood glucose meter kit and supplies KIT Dispense based on patient and insurance preference. Use daily and as needed.  NIDDM 1 each 0  . celecoxib (CELEBREX) 200 MG  capsule Take 1 capsule (200 mg total) by mouth 2 (two) times daily. (Patient taking differently: Take 200 mg by mouth daily. ) 60 capsule 3  . lisinopril (PRINIVIL,ZESTRIL) 10 MG tablet Take 1 tablet (10 mg total) by mouth daily. 90 tablet 1  . meclizine (ANTIVERT) 25 MG tablet Take 1 tablet (25 mg total) by mouth 3 (three) times daily as needed for dizziness. 30 tablet 0  . naproxen (NAPROSYN) 250 MG tablet Take 1 tablet (250 mg total) by mouth 2 (two) times daily with a meal. 30 tablet 0  . potassium gluconate 595 MG TABS tablet Take 1,190 mg by mouth daily.    . rosuvastatin (CRESTOR) 10 MG tablet Take 1 tablet (10 mg total) by mouth daily. 90 tablet 1   No current facility-administered medications on file prior to visit.     Past Medical History:  Diagnosis Date  . Anxiety   . Arthritis   . Diverticula of colon   . TIA (transient ischemic attack)    November 2013    Past Surgical History:  Procedure Laterality Date  . DUODENAL DIVERTICULECTOMY     2003  . KNEE ARTHROPLASTY Left     Social History   Social History  . Marital status: Married    Spouse name: N/A  . Number of children: N/A  . Years of education: N/A   Social History Main Topics  . Smoking status: Former Smoker    Packs/day: 1.50    Years: 30.00    Quit  date: 06/07/1996  . Smokeless tobacco: Not on file  . Alcohol use Yes     Comment: ocassional   . Drug use: Unknown  . Sexual activity: Not on file   Other Topics Concern  . Not on file   Social History Narrative   No regular exercise.     No family history on file.  Review of Systems  Constitutional: Negative for chills and fever.  Respiratory: Negative for cough, shortness of breath and wheezing.   Cardiovascular: Negative for chest pain, palpitations and leg swelling.  Neurological: Negative for light-headedness and headaches.       Objective:   Vitals:   02/22/17 1355  BP: (!) 142/80  Pulse: (!) 58  Resp: 16  Temp: 98 F (36.7 C)    SpO2: 98%   Wt Readings from Last 3 Encounters:  02/22/17 262 lb (118.8 kg)  09/06/16 255 lb (115.7 kg)  04/08/16 252 lb (114.3 kg)   Body mass index is 33.64 kg/m.   Physical Exam    Constitutional: Appears well-developed and well-nourished. No distress.  HENT:  Head: Normocephalic and atraumatic.  Neck: Neck supple. No tracheal deviation present. No thyromegaly present.  No cervical lymphadenopathy Cardiovascular: Normal rate, regular rhythm and normal heart sounds.   No murmur heard. No carotid bruit .  No edema Pulmonary/Chest: Effort normal and breath sounds normal. No respiratory distress. No has no wheezes. No rales.  Skin: Skin is warm and dry. Not diaphoretic.  Psychiatric: Normal mood and affect. Behavior is normal.      Assessment & Plan:    See Problem List for Assessment and Plan of chronic medical problems.

## 2017-02-22 NOTE — Assessment & Plan Note (Signed)
Check lipid panel  Continue daily statin Regular exercise and healthy diet encouraged  

## 2017-02-22 NOTE — Assessment & Plan Note (Addendum)
BP Readings from Last 3 Encounters:  02/22/17 (!) 142/80  09/06/16 (!) 147/83  04/08/16 120/78    Slightly elevated today Will continue same medications Restart regular exercise Work on weight loss cmp

## 2017-02-22 NOTE — Assessment & Plan Note (Signed)
Check a1c Low sugar / carb diet Stressed regular exercise, weight loss  

## 2017-02-22 NOTE — Assessment & Plan Note (Signed)
Taking celebrex once a day - will continue Refilled today

## 2017-07-29 ENCOUNTER — Other Ambulatory Visit: Payer: Self-pay | Admitting: Internal Medicine

## 2017-08-10 ENCOUNTER — Other Ambulatory Visit: Payer: Self-pay | Admitting: Internal Medicine

## 2017-08-10 MED ORDER — CELECOXIB 200 MG PO CAPS: 200.0000 mg | ORAL_CAPSULE | Freq: Two times a day (BID) | ORAL | 0 refills | Status: DC

## 2017-08-10 NOTE — Telephone Encounter (Signed)
Per chart pt is due for appt in April sent 30 day script until f/u appt.Marland KitchenJohny Chess

## 2017-08-10 NOTE — Telephone Encounter (Signed)
LOV 02/22/17 Dr. Quay Burow [facility]

## 2017-08-10 NOTE — Telephone Encounter (Signed)
Copied from [place] [phone]. Topic: Quick Communication - Rx Refill/Question >> Aug 10, 2017  9:24 AM Cecelia Byars, NT wrote: Medication: celecoxib (CELEBREX) 200 MG capsule Has the patient contacted their pharmacy? yes  (Agent: If no, request that the patient contact the pharmacy for the refill. Preferred Pharmacy (with phone number or street name)[id number] ,[place] ,[place] [phone] Agent: Please be advised that RX refills may take up to 3 business days. We ask that you follow-up with your pharmacy. The pharmacy called and said they need a new prescription sent to fill the above

## 2017-08-11 ENCOUNTER — Other Ambulatory Visit: Payer: Self-pay | Admitting: Internal Medicine

## 2017-08-23 NOTE — Progress Notes (Signed)
Subjective:    Patient ID: Sean Mullins, male    DOB: Apr 12, 1951, 67 y.o.   MRN: [id number]  HPI He is here for a physical exam.   He has gained weight.  He is working on it.  He has started walking again.   He has no other concerns.  Medications and allergies reviewed with patient and updated if appropriate.  Patient Active Problem List   Diagnosis Date Noted  . BPPV (benign paroxysmal positional vertigo) 01/25/2016  . Essential hypertension 04/29/2015  . TIA (transient ischemic attack) 04/29/2015  . Hyperlipidemia 04/29/2015  . Diabetes ([place]) 04/29/2015  . NOCTURIA 08/12/2009  . OVERWEIGHT 05/17/2008  . OBSTRUCTIVE SLEEP APNEA 05/17/2008  . RESTLESS LEG SYNDROME 05/17/2008  . Coronary atherosclerosis 05/17/2008  . DIVERTICULITIS, COLON 05/17/2008  . Osteoarthritis 05/17/2008    Current Outpatient Medications on File Prior to Visit  Medication Sig Dispense Refill  . aspirin EC 81 MG tablet Take 1 tablet (81 mg total) by mouth daily. 30 tablet 0  . blood glucose meter kit and supplies KIT Dispense based on patient and insurance preference. Use daily and as needed.  NIDDM 1 each 0  . celecoxib (CELEBREX) 200 MG capsule Take 1 capsule (200 mg total) by mouth 2 (two) times daily. Follow-up appt due in April must see provider for future refills 30 capsule 0  . lisinopril (PRINIVIL,ZESTRIL) 10 MG tablet TAKE 1 TABLET BY MOUTH ONCE DAILY 90 tablet 0  . meclizine (ANTIVERT) 25 MG tablet Take 1 tablet (25 mg total) by mouth 3 (three) times daily as needed for dizziness. 90 tablet 1  . potassium gluconate 595 MG TABS tablet Take 1,190 mg by mouth daily.    . rosuvastatin (CRESTOR) 10 MG tablet TAKE 1 TABLET BY MOUTH ONCE DAILY 90 tablet 0   No current facility-administered medications on file prior to visit.     Past Medical History:  Diagnosis Date  . Anxiety   . Arthritis   . Diverticula of colon   . TIA (transient ischemic attack)    November 2013    Past  Surgical History:  Procedure Laterality Date  . DUODENAL DIVERTICULECTOMY     2003  . KNEE ARTHROPLASTY Left     Social History   Socioeconomic History  . Marital status: Married    Spouse name: None  . Number of children: None  . Years of education: None  . Highest education level: None  Social Needs  . Financial resource strain: None  . Food insecurity - worry: None  . Food insecurity - inability: None  . Transportation needs - medical: None  . Transportation needs - non-medical: None  Occupational History  . None  Tobacco Use  . Smoking status: Former Smoker    Packs/day: 1.50    Years: 30.00    Pack years: 45.00    Last attempt to quit: 06/07/1996    Years since quitting: 21.2  . Smokeless tobacco: Never Used  Substance and Sexual Activity  . Alcohol use: Yes    Comment: ocassional   . Drug use: None  . Sexual activity: None  Other Topics Concern  . None  Social History Narrative   No regular exercise.     No family history on file.  Review of Systems  Constitutional: Negative for chills and fever.  Eyes: Negative for visual disturbance.  Respiratory: Negative for cough, shortness of breath and wheezing.   Cardiovascular: Negative for chest pain, palpitations and leg swelling.  Gastrointestinal: Negative for abdominal pain, blood in stool, constipation, diarrhea and nausea.       No gerd  Genitourinary: Negative for dysuria and hematuria.  Musculoskeletal: Positive for arthralgias (arthritis). Negative for back pain.  Skin: Negative for color change.  Neurological: Negative for light-headedness and headaches.  Psychiatric/Behavioral: Negative for dysphoric mood. The patient is not nervous/anxious.        Objective:   Vitals:   08/24/17 0917  BP: 134/82  Pulse: (!) 57  Resp: 16  Temp: 97.8 F (36.6 C)  SpO2: 98%   Filed Weights   08/24/17 0917  Weight: 268 lb (121.6 kg)   Body mass index is 34.41 kg/m.  Wt Readings from Last 3 Encounters:    08/24/17 268 lb (121.6 kg)  02/22/17 262 lb (118.8 kg)  09/06/16 255 lb (115.7 kg)     Physical Exam Constitutional: He appears well-developed and well-nourished. No distress.  HENT:  Head: Normocephalic and atraumatic.  Right Ear: External ear normal.  Left Ear: External ear normal.  Mouth/Throat: Oropharynx is clear and moist.  Normal ear canals and TM b/l  Eyes: Conjunctivae and EOM are normal.  Neck: Neck supple. No tracheal deviation present. No thyromegaly present.  No carotid bruit  Cardiovascular: Normal rate, regular rhythm, normal heart sounds and intact distal pulses.   No murmur heard. Pulmonary/Chest: Effort normal and breath sounds normal. No respiratory distress. He has no wheezes. He has no rales.  Abdominal: Soft. He exhibits no distension. There is no tenderness.  Genitourinary: deferred  Musculoskeletal: He exhibits no edema.  Lymphadenopathy:   He has no cervical adenopathy.  Skin: Skin is warm and dry. He is not diaphoretic.  Psychiatric: He has a normal mood and affect. His behavior is normal.         Assessment & Plan:   Physical exam: Screening blood work   ordered Immunizations   discussed shingles vaccine,  Others up to date Colonoscopy  Due -  Declined any additional colonoscopies, will consider cologuard  Eye exams   - due - will schedule EKG    Last done 01/2016 Exercise   Walking every day - 25-35 minutes Weight  Working on weight loss Skin    No concerns Substance abuse   none  See Problem List for Assessment and Plan of chronic medical problems.    FU  in 6 months

## 2017-08-23 NOTE — Patient Instructions (Addendum)
Test(s) ordered today. Your results will be released to MyChart (or called to you) after review, usually within 72hours after test completion. If any changes need to be made, you will be notified at that same time.  All other Health Maintenance issues reviewed.   All recommended immunizations and age-appropriate screenings are up-to-date or discussed.  No immunizations administered today.   Medications reviewed and updated.  No changes recommended at this time.   Please followup in 6 months    Health Maintenance, Male A healthy lifestyle and preventive care is important for your health and wellness. Ask your health care provider about what schedule of regular examinations is right for you. What should I know about weight and diet? Eat a Healthy Diet  Eat plenty of vegetables, fruits, whole grains, low-fat dairy products, and lean protein.  Do not eat a lot of foods high in solid fats, added sugars, or salt.  Maintain a Healthy Weight Regular exercise can help you achieve or maintain a healthy weight. You should:  Do at least 150 minutes of exercise each week. The exercise should increase your heart rate and make you sweat (moderate-intensity exercise).  Do strength-training exercises at least twice a week.  Watch Your Levels of Cholesterol and Blood Lipids  Have your blood tested for lipids and cholesterol every 5 years starting at 65 years of age. If you are at high risk for heart disease, you should start having your blood tested when you are 65 years old. You may need to have your cholesterol levels checked more often if: ? Your lipid or cholesterol levels are high. ? You are older than 65 years of age. ? You are at high risk for heart disease.  What should I know about cancer screening? Many types of cancers can be detected early and may often be prevented. Lung Cancer  You should be screened every year for lung cancer if: ? You are a current smoker who has smoked for  at least 30 years. ? You are a former smoker who has quit within the past 15 years.  Talk to your health care provider about your screening options, when you should start screening, and how often you should be screened.  Colorectal Cancer  Routine colorectal cancer screening usually begins at 65 years of age and should be repeated every 5-10 years until you are 65 years old. You may need to be screened more often if early forms of precancerous polyps or small growths are found. Your health care provider may recommend screening at an earlier age if you have risk factors for colon cancer.  Your health care provider may recommend using home test kits to check for hidden blood in the stool.  A small camera at the end of a tube can be used to examine your colon (sigmoidoscopy or colonoscopy). This checks for the earliest forms of colorectal cancer.  Prostate and Testicular Cancer  Depending on your age and overall health, your health care provider may do certain tests to screen for prostate and testicular cancer.  Talk to your health care provider about any symptoms or concerns you have about testicular or prostate cancer.  Skin Cancer  Check your skin from head to toe regularly.  Tell your health care provider about any new moles or changes in moles, especially if: ? There is a change in a mole's size, shape, or color. ? You have a mole that is larger than a pencil eraser.  Always use sunscreen. Apply sunscreen liberally   and repeat throughout the day.  Protect yourself by wearing long sleeves, pants, a wide-brimmed hat, and sunglasses when outside.  What should I know about heart disease, diabetes, and high blood pressure?  If you are 18-39 years of age, have your blood pressure checked every 3-5 years. If you are 40 years of age or older, have your blood pressure checked every year. You should have your blood pressure measured twice-once when you are at a hospital or clinic, and once  when you are not at a hospital or clinic. Record the average of the two measurements. To check your blood pressure when you are not at a hospital or clinic, you can use: ? An automated blood pressure machine at a pharmacy. ? A home blood pressure monitor.  Talk to your health care provider about your target blood pressure.  If you are between 45-79 years old, ask your health care provider if you should take aspirin to prevent heart disease.  Have regular diabetes screenings by checking your fasting blood sugar level. ? If you are at a normal weight and have a low risk for diabetes, have this test once every three years after the age of 45. ? If you are overweight and have a high risk for diabetes, consider being tested at a younger age or more often.  A one-time screening for abdominal aortic aneurysm (AAA) by ultrasound is recommended for men aged 65-75 years who are current or former smokers. What should I know about preventing infection? Hepatitis B If you have a higher risk for hepatitis B, you should be screened for this virus. Talk with your health care provider to find out if you are at risk for hepatitis B infection. Hepatitis C Blood testing is recommended for:  Everyone born from 1945 through 1965.  Anyone with known risk factors for hepatitis C.  Sexually Transmitted Diseases (STDs)  You should be screened each year for STDs including gonorrhea and chlamydia if: ? You are sexually active and are younger than 65 years of age. ? You are older than 65 years of age and your health care provider tells you that you are at risk for this type of infection. ? Your sexual activity has changed since you were last screened and you are at an increased risk for chlamydia or gonorrhea. Ask your health care provider if you are at risk.  Talk with your health care provider about whether you are at high risk of being infected with HIV. Your health care provider may recommend a prescription  medicine to help prevent HIV infection.  What else can I do?  Schedule regular health, dental, and eye exams.  Stay current with your vaccines (immunizations).  Do not use any tobacco products, such as cigarettes, chewing tobacco, and e-cigarettes. If you need help quitting, ask your health care provider.  Limit alcohol intake to no more than 2 drinks per day. One drink equals 12 ounces of beer, 5 ounces of wine, or 1 ounces of hard liquor.  Do not use street drugs.  Do not share needles.  Ask your health care provider for help if you need support or information about quitting drugs.  Tell your health care provider if you often feel depressed.  Tell your health care provider if you have ever been abused or do not feel safe at home. This information is not intended to replace advice given to you by your health care provider. Make sure you discuss any questions you have with your health   care provider. Document Released: 11/20/2007 Document Revised: 01/21/2016 Document Reviewed: 02/25/2015 Elsevier Interactive Patient Education  2018 [employer] Inc.  

## 2017-08-24 ENCOUNTER — Encounter: Payer: Self-pay | Admitting: Internal Medicine

## 2017-08-24 ENCOUNTER — Ambulatory Visit (INDEPENDENT_AMBULATORY_CARE_PROVIDER_SITE_OTHER): Payer: BLUE CROSS/BLUE SHIELD | Admitting: Internal Medicine

## 2017-08-24 ENCOUNTER — Other Ambulatory Visit (INDEPENDENT_AMBULATORY_CARE_PROVIDER_SITE_OTHER): Payer: BLUE CROSS/BLUE SHIELD

## 2017-08-24 VITALS — BP 134/82 | HR 57 | Temp 97.8°F | Resp 16 | Ht 74.0 in | Wt 268.0 lb

## 2017-08-24 DIAGNOSIS — Z0000 Encounter for general adult medical examination without abnormal findings: Secondary | ICD-10-CM

## 2017-08-24 DIAGNOSIS — E119 Type 2 diabetes mellitus without complications: Secondary | ICD-10-CM

## 2017-08-24 DIAGNOSIS — I2510 Atherosclerotic heart disease of native coronary artery without angina pectoris: Secondary | ICD-10-CM

## 2017-08-24 DIAGNOSIS — I10 Essential (primary) hypertension: Secondary | ICD-10-CM

## 2017-08-24 DIAGNOSIS — E7849 Other hyperlipidemia: Secondary | ICD-10-CM | POA: Diagnosis not present

## 2017-08-24 LAB — CBC WITH DIFFERENTIAL/PLATELET
BASOS ABS: 0 10*3/uL (ref 0.0–0.1)
Basophils Relative: 0.4 % (ref 0.0–3.0)
EOS PCT: 2.1 % (ref 0.0–5.0)
Eosinophils Absolute: 0.1 10*3/uL (ref 0.0–0.7)
HEMATOCRIT: 45.8 % (ref 39.0–52.0)
HEMOGLOBIN: 15.8 g/dL (ref 13.0–17.0)
LYMPHS PCT: 46.1 % — AB (ref 12.0–46.0)
Lymphs Abs: 2.4 10*3/uL (ref 0.7–4.0)
MCHC: 34.6 g/dL (ref 30.0–36.0)
MCV: 93.1 fl (ref 78.0–100.0)
MONOS PCT: 10.2 % (ref 3.0–12.0)
Monocytes Absolute: 0.5 10*3/uL (ref 0.1–1.0)
Neutro Abs: 2.2 10*3/uL (ref 1.4–7.7)
Neutrophils Relative %: 41.2 % — ABNORMAL LOW (ref 43.0–77.0)
Platelets: 200 10*3/uL (ref 150.0–400.0)
RBC: 4.91 Mil/uL (ref 4.22–5.81)
RDW: 13.1 % (ref 11.5–15.5)
WBC: 5.3 10*3/uL (ref 4.0–10.5)

## 2017-08-24 LAB — COMPREHENSIVE METABOLIC PANEL
ALBUMIN: 4.9 g/dL (ref 3.5–5.2)
ALK PHOS: 50 U/L (ref 39–117)
ALT: 31 U/L (ref 0–53)
AST: 23 U/L (ref 0–37)
BUN: 15 mg/dL (ref 6–23)
CALCIUM: 10.3 mg/dL (ref 8.4–10.5)
CO2: 24 mEq/L (ref 19–32)
Chloride: 104 mEq/L (ref 96–112)
Creatinine, Ser: 0.91 mg/dL (ref 0.40–1.50)
GFR: 89.01 mL/min (ref >60.00)
Glucose, Bld: 121 mg/dL — ABNORMAL HIGH (ref 70–99)
POTASSIUM: 4.9 meq/L (ref 3.5–5.1)
Sodium: 142 mEq/L (ref 135–145)
TOTAL PROTEIN: 7.7 g/dL (ref 6.0–8.3)
Total Bilirubin: 1.1 mg/dL (ref 0.2–1.2)

## 2017-08-24 LAB — LIPID PANEL
Cholesterol: 118 mg/dL (ref 0–200)
HDL: 45.3 mg/dL (ref >39.00)
LDL Cholesterol: 52 mg/dL (ref 0–99)
NonHDL: 73.15
TRIGLYCERIDES: 106 mg/dL (ref 0.0–149.0)
Total CHOL/HDL Ratio: 3
VLDL: 21.2 mg/dL (ref 0.0–40.0)

## 2017-08-24 LAB — HEMOGLOBIN A1C: Hgb A1c MFr Bld: 5.6 % (ref 4.6–6.5)

## 2017-08-24 LAB — TSH: TSH: 1.05 u[IU]/mL (ref 0.35–4.50)

## 2017-08-24 NOTE — Assessment & Plan Note (Signed)
BP well controlled Current regimen effective and well tolerated Continue current medications at current doses cmp  

## 2017-08-24 NOTE — Assessment & Plan Note (Signed)
No chest pain, palpitations, sob Continue asa 81 mg, statin

## 2017-08-24 NOTE — Assessment & Plan Note (Signed)
Check lipid panel  Continue daily statin Regular exercise and healthy diet encouraged  

## 2017-08-24 NOTE — Assessment & Plan Note (Signed)
Diet controlled Check a1c Low sugar / carb diet Stressed regular exercise,weight loss  Will schedule eye exam

## 2017-08-25 LAB — PSA, TOTAL AND FREE
PSA, % Free: 30 % (calc) (ref >25)
PSA, FREE: 0.3 ng/mL
PSA, TOTAL: 1 ng/mL (ref <=4.0)

## 2017-08-31 ENCOUNTER — Telehealth: Payer: Self-pay | Admitting: Internal Medicine

## 2017-08-31 NOTE — Telephone Encounter (Signed)
Called patient to verify his pharmacy. No answer, message left for him to call us back.

## 2017-08-31 NOTE — Telephone Encounter (Signed)
Copied from [place]. Topic: Quick Communication - Rx Refill/Question >> Aug 31, 2017 10:16 AM Robina Ade, Helene Kelp D wrote: Medication: celecoxib (CELEBREX) 200 MG capsule,lisinopril (PRINIVIL,ZESTRIL) 10 MG tablet, rosuvastatin (CRESTOR) 10 MG tablet  Has the patient contacted their pharmacy? Yes (Agent: If no, request that the patient contact the pharmacy for the refill.) Preferred Pharmacy (with phone number or street name): [facility], [place]: Please be advised that RX refills may take up to 3 business days. We ask that you follow-up with your pharmacy.

## 2017-09-01 MED ORDER — ROSUVASTATIN CALCIUM 10 MG PO TABS: 10.0000 mg | ORAL_TABLET | Freq: Every day | ORAL | 1 refills | Status: DC

## 2017-09-01 MED ORDER — CELECOXIB 200 MG PO CAPS: 200.0000 mg | ORAL_CAPSULE | Freq: Two times a day (BID) | ORAL | 5 refills | Status: DC

## 2017-09-01 MED ORDER — LISINOPRIL 10 MG PO TABS: 10.0000 mg | ORAL_TABLET | Freq: Every day | ORAL | 1 refills | Status: DC

## 2017-09-01 NOTE — Telephone Encounter (Signed)
Pt calling back to verify the pharmacy of choice is the [facility].

## 2018-02-23 NOTE — Progress Notes (Signed)
Subjective:    Patient ID: Sean Mullins, male    DOB: 02-Jul-1950, 67 y.o.   MRN: [id number]  HPI The patient is here for follow up.  CAD, Hypertension, h/o TIA: He is taking his medication daily. He is compliant with a low sodium diet.  He denies chest pain, palpitations, edema, shortness of breath and regular headaches. He is not exercising regularly, but will restart.  He does monitor his blood pressure at home - 128-130's.    Hyperlipidemia: He is taking his medication daily. He is compliant with a low fat/cholesterol diet. He is not exercising regularly. He denies myalgias.   Diabetes: He is controlling his sugars with diet. He is compliant with a diabetic diet. He is not exercising regularly, but will restart walking.   He is up-to-date with an ophthalmology examination and due for an appointment and will schedule it.    Medications and allergies reviewed with patient and updated if appropriate.  Patient Active Problem List   Diagnosis Date Noted  . BPPV (benign paroxysmal positional vertigo) 01/25/2016  . Essential hypertension 04/29/2015  . TIA (transient ischemic attack) 04/29/2015  . Hyperlipidemia 04/29/2015  . Diabetes ([place]) 04/29/2015  . NOCTURIA 08/12/2009  . OVERWEIGHT 05/17/2008  . OBSTRUCTIVE SLEEP APNEA 05/17/2008  . RESTLESS LEG SYNDROME 05/17/2008  . Coronary atherosclerosis 05/17/2008  . DIVERTICULITIS, COLON 05/17/2008  . Osteoarthritis 05/17/2008    Current Outpatient Medications on File Prior to Visit  Medication Sig Dispense Refill  . aspirin EC 81 MG tablet Take 1 tablet (81 mg total) by mouth daily. 30 tablet 0  . blood glucose meter kit and supplies KIT Dispense based on patient and insurance preference. Use daily and as needed.  NIDDM 1 each 0  . potassium gluconate 595 MG TABS tablet Take 1,190 mg by mouth daily.     No current facility-administered medications on file prior to visit.     Past Medical History:  Diagnosis Date  . Anxiety    . Arthritis   . Diverticula of colon   . TIA (transient ischemic attack)    November 2013    Past Surgical History:  Procedure Laterality Date  . DUODENAL DIVERTICULECTOMY     2003  . KNEE ARTHROPLASTY Left     Social History   Socioeconomic History  . Marital status: Married    Spouse name: Not on file  . Number of children: Not on file  . Years of education: Not on file  . Highest education level: Not on file  Occupational History  . Not on file  Social Needs  . Financial resource strain: Not on file  . Food insecurity:    Worry: Not on file    Inability: Not on file  . Transportation needs:    Medical: Not on file    Non-medical: Not on file  Tobacco Use  . Smoking status: Former Smoker    Packs/day: 1.50    Years: 30.00    Pack years: 45.00    Last attempt to quit: 06/07/1996    Years since quitting: 21.7  . Smokeless tobacco: Never Used  Substance and Sexual Activity  . Alcohol use: Yes    Comment: ocassional   . Drug use: Not on file  . Sexual activity: Not on file  Lifestyle  . Physical activity:    Days per week: Not on file    Minutes per session: Not on file  . Stress: Not on file  Relationships  . Social  connections:    Talks on phone: Not on file    Gets together: Not on file    Attends religious service: Not on file    Active member of club or organization: Not on file    Attends meetings of clubs or organizations: Not on file    Relationship status: Not on file  Other Topics Concern  . Not on file  Social History Narrative   No regular exercise.     Family History  Problem Relation Age of Onset  . Transient ischemic attack Father     Review of Systems  Constitutional: Negative for chills and fever.  Respiratory: Positive for cough (allergy related). Negative for shortness of breath and wheezing.   Cardiovascular: Negative for chest pain, palpitations and leg swelling.  Neurological: Positive for dizziness (rare). Negative for  light-headedness and headaches.       Objective:   Vitals:   02/24/18 0853  BP: (!) 146/80  Pulse: (!) 58  Resp: 18  Temp: 98.4 F (36.9 C)  SpO2: 96%   BP Readings from Last 3 Encounters:  02/24/18 (!) 146/80  08/24/17 134/82  02/22/17 (!) 142/80   Wt Readings from Last 3 Encounters:  02/24/18 277 lb (125.6 kg)  08/24/17 268 lb (121.6 kg)  02/22/17 262 lb (118.8 kg)   Body mass index is 35.56 kg/m.   Physical Exam    Constitutional: Appears well-developed and well-nourished. No distress.  HENT:  Head: Normocephalic and atraumatic.  Neck: Neck supple. No tracheal deviation present. No thyromegaly present.  No cervical lymphadenopathy Cardiovascular: Normal rate, regular rhythm and normal heart sounds.   No murmur heard. No carotid bruit .  No edema Pulmonary/Chest: Effort normal and breath sounds normal. No respiratory distress. No has no wheezes. No rales.  Skin: Skin is warm and dry. Not diaphoretic.  Psychiatric: Normal mood and affect. Behavior is normal.      Assessment & Plan:    See Problem List for Assessment and Plan of chronic medical problems.

## 2018-02-23 NOTE — Patient Instructions (Addendum)
  Tests ordered today. Your results will be released to MyChart (or called to you) after review, usually within 72hours after test completion. If any changes need to be made, you will be notified at that same time.  Flu immunization administered today.   Medications reviewed and updated.  Changes include :   none  Your prescription(s) have been submitted to your pharmacy. Please take as directed and contact our office if you believe you are having problem(s) with the medication(s).   Please followup in 6 months   

## 2018-02-24 ENCOUNTER — Other Ambulatory Visit (INDEPENDENT_AMBULATORY_CARE_PROVIDER_SITE_OTHER): Payer: PPO

## 2018-02-24 ENCOUNTER — Ambulatory Visit (INDEPENDENT_AMBULATORY_CARE_PROVIDER_SITE_OTHER): Payer: PPO | Admitting: Internal Medicine

## 2018-02-24 ENCOUNTER — Encounter: Payer: Self-pay | Admitting: Internal Medicine

## 2018-02-24 VITALS — BP 146/80 | HR 58 | Temp 98.4°F | Resp 18 | Ht 74.0 in | Wt 277.0 lb

## 2018-02-24 DIAGNOSIS — E7849 Other hyperlipidemia: Secondary | ICD-10-CM | POA: Diagnosis not present

## 2018-02-24 DIAGNOSIS — I2510 Atherosclerotic heart disease of native coronary artery without angina pectoris: Secondary | ICD-10-CM | POA: Diagnosis not present

## 2018-02-24 DIAGNOSIS — H8110 Benign paroxysmal vertigo, unspecified ear: Secondary | ICD-10-CM | POA: Diagnosis not present

## 2018-02-24 DIAGNOSIS — E119 Type 2 diabetes mellitus without complications: Secondary | ICD-10-CM | POA: Diagnosis not present

## 2018-02-24 DIAGNOSIS — I10 Essential (primary) hypertension: Secondary | ICD-10-CM

## 2018-02-24 DIAGNOSIS — M150 Primary generalized (osteo)arthritis: Secondary | ICD-10-CM

## 2018-02-24 DIAGNOSIS — Z23 Encounter for immunization: Secondary | ICD-10-CM | POA: Diagnosis not present

## 2018-02-24 DIAGNOSIS — M159 Polyosteoarthritis, unspecified: Secondary | ICD-10-CM

## 2018-02-24 LAB — COMPREHENSIVE METABOLIC PANEL
ALT: 70 U/L — AB (ref 0–53)
AST: 51 U/L — ABNORMAL HIGH (ref 0–37)
Albumin: 4.5 g/dL (ref 3.5–5.2)
Alkaline Phosphatase: 53 U/L (ref 39–117)
BILIRUBIN TOTAL: 1.3 mg/dL — AB (ref 0.2–1.2)
BUN: 12 mg/dL (ref 6–23)
CALCIUM: 9.8 mg/dL (ref 8.4–10.5)
CO2: 29 meq/L (ref 19–32)
Chloride: 103 mEq/L (ref 96–112)
Creatinine, Ser: 0.86 mg/dL (ref 0.40–1.50)
GFR: 94.86 mL/min (ref >60.00)
Glucose, Bld: 109 mg/dL — ABNORMAL HIGH (ref 70–99)
POTASSIUM: 4.8 meq/L (ref 3.5–5.1)
Sodium: 139 mEq/L (ref 135–145)
Total Protein: 7.6 g/dL (ref 6.0–8.3)

## 2018-02-24 LAB — LIPID PANEL
CHOL/HDL RATIO: 3
Cholesterol: 110 mg/dL (ref 0–200)
HDL: 37.7 mg/dL — AB (ref >39.00)
LDL Cholesterol: 44 mg/dL (ref 0–99)
NONHDL: 71.85
Triglycerides: 141 mg/dL (ref 0.0–149.0)
VLDL: 28.2 mg/dL (ref 0.0–40.0)

## 2018-02-24 LAB — HEMOGLOBIN A1C: Hgb A1c MFr Bld: 5.8 % (ref 4.6–6.5)

## 2018-02-24 MED ORDER — LISINOPRIL 10 MG PO TABS: 10.0000 mg | ORAL_TABLET | Freq: Every day | ORAL | 1 refills | Status: DC

## 2018-02-24 MED ORDER — MECLIZINE HCL 25 MG PO TABS: 25.0000 mg | ORAL_TABLET | Freq: Three times a day (TID) | ORAL | 1 refills | Status: DC | PRN

## 2018-02-24 MED ORDER — ROSUVASTATIN CALCIUM 10 MG PO TABS: 10.0000 mg | ORAL_TABLET | Freq: Every day | ORAL | 1 refills | Status: DC

## 2018-02-24 MED ORDER — CELECOXIB 200 MG PO CAPS: 200.0000 mg | ORAL_CAPSULE | Freq: Two times a day (BID) | ORAL | 3 refills | Status: DC

## 2018-02-24 NOTE — Assessment & Plan Note (Signed)
Taking celebrex daily - once a day Pain controlled

## 2018-02-24 NOTE — Assessment & Plan Note (Signed)
Diet controlled Not exercising - will start walking again Check a1c

## 2018-02-24 NOTE — Assessment & Plan Note (Signed)
BP slightly elevated here today but controlled at home Continue to monitor at home Continue current medications cmp

## 2018-02-24 NOTE — Assessment & Plan Note (Signed)
Check lipid panel  Continue daily statin Regular exercise and healthy diet encouraged  

## 2018-02-24 NOTE — Assessment & Plan Note (Signed)
Has rare vertigo Takes meclizine only as needed

## 2018-02-24 NOTE — Assessment & Plan Note (Signed)
No chest pain, palps or SOB Continue current medications

## 2018-03-15 DIAGNOSIS — H60501 Unspecified acute noninfective otitis externa, right ear: Secondary | ICD-10-CM | POA: Diagnosis not present

## 2018-04-10 LAB — HM DIABETES EYE EXAM

## 2018-07-28 ENCOUNTER — Other Ambulatory Visit: Payer: Self-pay | Admitting: Internal Medicine

## 2018-08-17 ENCOUNTER — Other Ambulatory Visit: Payer: Self-pay | Admitting: Internal Medicine

## 2018-08-26 NOTE — Patient Instructions (Addendum)
Sean Mullins , Thank you for taking time to come for your Medicare Wellness Visit. I appreciate your ongoing commitment to your health goals. Please review the following plan we discussed and let me know if I can assist you in the future.   These are the goals we discussed: Goals   Working weight loss, increase your exercise     This is a list of the screening recommended for you and due dates:  Health Maintenance  Topic Date Due  . Eye exam for diabetics  02/20/1963  . Complete foot exam   04/08/2017  . Pneumonia vaccines (1 of 2 - PCV13) today  . Hemoglobin A1C  08/25/2018  . Colon Cancer Screening  08/24/2028*  . Tetanus Vaccine  06/08/2023  . Flu Shot  Completed  .  Hepatitis C: One time screening is recommended by Center for Disease Control  (CDC) for  adults born from [birth year] through 1963.   Completed  . HIV Screening  Completed  *Topic was postponed. The date shown is not the original due date.     Tests ordered today. Your results will be released to [place] (or called to you) after review, usually within 72hours after test completion. If any changes need to be made, you will be notified at that same time.  All other Health Maintenance issues reviewed.   All recommended immunizations and age-appropriate screenings are up-to-date or discussed.  prevnar immunization administered today.   Medications reviewed and updated.  Changes include :   Increase lisinopril to 20 mg daily  Your prescription(s) have been submitted to your pharmacy. Please take as directed and contact our office if you believe you are having problem(s) with the medication(s).   Please followup in 6 months    Health Maintenance, Male A healthy lifestyle and preventive care is important for your health and wellness. Ask your health care provider about what schedule of regular examinations is right for you. What should I know about weight and diet? Eat a Healthy Diet  Eat plenty of vegetables, fruits,  whole grains, low-fat dairy products, and lean protein.  Do not eat a lot of foods high in solid fats, added sugars, or salt.  Maintain a Healthy Weight Regular exercise can help you achieve or maintain a healthy weight. You should:  Do at least 150 minutes of exercise each week. The exercise should increase your heart rate and make you sweat (moderate-intensity exercise).  Do strength-training exercises at least twice a week. Watch Your Levels of Cholesterol and Blood Lipids  Have your blood tested for lipids and cholesterol every 5 years starting at 66 years of age. If you are at high risk for heart disease, you should start having your blood tested when you are 66 years old. You may need to have your cholesterol levels checked more often if: ? Your lipid or cholesterol levels are high. ? You are older than 66 years of age. ? You are at high risk for heart disease. What should I know about cancer screening? Many types of cancers can be detected early and may often be prevented. Lung Cancer  You should be screened every year for lung cancer if: ? You are a current smoker who has smoked for at least 30 years. ? You are a former smoker who has quit within the past 15 years.  Talk to your health care provider about your screening options, when you should start screening, and how often you should be screened. Colorectal Cancer  Routine  colorectal cancer screening usually begins at 66 years of age and should be repeated every 5-10 years until you are 66 years old. You may need to be screened more often if early forms of precancerous polyps or small growths are found. Your health care provider may recommend screening at an earlier age if you have risk factors for colon cancer.  Your health care provider may recommend using home test kits to check for hidden blood in the stool.  A small camera at the end of a tube can be used to examine your colon (sigmoidoscopy or colonoscopy). This checks  for the earliest forms of colorectal cancer. Prostate and Testicular Cancer  Depending on your age and overall health, your health care provider may do certain tests to screen for prostate and testicular cancer.  Talk to your health care provider about any symptoms or concerns you have about testicular or prostate cancer. Skin Cancer  Check your skin from head to toe regularly.  Tell your health care provider about any new moles or changes in moles, especially if: ? There is a change in a mole's size, shape, or color. ? You have a mole that is larger than a pencil eraser.  Always use sunscreen. Apply sunscreen liberally and repeat throughout the day.  Protect yourself by wearing long sleeves, pants, a wide-brimmed hat, and sunglasses when outside. What should I know about heart disease, diabetes, and high blood pressure?  If you are 64-33 years of age, have your blood pressure checked every 3-5 years. If you are 58 years of age or older, have your blood pressure checked every year. You should have your blood pressure measured twice-once when you are at a hospital or clinic, and once when you are not at a hospital or clinic. Record the average of the two measurements. To check your blood pressure when you are not at a hospital or clinic, you can use: ? An automated blood pressure machine at a pharmacy. ? A home blood pressure monitor.  Talk to your health care provider about your target blood pressure.  If you are between 44-39 years old, ask your health care provider if you should take aspirin to prevent heart disease.  Have regular diabetes screenings by checking your fasting blood sugar level. ? If you are at a normal weight and have a low risk for diabetes, have this test once every three years after the age of 30. ? If you are overweight and have a high risk for diabetes, consider being tested at a younger age or more often.  A one-time screening for abdominal aortic aneurysm (AAA)  by ultrasound is recommended for men aged 5-75 years who are current or former smokers. What should I know about preventing infection? Hepatitis B If you have a higher risk for hepatitis B, you should be screened for this virus. Talk with your health care provider to find out if you are at risk for hepatitis B infection. Hepatitis C Blood testing is recommended for:  Everyone born from [birth year] through 1963.  Anyone with known risk factors for hepatitis C. Sexually Transmitted Diseases (STDs)  You should be screened each year for STDs including gonorrhea and chlamydia if: ? You are sexually active and are younger than 66 years of age. ? You are older than 66 years of age and your health care provider tells you that you are at risk for this type of infection. ? Your sexual activity has changed since you were last screened and you  are at an increased risk for chlamydia or gonorrhea. Ask your health care provider if you are at risk.  Talk with your health care provider about whether you are at high risk of being infected with HIV. Your health care provider may recommend a prescription medicine to help prevent HIV infection. What else can I do?  Schedule regular health, dental, and eye exams.  Stay current with your vaccines (immunizations).  Do not use any tobacco products, such as cigarettes, chewing tobacco, and e-cigarettes. If you need help quitting, ask your health care provider.  Limit alcohol intake to no more than 2 drinks per day. One drink equals 12 ounces of beer, 5 ounces of wine, or 1 ounces of hard liquor.  Do not use street drugs.  Do not share needles.  Ask your health care provider for help if you need support or information about quitting drugs.  Tell your health care provider if you often feel depressed.  Tell your health care provider if you have ever been abused or do not feel safe at home. This information is not intended to replace advice given to you by your  health care provider. Make sure you discuss any questions you have with your health care provider. Document Released: 11/20/2007 Document Revised: 01/21/2016 Document Reviewed: 02/25/2015 Elsevier Interactive Patient Education  2019 [employer].

## 2018-08-26 NOTE — Progress Notes (Signed)
Subjective:    Patient ID: Sean Mullins, male    DOB: 01/21/1951, 68 y.o.   MRN: [id number]  HPI Here for welcome to medicare wellness exam and an annual physical exam   I have personally reviewed and have noted 1.The patient's medical and social history  2.Their use of alcohol, tobacco or illicit drugs 3.Their current medications and supplements 4.The patient's functional ability including ADL's, fall risks, home                 safety risk and hearing or visual impairment. 5.Diet and physical activities 6.Evidence for depression or mood disorders 7.Care team reviewed  -  Chiropractor occasionally, eye doctor in [place]    Are there smokers in your home (other than you)? No  Risk Factors Exercise:  Exercise is irregular, walking some weights Dietary issues discussed:   Eats a lot of salads, red meat 2/week, no soda, minimal bread  Vitamin and supplement use:  Takes B12, potassium  Opiod use:   none   Cardiac risk factors: advanced age, hypertension, diabetes, hyperlipidemia, CAD, and obesity  Depression Screen  Have you felt down, depressed or hopeless? No  Have you felt little interest or pleasure in doing things?  No  Activities of Daily Living In your present state of health, do you have any difficulty performing the following activities?:  Driving? No Managing money?  No Feeding yourself? No Getting from bed to chair? No Climbing a flight of stairs? No Preparing food and eating?: No Bathing or showering? No Getting dressed: No Getting to/using the toilet? No Moving around from place to place: No In the past year have you fallen or had a near fall?: No   Do you have more than one partner?  No  Hearing Difficulties:  Do you often ask people to speak up or repeat themselves? No Do you experience ringing or noises in your ears? No Do you have difficulty understanding soft or whispered  voices? yes Vision:              Any change in vision: no             Up to date with eye exam:  yes  Memory:  Do you feel that you have a problem with memory? Mild, since TIA  Do you often misplace items? No  Do you feel safe at home?  Yes  Cognitive Testing  Alert, Orientated? Yes  Normal Appearance? Yes  Recall of three objects?  Yes  Can perform simple calculations? Yes  Displays appropriate judgment? Yes  Can read the correct time from a watch face? Yes   Advanced Directives have been discussed with the patient? Yes - in place, discussed updating it     Medications and allergies reviewed with patient and updated if appropriate.  Patient Active Problem List   Diagnosis Date Noted  . BPPV (benign paroxysmal positional vertigo) 01/25/2016  . Essential hypertension 04/29/2015  . TIA (transient ischemic attack) 04/29/2015  . Hyperlipidemia 04/29/2015  . Diabetes ([place]) 04/29/2015  . Obese 05/17/2008  . OBSTRUCTIVE SLEEP APNEA 05/17/2008  . RESTLESS LEG SYNDROME 05/17/2008  . Coronary atherosclerosis 05/17/2008  . DIVERTICULITIS, COLON 05/17/2008  . Osteoarthritis 05/17/2008    Current Outpatient Medications on File Prior to Visit  Medication Sig Dispense Refill  . aspirin EC 81 MG tablet Take 1 tablet (81 mg total) by mouth daily. 30 tablet 0  . blood glucose meter kit and supplies KIT Dispense based on patient  and insurance preference. Use daily and as needed.  NIDDM 1 each 0  . meclizine (ANTIVERT) 25 MG tablet Take 1 tablet (25 mg total) by mouth 3 (three) times daily as needed for dizziness. 90 tablet 1  . potassium gluconate 595 MG TABS tablet Take 1,190 mg by mouth daily.     No current facility-administered medications on file prior to visit.     Past Medical History:  Diagnosis Date  . Anxiety   . Arthritis   . Diverticula of colon   . TIA (transient ischemic attack)    November 2013    Past Surgical History:  Procedure Laterality Date  . DUODENAL  DIVERTICULECTOMY     2003  . KNEE ARTHROPLASTY Left     Social History   Socioeconomic History  . Marital status: Married    Spouse name: Not on file  . Number of children: Not on file  . Years of education: Not on file  . Highest education level: Not on file  Occupational History  . Not on file  Social Needs  . Financial resource strain: Not on file  . Food insecurity:    Worry: Not on file    Inability: Not on file  . Transportation needs:    Medical: Not on file    Non-medical: Not on file  Tobacco Use  . Smoking status: Former Smoker    Packs/day: 1.50    Years: 30.00    Pack years: 45.00    Last attempt to quit: 06/07/1996    Years since quitting: 22.2  . Smokeless tobacco: Never Used  Substance and Sexual Activity  . Alcohol use: Yes    Comment: ocassional   . Drug use: Not on file  . Sexual activity: Not on file  Lifestyle  . Physical activity:    Days per week: Not on file    Minutes per session: Not on file  . Stress: Not on file  Relationships  . Social connections:    Talks on phone: Not on file    Gets together: Not on file    Attends religious service: Not on file    Active member of club or organization: Not on file    Attends meetings of clubs or organizations: Not on file    Relationship status: Not on file  Other Topics Concern  . Not on file  Social History Narrative   No regular exercise.     Family History  Problem Relation Age of Onset  . Transient ischemic attack Father     Review of Systems  Constitutional: Negative for chills and fever.  HENT: Positive for postnasal drip and tinnitus (intermittent). Negative for hearing loss.   Eyes: Negative for visual disturbance.  Respiratory: Positive for cough (occ, allergy related). Negative for shortness of breath and wheezing.   Cardiovascular: Negative for chest pain, palpitations and leg swelling.  Gastrointestinal: Negative for abdominal pain, blood in stool, constipation, diarrhea and  nausea.       No gerd  Genitourinary: Negative for difficulty urinating, dysuria and hematuria.  Musculoskeletal: Positive for arthralgias and back pain.  Skin: Negative for color change and rash.  Neurological: Negative for light-headedness and headaches.  Hematological: Bruises/bleeds easily.  Psychiatric/Behavioral: Negative for dysphoric mood. The patient is not nervous/anxious.        Objective:   Vitals:   08/28/18 0838  BP: (!) 152/78  Pulse: (!) 58  Resp: 16  Temp: 98.2 F (36.8 C)  SpO2: 95%  Filed Weights   08/28/18 0838  Weight: 282 lb 9.6 oz (128.2 kg)   Body mass index is 36.28 kg/m.  BP Readings from Last 3 Encounters:  08/28/18 (!) 152/78  02/24/18 (!) 146/80  08/24/17 134/82    Wt Readings from Last 3 Encounters:  08/28/18 282 lb 9.6 oz (128.2 kg)  02/24/18 277 lb (125.6 kg)  08/24/17 268 lb (121.6 kg)     Physical Exam Constitutional: He appears well-developed and well-nourished. No distress.  HENT:  Head: Normocephalic and atraumatic.  Right Ear: External ear normal.  Left Ear: External ear normal.  Mouth/Throat: Oropharynx is clear and moist.  Normal ear canals and TM b/l  Eyes: Conjunctivae and EOM are normal.  Neck: Neck supple. No tracheal deviation present. No thyromegaly present. No carotid bruit  Cardiovascular: Normal rate, regular rhythm, normal heart sounds and intact distal pulses.  No murmur heard. Pulmonary/Chest: Effort normal and breath sounds normal. No respiratory distress. He has no wheezes. He has no rales.  Abdominal: Soft. He exhibits no distension. There is no tenderness.  Genitourinary: deferred  Musculoskeletal: He exhibits no edema.  Lymphadenopathy:   He has no cervical adenopathy.  Skin: Skin is warm and dry. He is not diaphoretic.  Psychiatric: He has a normal mood and affect. His behavior is normal.    Diabetic Foot Exam - Simple   Simple Foot Form Diabetic Foot exam was performed with the following  findings:  Yes 08/28/2018  9:23 AM  Visual Inspection No deformities, no ulcerations, no other skin breakdown bilaterally:  Yes Sensation Testing Intact to touch and monofilament testing bilaterally:  Yes Pulse Check Posterior Tibialis and Dorsalis pulse intact bilaterally:  Yes Comments         Assessment & Plan:   Wellness Exam: Immunizations  Flu, tdap up to date,  prevnar today, shingrix discussed Colonoscopy    Overdue -- agreed to have one, discussed cologuard Eye exam   Up to date  Hearing loss   minimal-he is not concerned about it does not feel he needs further evaluation Memory concerns/difficulties  none Independent of ADLs   Fully independent Stressed the importance of regular exercise  EKG done today: Sinus bradycardia at 56 bpm, no acute changes, normal EKG.  No changes compared to EKG from 01/2016   Patient received copy of preventative screening tests/immunizations recommended for the next 5-10 years.     Physical exam: Screening blood work  ordered Immunizations  Flu, tdap up to date,  prevnar today, shingrix discussed Colonoscopy  Overdue -- agreed to have one, discussed cologuard Eye exams   Up to date  EKG   Done 01/2016, repeat today for wellness visit, see above Exercise  - Exercise is irregular Weight   Advised weight loss Skin  No concerns, except bruising more easily Substance abuse  none  See Problem List for Assessment and Plan of chronic medical problems.    Follow-up in 6 months

## 2018-08-28 ENCOUNTER — Encounter: Payer: Self-pay | Admitting: Internal Medicine

## 2018-08-28 ENCOUNTER — Ambulatory Visit (INDEPENDENT_AMBULATORY_CARE_PROVIDER_SITE_OTHER): Payer: PPO | Admitting: Internal Medicine

## 2018-08-28 ENCOUNTER — Other Ambulatory Visit: Payer: Self-pay

## 2018-08-28 ENCOUNTER — Other Ambulatory Visit (INDEPENDENT_AMBULATORY_CARE_PROVIDER_SITE_OTHER): Payer: PPO

## 2018-08-28 VITALS — BP 152/78 | HR 58 | Temp 98.2°F | Resp 16 | Ht 74.0 in | Wt 282.6 lb

## 2018-08-28 DIAGNOSIS — Z125 Encounter for screening for malignant neoplasm of prostate: Secondary | ICD-10-CM | POA: Diagnosis not present

## 2018-08-28 DIAGNOSIS — Z6836 Body mass index (BMI) 36.0-36.9, adult: Secondary | ICD-10-CM

## 2018-08-28 DIAGNOSIS — G459 Transient cerebral ischemic attack, unspecified: Secondary | ICD-10-CM | POA: Diagnosis not present

## 2018-08-28 DIAGNOSIS — Z1211 Encounter for screening for malignant neoplasm of colon: Secondary | ICD-10-CM

## 2018-08-28 DIAGNOSIS — I2510 Atherosclerotic heart disease of native coronary artery without angina pectoris: Secondary | ICD-10-CM

## 2018-08-28 DIAGNOSIS — I10 Essential (primary) hypertension: Secondary | ICD-10-CM

## 2018-08-28 DIAGNOSIS — M159 Polyosteoarthritis, unspecified: Secondary | ICD-10-CM

## 2018-08-28 DIAGNOSIS — E119 Type 2 diabetes mellitus without complications: Secondary | ICD-10-CM | POA: Diagnosis not present

## 2018-08-28 DIAGNOSIS — M150 Primary generalized (osteo)arthritis: Secondary | ICD-10-CM | POA: Diagnosis not present

## 2018-08-28 DIAGNOSIS — Z23 Encounter for immunization: Secondary | ICD-10-CM

## 2018-08-28 DIAGNOSIS — E7849 Other hyperlipidemia: Secondary | ICD-10-CM

## 2018-08-28 DIAGNOSIS — Z0000 Encounter for general adult medical examination without abnormal findings: Secondary | ICD-10-CM

## 2018-08-28 DIAGNOSIS — E66812 Obesity, class 2: Secondary | ICD-10-CM

## 2018-08-28 LAB — TSH: TSH: 1.18 u[IU]/mL (ref 0.35–4.50)

## 2018-08-28 LAB — CBC WITH DIFFERENTIAL/PLATELET
Basophils Absolute: 0 10*3/uL (ref 0.0–0.1)
Basophils Relative: 0.5 % (ref 0.0–3.0)
EOS PCT: 2.3 % (ref 0.0–5.0)
Eosinophils Absolute: 0.1 10*3/uL (ref 0.0–0.7)
HCT: 44.8 % (ref 39.0–52.0)
Hemoglobin: 15.9 g/dL (ref 13.0–17.0)
Lymphocytes Relative: 39.3 % (ref 12.0–46.0)
Lymphs Abs: 2 10*3/uL (ref 0.7–4.0)
MCHC: 35.5 g/dL (ref 30.0–36.0)
MCV: 92.7 fl (ref 78.0–100.0)
Monocytes Absolute: 0.5 10*3/uL (ref 0.1–1.0)
Monocytes Relative: 9.5 % (ref 3.0–12.0)
Neutro Abs: 2.5 10*3/uL (ref 1.4–7.7)
Neutrophils Relative %: 48.4 % (ref 43.0–77.0)
Platelets: 196 10*3/uL (ref 150.0–400.0)
RBC: 4.83 Mil/uL (ref 4.22–5.81)
RDW: 12.9 % (ref 11.5–15.5)
WBC: 5.2 10*3/uL (ref 4.0–10.5)

## 2018-08-28 LAB — LIPID PANEL
Cholesterol: 120 mg/dL (ref 0–200)
HDL: 37.9 mg/dL — ABNORMAL LOW (ref >39.00)
LDL Cholesterol: 58 mg/dL (ref 0–99)
NonHDL: 81.68
Total CHOL/HDL Ratio: 3
Triglycerides: 118 mg/dL (ref 0.0–149.0)
VLDL: 23.6 mg/dL (ref 0.0–40.0)

## 2018-08-28 LAB — COMPREHENSIVE METABOLIC PANEL
ALT: 54 U/L — ABNORMAL HIGH (ref 0–53)
AST: 30 U/L (ref 0–37)
Albumin: 4.5 g/dL (ref 3.5–5.2)
Alkaline Phosphatase: 58 U/L (ref 39–117)
BUN: 10 mg/dL (ref 6–23)
CO2: 27 mEq/L (ref 19–32)
Calcium: 9.7 mg/dL (ref 8.4–10.5)
Chloride: 104 mEq/L (ref 96–112)
Creatinine, Ser: 0.78 mg/dL (ref 0.40–1.50)
GFR: 99.73 mL/min (ref >60.00)
Glucose, Bld: 119 mg/dL — ABNORMAL HIGH (ref 70–99)
POTASSIUM: 4.7 meq/L (ref 3.5–5.1)
Sodium: 139 mEq/L (ref 135–145)
Total Bilirubin: 1.1 mg/dL (ref 0.2–1.2)
Total Protein: 7.1 g/dL (ref 6.0–8.3)

## 2018-08-28 LAB — HEMOGLOBIN A1C: Hgb A1c MFr Bld: 5.9 % (ref 4.6–6.5)

## 2018-08-28 LAB — PSA, MEDICARE: PSA: 1.12 ng/ml (ref 0.10–4.00)

## 2018-08-28 MED ORDER — LISINOPRIL 20 MG PO TABS: 20.0000 mg | ORAL_TABLET | Freq: Every day | ORAL | 3 refills | Status: DC

## 2018-08-28 MED ORDER — CELECOXIB 200 MG PO CAPS: 200.0000 mg | ORAL_CAPSULE | Freq: Two times a day (BID) | ORAL | 1 refills | Status: DC

## 2018-08-28 MED ORDER — ROSUVASTATIN CALCIUM 10 MG PO TABS: 10.0000 mg | ORAL_TABLET | Freq: Every day | ORAL | 1 refills | Status: DC

## 2018-08-28 NOTE — Assessment & Plan Note (Signed)
History of TIA On aspirin, Crestor Blood pressure medication increased since not well controlled

## 2018-08-28 NOTE — Assessment & Plan Note (Signed)
Diet controlled Check A1c He has gained weight over the past year and has not been exercising as regularly Stressed the importance of a diabetic diet, regular exercise Advised weight loss Follow-up in 6 months

## 2018-08-28 NOTE — Assessment & Plan Note (Signed)
He denies chest pain, palpitations and shortness of breath Does not follow with cardiology Taking aspirin 81 mg daily, Crestor Continue Blood work today bleeding CBC, lipid panel, CMP, TSH

## 2018-08-28 NOTE — Assessment & Plan Note (Signed)
Osteoarthritis multiple joints Taking Librax daily, which is effective We will continue CMP

## 2018-08-28 NOTE — Assessment & Plan Note (Signed)
Blood pressure elevated on last 2 occasions including today Increase lisinopril to 20 mg daily CMP Discussed the importance of low-sodium diet, increasing exercise and working on weight loss

## 2018-08-28 NOTE — Assessment & Plan Note (Signed)
With diabetes, coronary artery disease, hypertension and hyperlipidemia, obstructive sleep apnea Stressed the importance of regular exercise, decrease portions and healthy diet He has gained significant weight over the past year and stressed the importance of losing weight Follow-up in 6 months

## 2018-08-28 NOTE — Assessment & Plan Note (Signed)
Check lipid panel, CMP, TSH Continue daily statin Regular exercise and healthy diet encouraged  

## 2018-12-06 ENCOUNTER — Encounter: Payer: Self-pay | Admitting: Internal Medicine

## 2018-12-20 ENCOUNTER — Encounter: Payer: Self-pay | Admitting: Internal Medicine

## 2018-12-22 ENCOUNTER — Other Ambulatory Visit: Payer: Self-pay | Admitting: Internal Medicine

## 2019-02-27 NOTE — Patient Instructions (Addendum)
  Tests ordered today. Your results will be released to [place] (or called to you) after review.  If any changes need to be made, you will be notified at that same time.   Flu immunization administered today.    Medications reviewed and updated.  Changes include :   Start bystolic 10 mg daily.  Your prescription(s) have been submitted to your pharmacy. Please take as directed and contact our office if you believe you are having problem(s) with the medication(s).    Please followup in 6 months

## 2019-02-27 NOTE — Progress Notes (Signed)
Subjective:    Patient ID: Sean Mullins, male    DOB: [birth year], 68 y.o.   MRN: [id number]  HPI The patient is here for follow up.  He is not exercising regularly.  He has gained weight.  CAD, h/o TIA, Hypertension: He is taking his medication daily. He is compliant with a low sodium diet.  He will get some shortness of breath with overexertion.  He denies chest pain, palpitations, edema and regular headaches.  He does monitor his blood pressure at home and it has been elevated-typically in the upper 140s.Marland Kitchen    Hyperlipidemia: He is taking his medication daily. He is compliant with a low fat/cholesterol diet. He denies myalgias.   Diabetes: He is controlling his sugars with lifestyle. He is compliant with a diabetic diet.  He is up-to-date with an ophthalmology examination.   OA:  He takes celebrex daily.  The Celebrex does help with this pain.  He does have easy bruising.  He denies GERD symptoms.    Medications and allergies reviewed with patient and updated if appropriate.  Patient Active Problem List   Diagnosis Date Noted  . BPPV (benign paroxysmal positional vertigo) 01/25/2016  . Essential hypertension 04/29/2015  . TIA (transient ischemic attack) 04/29/2015  . Hyperlipidemia 04/29/2015  . Diabetes ([place]) 04/29/2015  . Obese 05/17/2008  . OBSTRUCTIVE SLEEP APNEA 05/17/2008  . RESTLESS LEG SYNDROME 05/17/2008  . Coronary atherosclerosis 05/17/2008  . DIVERTICULITIS, COLON 05/17/2008  . Osteoarthritis 05/17/2008    Current Outpatient Medications on File Prior to Visit  Medication Sig Dispense Refill  . aspirin EC 81 MG tablet Take 1 tablet (81 mg total) by mouth daily. 30 tablet 0  . blood glucose meter kit and supplies KIT Dispense based on patient and insurance preference. Use daily and as needed.  NIDDM 1 each 0  . celecoxib (CELEBREX) 200 MG capsule Take 1 capsule (200 mg total) by mouth 2 (two) times daily. Follow=up appt due in Sept must see provider for  future refills 180 capsule 0  . lisinopril (PRINIVIL,ZESTRIL) 20 MG tablet Take 1 tablet (20 mg total) by mouth daily. 90 tablet 3  . meclizine (ANTIVERT) 25 MG tablet Take 1 tablet (25 mg total) by mouth 3 (three) times daily as needed for dizziness. 90 tablet 1  . potassium gluconate 595 MG TABS tablet Take 1,190 mg by mouth daily.    . rosuvastatin (CRESTOR) 10 MG tablet Take 1 tablet (10 mg total) by mouth daily. 90 tablet 1   No current facility-administered medications on file prior to visit.     Past Medical History:  Diagnosis Date  . Anxiety   . Arthritis   . Diverticula of colon   . TIA (transient ischemic attack)    November 2013    Past Surgical History:  Procedure Laterality Date  . DUODENAL DIVERTICULECTOMY     2003  . KNEE ARTHROPLASTY Left     Social History   Socioeconomic History  . Marital status: Married    Spouse name: Not on file  . Number of children: Not on file  . Years of education: Not on file  . Highest education level: Not on file  Occupational History  . Not on file  Social Needs  . Financial resource strain: Not on file  . Food insecurity    Worry: Not on file    Inability: Not on file  . Transportation needs    Medical: Not on file    Non-medical: Not  on file  Tobacco Use  . Smoking status: Former Smoker    Packs/day: 1.50    Years: 30.00    Pack years: 45.00    Quit date: 06/07/1996    Years since quitting: 22.7  . Smokeless tobacco: Never Used  Substance and Sexual Activity  . Alcohol use: Yes    Comment: ocassional   . Drug use: Not on file  . Sexual activity: Not on file  Lifestyle  . Physical activity    Days per week: Not on file    Minutes per session: Not on file  . Stress: Not on file  Relationships  . Social Herbalist on phone: Not on file    Gets together: Not on file    Attends religious service: Not on file    Active member of club or organization: Not on file    Attends meetings of clubs or  organizations: Not on file    Relationship status: Not on file  Other Topics Concern  . Not on file  Social History Narrative   No regular exercise.     Family History  Problem Relation Age of Onset  . Transient ischemic attack Father     Review of Systems  Constitutional: Negative for chills and fever.  Respiratory: Positive for shortness of breath (with overexertion). Negative for cough and wheezing.   Cardiovascular: Negative for chest pain, palpitations and leg swelling.  Neurological: Negative for light-headedness and headaches.       Objective:   Vitals:   02/28/19 0822  BP: (!) 154/92  Pulse: 78  Resp: 16  Temp: 98.1 F (36.7 C)  SpO2: 97%   BP Readings from Last 3 Encounters:  02/28/19 (!) 154/92  08/28/18 (!) 152/78  02/24/18 (!) 146/80   Wt Readings from Last 3 Encounters:  02/28/19 285 lb 12.8 oz (129.6 kg)  08/28/18 282 lb 9.6 oz (128.2 kg)  02/24/18 277 lb (125.6 kg)   Body mass index is 36.69 kg/m.   Physical Exam    Constitutional: Appears well-developed and well-nourished. No distress.  HENT:  Head: Normocephalic and atraumatic.  Neck: Neck supple. No tracheal deviation present. No thyromegaly present.  No cervical lymphadenopathy Cardiovascular: Normal rate, regular rhythm and normal heart sounds.  No murmur heard. No carotid bruit .  No edema Pulmonary/Chest: Effort normal and breath sounds normal. No respiratory distress. No has no wheezes. No rales.  Skin: Skin is warm and dry. Not diaphoretic.  Psychiatric: Normal mood and affect. Behavior is normal.      Assessment & Plan:    See Problem List for Assessment and Plan of chronic medical problems.

## 2019-02-28 ENCOUNTER — Other Ambulatory Visit (INDEPENDENT_AMBULATORY_CARE_PROVIDER_SITE_OTHER): Payer: PPO

## 2019-02-28 ENCOUNTER — Encounter: Payer: Self-pay | Admitting: Internal Medicine

## 2019-02-28 ENCOUNTER — Ambulatory Visit (INDEPENDENT_AMBULATORY_CARE_PROVIDER_SITE_OTHER): Payer: PPO | Admitting: Internal Medicine

## 2019-02-28 ENCOUNTER — Other Ambulatory Visit: Payer: Self-pay

## 2019-02-28 VITALS — BP 154/92 | HR 78 | Temp 98.1°F | Resp 16 | Ht 74.0 in | Wt 285.8 lb

## 2019-02-28 DIAGNOSIS — Z8673 Personal history of transient ischemic attack (TIA), and cerebral infarction without residual deficits: Secondary | ICD-10-CM | POA: Diagnosis not present

## 2019-02-28 DIAGNOSIS — I2510 Atherosclerotic heart disease of native coronary artery without angina pectoris: Secondary | ICD-10-CM

## 2019-02-28 DIAGNOSIS — E119 Type 2 diabetes mellitus without complications: Secondary | ICD-10-CM

## 2019-02-28 DIAGNOSIS — E7849 Other hyperlipidemia: Secondary | ICD-10-CM | POA: Diagnosis not present

## 2019-02-28 DIAGNOSIS — M159 Polyosteoarthritis, unspecified: Secondary | ICD-10-CM

## 2019-02-28 DIAGNOSIS — I10 Essential (primary) hypertension: Secondary | ICD-10-CM

## 2019-02-28 DIAGNOSIS — M150 Primary generalized (osteo)arthritis: Secondary | ICD-10-CM

## 2019-02-28 DIAGNOSIS — Z23 Encounter for immunization: Secondary | ICD-10-CM

## 2019-02-28 DIAGNOSIS — K5792 Diverticulitis of intestine, part unspecified, without perforation or abscess without bleeding: Secondary | ICD-10-CM | POA: Insufficient documentation

## 2019-02-28 LAB — LIPID PANEL
Cholesterol: 113 mg/dL (ref 0–200)
HDL: 39.1 mg/dL (ref >39.00)
LDL Cholesterol: 53 mg/dL (ref 0–99)
NonHDL: 73.4
Total CHOL/HDL Ratio: 3
Triglycerides: 104 mg/dL (ref 0.0–149.0)
VLDL: 20.8 mg/dL (ref 0.0–40.0)

## 2019-02-28 LAB — COMPREHENSIVE METABOLIC PANEL
ALT: 58 U/L — ABNORMAL HIGH (ref 0–53)
AST: 39 U/L — ABNORMAL HIGH (ref 0–37)
Albumin: 4.5 g/dL (ref 3.5–5.2)
Alkaline Phosphatase: 54 U/L (ref 39–117)
BUN: 14 mg/dL (ref 6–23)
CO2: 27 mEq/L (ref 19–32)
Calcium: 10.2 mg/dL (ref 8.4–10.5)
Chloride: 100 mEq/L (ref 96–112)
Creatinine, Ser: 0.82 mg/dL (ref 0.40–1.50)
GFR: 93.99 mL/min (ref >60.00)
Glucose, Bld: 121 mg/dL — ABNORMAL HIGH (ref 70–99)
Potassium: 5.3 mEq/L — ABNORMAL HIGH (ref 3.5–5.1)
Sodium: 137 mEq/L (ref 135–145)
Total Bilirubin: 1.1 mg/dL (ref 0.2–1.2)
Total Protein: 7.4 g/dL (ref 6.0–8.3)

## 2019-02-28 LAB — HEMOGLOBIN A1C: Hgb A1c MFr Bld: 6.1 % (ref 4.6–6.5)

## 2019-02-28 MED ORDER — NEBIVOLOL HCL 10 MG PO TABS: 10.0000 mg | ORAL_TABLET | Freq: Every day | ORAL | 5 refills | Status: DC

## 2019-02-28 NOTE — Assessment & Plan Note (Signed)
History of TIA No concerning symptoms Continue aspirin 81 mg, Crestor 10 mg We will get blood pressure better controlled Stressed regular exercise and weight loss

## 2019-02-28 NOTE — Assessment & Plan Note (Signed)
Blood pressure not ideally controlled Continue lisinopril 20 mg daily Start Bystolic 10 mg daily He will monitor his blood pressure at home and let me know if is not consistently less than 140 Encouraged weight loss and regular exercise CMP

## 2019-02-28 NOTE — Assessment & Plan Note (Signed)
Check lipid panel  Continue daily statin Regular exercise and healthy diet encouraged  

## 2019-02-28 NOTE — Assessment & Plan Note (Signed)
taking celebrex daily Pain controlled

## 2019-02-28 NOTE — Assessment & Plan Note (Addendum)
Had a catheterization at one point and had minimal-no disease-this record is not in his chart Does have some shortness of breath with overexertion, but he is overweight and not exercising regularly Encourage weight loss and regular exercise If shortness of breath persists or worsens may need to see cardiology Continue aspirin 81 mg daily, Crestor 10 mg daily CMP, lipid panel

## 2019-03-02 ENCOUNTER — Other Ambulatory Visit: Payer: Self-pay

## 2019-03-02 DIAGNOSIS — E875 Hyperkalemia: Secondary | ICD-10-CM

## 2019-05-09 ENCOUNTER — Ambulatory Visit: Payer: Self-pay | Admitting: *Deleted

## 2019-05-09 NOTE — Telephone Encounter (Signed)
Patient reporting his B/P 169/100 yesterday. No sxs with yesterday's reading. Today, B/P 137/80.  Stated he was prescribed Bystolic 10 mg tabs, one daily in October and after taking it for a few days he stopped taking the Bystolic due to making him feel "wacky". He began taking the Bystolic again last night and did not experience that same feeling afterwards. Denies all symptoms today and is feeling well. Care advice including have labs drawn as ordered, continue to take the Bystolic at night, add in daily walking and eliminate any sodium intake, increase daily water intake. Reviewed urgent symptoms should they occur seek immediate evaluation at the ED. Routing to PCP for any further advice.   Reason for Disposition . [id number] Systolic BP  >= [id number] OR Diastolic >= 80 AND [id number] not taking BP medications  Answer Assessment - Initial Assessment Questions 1. BLOOD PRESSURE: "What is the blood pressure?" "Did you take at least two measurements 5 minutes apart?"     137/80  2. ONSET: "When did you take your blood pressure?"     daily 3. HOW: "How did you obtain the blood pressure?" (e.g., visiting nurse, automatic home BP monitor)     Home cuff and monitor 4. HISTORY: "Do you have a history of high blood pressure?"     yes 5. MEDICATIONS: "Are you taking any medications for blood pressure?" "Have you missed any doses recently?"     He has stopped taking Bystolic after a few days 6. OTHER SYMPTOMS: "Do you have any symptoms?" (e.g., headache, chest pain, blurred vision, difficulty breathing, weakness)     None of these. 7. PREGNANCY: "Is there any chance you are pregnant?" "When was your last menstrual period?"     Na  Protocols used: HIGH BLOOD PRESSURE-A-AH

## 2019-05-09 NOTE — Telephone Encounter (Signed)
Agree with advice below.  Please have him call us in a few days with blood pressure readings.

## 2019-05-10 NOTE — Telephone Encounter (Signed)
Called and left message for patient to keep daily log and return call to clinic with readings in a few days.

## 2019-05-14 ENCOUNTER — Encounter (HOSPITAL_COMMUNITY): Payer: Self-pay

## 2019-05-14 ENCOUNTER — Other Ambulatory Visit: Payer: Self-pay

## 2019-05-14 ENCOUNTER — Observation Stay (HOSPITAL_COMMUNITY)
Admission: EM | Admit: 2019-05-14 | Discharge: 2019-05-15 | Disposition: A | Discharge status: Home / Self Care | Payer: PPO | Attending: Internal Medicine | Admitting: Internal Medicine

## 2019-05-14 ENCOUNTER — Emergency Department (HOSPITAL_COMMUNITY): Payer: PPO

## 2019-05-14 DIAGNOSIS — Z79899 Other long term (current) drug therapy: Secondary | ICD-10-CM | POA: Diagnosis not present

## 2019-05-14 DIAGNOSIS — I10 Essential (primary) hypertension: Secondary | ICD-10-CM | POA: Insufficient documentation

## 2019-05-14 DIAGNOSIS — E785 Hyperlipidemia, unspecified: Secondary | ICD-10-CM | POA: Insufficient documentation

## 2019-05-14 DIAGNOSIS — R7303 Prediabetes: Secondary | ICD-10-CM | POA: Diagnosis not present

## 2019-05-14 DIAGNOSIS — Z8719 Personal history of other diseases of the digestive system: Secondary | ICD-10-CM | POA: Diagnosis not present

## 2019-05-14 DIAGNOSIS — Z9049 Acquired absence of other specified parts of digestive tract: Secondary | ICD-10-CM | POA: Diagnosis not present

## 2019-05-14 DIAGNOSIS — J9811 Atelectasis: Secondary | ICD-10-CM | POA: Diagnosis not present

## 2019-05-14 DIAGNOSIS — G43909 Migraine, unspecified, not intractable, without status migrainosus: Secondary | ICD-10-CM | POA: Diagnosis not present

## 2019-05-14 DIAGNOSIS — Z7982 Long term (current) use of aspirin: Secondary | ICD-10-CM | POA: Diagnosis not present

## 2019-05-14 DIAGNOSIS — R55 Syncope and collapse: Secondary | ICD-10-CM | POA: Diagnosis not present

## 2019-05-14 DIAGNOSIS — R42 Dizziness and giddiness: Secondary | ICD-10-CM | POA: Diagnosis not present

## 2019-05-14 DIAGNOSIS — Z87891 Personal history of nicotine dependence: Secondary | ICD-10-CM | POA: Insufficient documentation

## 2019-05-14 DIAGNOSIS — G459 Transient cerebral ischemic attack, unspecified: Secondary | ICD-10-CM | POA: Diagnosis present

## 2019-05-14 DIAGNOSIS — R519 Headache, unspecified: Secondary | ICD-10-CM | POA: Diagnosis not present

## 2019-05-14 DIAGNOSIS — R0789 Other chest pain: Secondary | ICD-10-CM | POA: Diagnosis not present

## 2019-05-14 DIAGNOSIS — Z20828 Contact with and (suspected) exposure to other viral communicable diseases: Secondary | ICD-10-CM | POA: Insufficient documentation

## 2019-05-14 DIAGNOSIS — E669 Obesity, unspecified: Secondary | ICD-10-CM | POA: Insufficient documentation

## 2019-05-14 DIAGNOSIS — Z8673 Personal history of transient ischemic attack (TIA), and cerebral infarction without residual deficits: Secondary | ICD-10-CM | POA: Insufficient documentation

## 2019-05-14 DIAGNOSIS — Z6830 Body mass index (BMI) 30.0-30.9, adult: Secondary | ICD-10-CM | POA: Insufficient documentation

## 2019-05-14 LAB — COMPREHENSIVE METABOLIC PANEL
ALT: 67 U/L — ABNORMAL HIGH (ref 0–44)
AST: 52 U/L — ABNORMAL HIGH (ref 15–41)
Albumin: 4.4 g/dL (ref 3.5–5.0)
Alkaline Phosphatase: 59 U/L (ref 38–126)
Anion gap: 13 (ref 5–15)
BUN: 14 mg/dL (ref 8–23)
CO2: 23 mmol/L (ref 22–32)
Calcium: 9.3 mg/dL (ref 8.9–10.3)
Chloride: 103 mmol/L (ref 98–111)
Creatinine, Ser: 0.75 mg/dL (ref 0.61–1.24)
GFR calc Af Amer: >60 mL/min (ref >60)
GFR calc non Af Amer: >60 mL/min (ref >60)
Glucose, Bld: 99 mg/dL (ref 70–99)
Potassium: 4.1 mmol/L (ref 3.5–5.1)
Sodium: 139 mmol/L (ref 135–145)
Total Bilirubin: 1.5 mg/dL — ABNORMAL HIGH (ref 0.3–1.2)
Total Protein: 7.7 g/dL (ref 6.5–8.1)

## 2019-05-14 LAB — TROPONIN I (HIGH SENSITIVITY)
Troponin I (High Sensitivity): 3 ng/L (ref <18)
Troponin I (High Sensitivity): 3 ng/L (ref <18)

## 2019-05-14 LAB — CBC WITH DIFFERENTIAL/PLATELET
Abs Immature Granulocytes: 0.01 10*3/uL (ref 0.00–0.07)
Basophils Absolute: 0 10*3/uL (ref 0.0–0.1)
Basophils Relative: 0 %
Eosinophils Absolute: 0.2 10*3/uL (ref 0.0–0.5)
Eosinophils Relative: 2 %
HCT: 43.6 % (ref 39.0–52.0)
Hemoglobin: 15.6 g/dL (ref 13.0–17.0)
Immature Granulocytes: 0 %
Lymphocytes Relative: 51 %
Lymphs Abs: 3.9 10*3/uL (ref 0.7–4.0)
MCH: 33.1 pg (ref 26.0–34.0)
MCHC: 35.8 g/dL (ref 30.0–36.0)
MCV: 92.4 fL (ref 80.0–100.0)
Monocytes Absolute: 0.9 10*3/uL (ref 0.1–1.0)
Monocytes Relative: 11 %
Neutro Abs: 2.8 10*3/uL (ref 1.7–7.7)
Neutrophils Relative %: 36 %
Platelets: 210 10*3/uL (ref 150–400)
RBC: 4.72 MIL/uL (ref 4.22–5.81)
RDW: 12.8 % (ref 11.5–15.5)
WBC: 7.8 10*3/uL (ref 4.0–10.5)
nRBC: 0 % (ref 0.0–0.2)

## 2019-05-14 LAB — PROTIME-INR
INR: 0.9 (ref 0.8–1.2)
Prothrombin Time: 12.3 seconds (ref 11.4–15.2)

## 2019-05-14 LAB — SARS CORONAVIRUS 2 (TAT 6-24 HRS): SARS Coronavirus 2: NEGATIVE

## 2019-05-14 LAB — CBG MONITORING, ED: Glucose-Capillary: 94 mg/dL (ref 70–99)

## 2019-05-14 MED ORDER — ACETAMINOPHEN 325 MG PO TABS
650.0000 mg | ORAL_TABLET | ORAL | Status: DC | PRN
  Administered 2019-05-14: 650 mg via ORAL
  Filled 2019-05-14: qty 2

## 2019-05-14 MED ORDER — ACETAMINOPHEN 650 MG RE SUPP: 650.0000 mg | RECTAL | Status: DC | PRN

## 2019-05-14 MED ORDER — ACETAMINOPHEN 160 MG/5ML PO SOLN: 650.0000 mg | ORAL | Status: DC | PRN

## 2019-05-14 NOTE — ED Notes (Signed)
Contacted [facility] for transport 

## 2019-05-14 NOTE — ED Notes (Addendum)
Patient ambulated to RR and back. Given PO fluids and food. Wife at bedside.

## 2019-05-14 NOTE — Consult Note (Signed)
[facility] Consult Services   Date of Service:   05/14/2019 11:35:38  Impression:     .  I63.9 - Cerebrovascular accident (CVA), unspecified mechanism ([place])  Comments/Sign-Out: Patients clinical features can be compatible with diagnosis of acute ischemic stroke however other vascular and non-vascular conditions that present with an acute neurological deficit simulating acute ischemic stroke is possible. Chief differential include: Presyncopal event  Metrics: Last Known Well: 05/14/2019 10:15:38 [facility] Notification Time: 05/14/2019 11:35:38 Arrival Time: 05/14/2019 11:07:38 Stamp Time: 05/14/2019 11:35:38 Time First Login Attempt: 05/14/2019 11:38:17 Video Start Time: 05/14/2019 11:38:17  Symptoms: spots in visual field NIHSS Start Assessment Time: 05/14/2019 11:45:17 Patient is not a candidate for Alteplase/Activase. Patient was not deemed candidate for Alteplase/Activase thrombolytics because of Resolved symptoms (no residual disabling symptoms). Video End Time: 05/14/2019 12:03:26  CT head showed no acute hemorrhage or acute core infarct.  Clinical Presentation is not Suggestive of Large Vessel Occlusive Disease  ED Physician notified of diagnostic impression and management plan on 05/14/2019 12:04:29  Our recommendations are outlined below.  Recommendations:     .  Activate Stroke Protocol Admission/Order Set     .  Stroke/Telemetry Floor     .  Neuro Checks     .  Bedside Swallow Eval     .  DVT Prophylaxis     .  IV Fluids, Normal Saline     .  Head of Bed 30 Degrees     .  Euglycemia and Avoid Hyperthermia (PRN Acetaminophen)     .  Antiplatelet Therapy Recommended  Routine Consultation with [facility] for Follow up Care  Sign Out:     .  Discussed with Emergency Department Provider    ------------------------------------------------------------------------------  History of Present Illness: Patient is a  68 year old Male.  Patient was brought by private transportation with symptoms of spots in visual field  Patient seen in ED Information obtained from patient and nursing staff h/o TIA, HTN Chronology: pt was driving down the road when he started feeling dizzy and was seeing spots. He describes he started feeling hot, had palpiations, and saw spots in front of his eyes " every time hear beat", he was nauseous and had a heavy feeling in chest In the ED he had no focal neurological deficts His vision has improved but he stills feels "funny". He has a mild HA Medications:lisopil, bistolic [id number] Blood glucose:94  Last seen normal was within 4.5 hours. There is no history of hemorrhagic complications or intracranial hemorrhage. There is no history of Recent Anticoagulants. There is no history of recent major surgery. There is no history of recent stroke.  Past Medical History:     . Hypertension      Examination: BP(191/103), Pulse(69), Blood Glucose(94) 1A: Level of Consciousness - Alert; keenly responsive + 0 1B: Ask Month and Age - Both Questions Right + 0 1C: Blink Eyes & Squeeze Hands - Performs Both Tasks + 0 2: Test Horizontal Extraocular Movements - Normal + 0 3: Test Visual Fields - No Visual Loss + 0 4: Test Facial Palsy (Use Grimace if Obtunded) - Normal symmetry + 0 5A: Test Left Arm Motor Drift - No Drift for 10 Seconds + 0 5B: Test Right Arm Motor Drift - No Drift for 10 Seconds + 0 6A: Test Left Leg Motor Drift - No Drift for 5 Seconds + 0 6B: Test Right Leg Motor Drift - No Drift for 5 Seconds + 0 7: Test Limb Ataxia (FNF/Heel-Shin) -  No Ataxia + 0 8: Test Sensation - Normal; No sensory loss + 0 9: Test Language/Aphasia - Normal; No aphasia + 0 10: Test Dysarthria - Normal + 0 11: Test Extinction/Inattention - No abnormality + 0  NIHSS Score: 0  Pre-Morbid Modified Ranking Scale: 0 Points = No symptoms at all   Patient/Family was informed the Neurology  Consult would happen via TeleHealth consult by way of interactive audio and video telecommunications and consented to receiving care in this manner.   Due to the immediate potential for life-threatening deterioration due to underlying acute neurologic illness, I spent 35 minutes providing critical care. This time includes time for face to face visit via telemedicine, review of medical records, imaging studies and discussion of findings with providers, the patient and/or family.   Dr Suzi Roots Alben Jepsen   [facility] [phone]   Case [id number]

## 2019-05-14 NOTE — ED Notes (Signed)
TeleNe[facility]/ Stroke cart is not working, number called on side of the cart and connected to teleneuro RN. Provider at bedside.

## 2019-05-14 NOTE — ED Provider Notes (Signed)
[facility] DEPT Provider Note   CSN: [id number] Arrival date & time: 05/14/19  1107     History   Chief Complaint Chief Complaint  Patient presents with  . Dizziness    HPI Sean Mullins is a 68 y.o. male.     68 y/o male with a PMH of Anxiety, TIA, CAD, DM presents to the ED with a chief complaint of dizziness. Patient reports he was driving from [place] when he began experiencing dizziness, states that the closer he got to [place] he had increased nausea, dizziness, felt flushed, along with tightness along his chest.  He does have a prior history of vertigo, reports the dizziness felt different, he also did take his meclizine this morning.  He was driving to his doctor's office as they were doing blood work today.  He also endorses a headache, this is throughout his whole head feeling of a throbbing sensation.  He reports there was no weakness, dysarthria, or tingling in his extremities, however he reports feeling somewhat disoriented upon arrival.  He denies any fevers, does have a prior history of a cardiac cath, no cough, prior history of blood clots.  The history is provided by the patient and medical records.    Past Medical History:  Diagnosis Date  . Anxiety   . Arthritis   . Diverticula of colon   . TIA (transient ischemic attack)    November 2013    Patient Active Problem List   Diagnosis Date Noted  . Diverticulitis 02/28/2019  . BPPV (benign paroxysmal positional vertigo) 01/25/2016  . Essential hypertension 04/29/2015  . History of TIA (transient ischemic attack) 04/29/2015  . Hyperlipidemia 04/29/2015  . Diabetes ([place]) 04/29/2015  . Obese 05/17/2008  . OBSTRUCTIVE SLEEP APNEA 05/17/2008  . RESTLESS LEG SYNDROME 05/17/2008  . Coronary atherosclerosis 05/17/2008  . DIVERTICULITIS, COLON 05/17/2008  . Osteoarthritis 05/17/2008    Past Surgical History:  Procedure Laterality Date  . DUODENAL DIVERTICULECTOMY      2003  . KNEE ARTHROPLASTY Left         Home Medications    Prior to Admission medications   Medication Sig Start Date End Date Taking? Authorizing Provider  aspirin EC 81 MG tablet Take 1 tablet (81 mg total) by mouth daily. 04/26/15  Yes Hillary Bow, MD  celecoxib (CELEBREX) 200 MG capsule Take 1 capsule (200 mg total) by mouth 2 (two) times daily. Follow=up appt due in Sept must see provider for future refills 12/22/18  Yes Burns, Claudina Lick, MD  lisinopril (PRINIVIL,ZESTRIL) 20 MG tablet Take 1 tablet (20 mg total) by mouth daily. 08/28/18  Yes Burns, Claudina Lick, MD  nebivolol (BYSTOLIC) 10 MG tablet Take 1 tablet (10 mg total) by mouth daily. Patient taking differently: Take 10 mg by mouth at bedtime.  02/28/19  Yes Burns, Claudina Lick, MD  Potassium 99 MG TABS Take 198 mg by mouth daily.   Yes [provider]  rosuvastatin (CRESTOR) 10 MG tablet Take 1 tablet (10 mg total) by mouth daily. 08/28/18  Yes Burns, Claudina Lick, MD  blood glucose meter kit and supplies KIT Dispense based on patient and insurance preference. Use daily and as needed.  NIDDM 04/29/15   [name], MD  meclizine (ANTIVERT) 25 MG tablet Take 1 tablet (25 mg total) by mouth 3 (three) times daily as needed for dizziness. Patient not taking: Reported on 05/14/2019 02/24/18   [name], MD    Family History Family History  Problem Relation Age of Onset  . Transient ischemic attack Father     Social History Social History   Tobacco Use  . Smoking status: Former Smoker    Packs/day: 1.50    Years: 30.00    Pack years: 45.00    Quit date: 06/07/1996    Years since quitting: 22.9  . Smokeless tobacco: Never Used  Substance Use Topics  . Alcohol use: Yes    Comment: ocassional   . Drug use: Not on file     Allergies   Patient has no known allergies.   Review of Systems Review of Systems  Constitutional: Negative for fever.  HENT: Negative for sore throat.   Respiratory: Negative for shortness  of breath.   Cardiovascular: Negative for chest pain.  Gastrointestinal: Positive for nausea. Negative for abdominal pain and vomiting.  Genitourinary: Negative for flank pain.  Musculoskeletal: Negative for back pain.  Skin: Negative for pallor and wound.  Neurological: Positive for dizziness and headaches. Negative for syncope, facial asymmetry, speech difficulty, weakness, light-headedness and numbness.     Physical Exam Updated Vital Signs BP 126/68   Pulse (!) 51   Temp 97.7 F (36.5 C) (Oral)   Resp 16   Ht 6' 3"  (1.905 m)   Wt 111.3 kg   SpO2 94%   BMI 30.67 kg/m   Physical Exam Vitals signs and nursing note reviewed.  Constitutional:      Appearance: Normal appearance. He is obese.  HENT:     Head: Normocephalic and atraumatic.     Nose: Nose normal.     Mouth/Throat:     Mouth: Mucous membranes are moist.  Eyes:     Pupils: Pupils are equal, round, and reactive to light.  Neck:     Musculoskeletal: Normal range of motion and neck supple.  Cardiovascular:     Rate and Rhythm: Normal rate.  Pulmonary:     Effort: Pulmonary effort is normal.     Breath sounds: No wheezing or rales.  Abdominal:     General: Abdomen is flat. Bowel sounds are normal. There is no distension.     Palpations: Abdomen is soft.     Tenderness: There is no abdominal tenderness.  Skin:    General: Skin is warm and dry.  Neurological:     Mental Status: He is alert and oriented to person, place, and time.      ED Treatments / Results  Labs (all labs ordered are listed, but only abnormal results are displayed) Labs Reviewed  COMPREHENSIVE METABOLIC PANEL - Abnormal; Notable for the following components:      Result Value   AST 52 (*)    ALT 67 (*)    Total Bilirubin 1.5 (*)    All other components within normal limits  SARS CORONAVIRUS 2 (TAT 6-24 HRS)  CBC WITH DIFFERENTIAL/PLATELET  PROTIME-INR  TROPONIN I (HIGH SENSITIVITY)  TROPONIN I (HIGH SENSITIVITY)    EKG EKG  Interpretation  Date/Time:  Monday May 14 2019 11:10:00 EST Ventricular Rate:  72 PR Interval:    QRS Duration: 107 QT Interval:  402 QTC Calculation: 440 R Axis:   65 Text Interpretation: Sinus rhythm Confirmed by Virgel Manifold [id number]) on 05/14/2019 2:12:01 PM   Radiology Ct Head Wo Contrast  Result Date: 05/14/2019 CLINICAL DATA:  Headaches, dizziness EXAM: CT HEAD WITHOUT CONTRAST TECHNIQUE: Contiguous axial images were obtained from the base of the skull through the vertex without intravenous contrast. COMPARISON:  2017 FINDINGS: Brain: There  is no acute intracranial hemorrhage, mass-effect, or edema. Gray-white differentiation is preserved. There is no extra-axial fluid collection. Ventricles and sulci are within normal limits in size and configuration. Vascular: No hyperdense vessel or unexpected calcification. Skull: Calvarium is unremarkable. Sinuses/Orbits: Minor mucosal thickening.  Orbits are unremarkable. Other: Mastoid air cells are clear. IMPRESSION: No acute intracranial hemorrhage, mass effect, or evidence of acute infarction Electronically Signed   By: Macy Mis M.D.   On: 05/14/2019 11:46    Procedures Procedures (including critical care time)  Medications Ordered in ED Medications - No data to display   Initial Impression / Assessment and Plan / ED Course  I have reviewed the triage vital signs and the nursing notes.  Pertinent labs & imaging results that were available during my care of the patient were reviewed by me and considered in my medical decision making (see chart for details).  Patient with a past medical history of TIA, HTN presents to the ED for sudden onset of dizziness, this was accompanied by nausea, flushed feeling, chest tightness which began 45 minutes prior to arrival in the ED.  He also reports seeing floaters, states his central vision was completely gone.  He does have a prior history of vertigo, states the dizziness felt different, he  also did take a meclizine this morning.  He arrived in the ED hypertensive with a systolic of 709/643, no tachycardia.  He is satting at 96% on room air without any increase of breathing or tachypnea.  Teleneuro consultation was done.  1:13 PM Spoke to neurologist on teleneuro who reported spots central visual field loss, he suspected likely presyncopal episode.  Patient is currently not a candidate for TPA however, he should have aspirin 81 mg, Plavix 75 mg, Lipitor 80 mg.  He recommends admission into the hospital for further evaluation.  CBC was unremarkable.  CMP without any electrolyte derangement, transaminase, this is normal for patient.  PT and INR within normal limits.  First troponin was negative.  CT of his head personally evaluated by me did not show any infarct, hemorrhage, acute abnormality.  2:34 PMSpoke to hospitalist service, they will admit patient for further work-up, appreciate their help.  Portions of this note were generated with [employer]. Dictation errors may occur despite best attempts at proofreading.  Final Clinical Impressions(s) / ED Diagnoses   Final diagnoses:  Dizziness  Near syncope    ED Discharge Orders    None       Janeece Fitting, Hershal Coria 05/14/19 1434    Virgel Manifold, MD 05/15/19 [phone]

## 2019-05-14 NOTE — ED Triage Notes (Addendum)
Pt reports about 50 minutes ago he started seeing spots and feeling dizzy. Pt reports being on his way to his doctor to get blood work drawn but was not feeling right so he came here. Pt reports having trouble controlling BP. Pt reports hx of TIAs.

## 2019-05-14 NOTE — H&P (Signed)
History and Physical    Sean Mullins CBS:[id number] DOB: 1951/01/18 DOA: 05/14/2019  PCP: [name], MD   Patient coming from: home  I have personally briefly reviewed patient's old medical records in [place]  Chief Complaint: Floaters/dizziness  HPI: Sean Mullins is a 68 y.o. male with medical history significant of HTN, HLD, TIA who presents this morning with episode of lightheadedness and floaters.  Patient reports he was in his USOH thru last week when he began his new blood pressure medications.  He states prior to that he had been feeling lousy but thought he noticed he was generally feeling better after beginning his nebivolol.  He was driving on the highway to see his PCP but noticed that he began having floaters in his vision which he timed with his heart beat.  He reports this was around 10 am this morning.  He reports he had similar symptoms with his prior TIA a few years back so he became concerned and presented to the [facility] ER instead.  He states he was having lightheadedness and nausea as well (felt like he was having bad GERD).  He denies any other focal deficits.  His wife who is at bedside, concurs with his history and states last time however he did look worse and did not have any recollection of his symptoms.  He has not had any issues with his speech or swallow.  No double vision.  He did notice he had higher blood pressures this morning (reports in the range of 846-659D systolic).  He had a headache as well.  He denies any fevers, chills, diarrhea.  He is not having other signs/symptoms of infection.  No tobacco, occasional EtOH, no drugs  Review of Systems: As per HPI otherwise 10 point review of systems negative.   Past Medical History:  Diagnosis Date   Anxiety    Arthritis    Diverticula of colon    TIA (transient ischemic attack)    November 2013    Past Surgical History:  Procedure Laterality Date   DUODENAL DIVERTICULECTOMY     2003   KNEE ARTHROPLASTY Left      reports that he quit smoking about 22 years ago. He has a 45.00 pack-year smoking history. He has never used smokeless tobacco. He reports current alcohol use. No history on file for drug.  No Known Allergies  Family History  Problem Relation Age of Onset   Transient ischemic attack Father     Prior to Admission medications   Medication Sig Start Date End Date Taking? Authorizing Provider  aspirin EC 81 MG tablet Take 1 tablet (81 mg total) by mouth daily. 04/26/15  Yes Hillary Bow, MD  celecoxib (CELEBREX) 200 MG capsule Take 1 capsule (200 mg total) by mouth 2 (two) times daily. Follow=up appt due in Sept must see provider for future refills 12/22/18  Yes Burns, Claudina Lick, MD  lisinopril (PRINIVIL,ZESTRIL) 20 MG tablet Take 1 tablet (20 mg total) by mouth daily. 08/28/18  Yes Burns, Claudina Lick, MD  nebivolol (BYSTOLIC) 10 MG tablet Take 1 tablet (10 mg total) by mouth daily. Patient taking differently: Take 10 mg by mouth at bedtime.  02/28/19  Yes Burns, Claudina Lick, MD  Potassium 99 MG TABS Take 198 mg by mouth daily.   Yes [provider]  rosuvastatin (CRESTOR) 10 MG tablet Take 1 tablet (10 mg total) by mouth daily. 08/28/18  Yes Burns, Claudina Lick, MD  blood glucose meter kit  and supplies KIT Dispense based on patient and insurance preference. Use daily and as needed.  NIDDM 04/29/15   [name], MD  meclizine (ANTIVERT) 25 MG tablet Take 1 tablet (25 mg total) by mouth 3 (three) times daily as needed for dizziness. Patient not taking: Reported on 05/14/2019 02/24/18   [name], MD    Physical Exam: Vitals:   05/14/19 1330 05/14/19 1400 05/14/19 1430 05/14/19 1500  BP: 126/68 134/80 (!) 117/95 140/78  Pulse: (!) 51 (!) 52 (!) 58 (!) 51  Resp: 16 17 15 16   Temp:      TempSrc:      SpO2: 94% 95% 98% 97%  Weight:      Height:        Constitutional: NAD, calm, comfortable Vitals:   05/14/19 1330 05/14/19 1400 05/14/19 1430  05/14/19 1500  BP: 126/68 134/80 (!) 117/95 140/78  Pulse: (!) 51 (!) 52 (!) 58 (!) 51  Resp: 16 17 15 16   Temp:      TempSrc:      SpO2: 94% 95% 98% 97%  Weight:      Height:       General: pleasant, NAD Eyes: PERRL, lids and conjunctivae normal ENMT: Mucous membranes are moist. Posterior pharynx clear of any exudate or lesions.Normal dentition.  Neck: normal, supple, no masses, no thyromegaly Respiratory: clear to auscultation bilaterally, no wheezing, no crackles. Normal respiratory effort. No accessory muscle use.  Cardiovascular: Regular rate and rhythm, no murmurs / rubs / gallops. No extremity edema. 2+ pedal pulses. No carotid bruits.  Abdomen: no tenderness, no masses palpated. No hepatosplenomegaly. Bowel sounds positive.  Musculoskeletal: no clubbing / cyanosis. No joint deformity upper and lower extremities. Good ROM, no contractures. Normal muscle tone.  Skin: no rashes, lesions, ulcers. No induration Neurologic: CN 2-12 grossly intact. Sensation intact, DTR normal. Strength 5/5 in all 4.  Psychiatric: Normal judgment and insight. Alert and oriented x 3. Normal mood.    Labs on Admission: I have personally reviewed following labs and imaging studies  CBC: Recent Labs  Lab 05/14/19 1130  WBC 7.8  NEUTROABS 2.8  HGB 15.6  HCT 43.6  MCV 92.4  PLT 973   Basic Metabolic Panel: Recent Labs  Lab 05/14/19 1130  NA 139  K 4.1  CL 103  CO2 23  GLUCOSE 99  BUN 14  CREATININE 0.75  CALCIUM 9.3   GFR: Estimated Creatinine Clearance: 122.3 mL/min (by C-G formula based on SCr of 0.75 mg/dL). Liver Function Tests: Recent Labs  Lab 05/14/19 1130  AST 52*  ALT 67*  ALKPHOS 59  BILITOT 1.5*  PROT 7.7  ALBUMIN 4.4   No results for input(s): LIPASE, AMYLASE in the last 168 hours. No results for input(s): AMMONIA in the last 168 hours. Coagulation Profile: Recent Labs  Lab 05/14/19 1130  INR 0.9   Cardiac Enzymes: No results for input(s): CKTOTAL, CKMB,  CKMBINDEX, TROPONINI in the last 168 hours. BNP (last 3 results) No results for input(s): PROBNP in the last 8760 hours. HbA1C: No results for input(s): HGBA1C in the last 72 hours. CBG: No results for input(s): GLUCAP in the last 168 hours. Lipid Profile: No results for input(s): CHOL, HDL, LDLCALC, TRIG, CHOLHDL, LDLDIRECT in the last 72 hours. Thyroid Function Tests: No results for input(s): TSH, T4TOTAL, FREET4, T3FREE, THYROIDAB in the last 72 hours. Anemia Panel: No results for input(s): VITAMINB12, FOLATE, FERRITIN, TIBC, IRON, RETICCTPCT in the last 72 hours. Urine analysis:  Component Value Date/Time   COLORURINE YELLOW 01/09/2016 1034   APPEARANCEUR CLEAR 01/09/2016 1034   LABSPEC 1.014 01/09/2016 1034   PHURINE 7.5 01/09/2016 [place] 01/09/2016 1034   HGBUR NEGATIVE 01/09/2016 1034   BILIRUBINUR NEGATIVE 01/09/2016 1034   KETONESUR NEGATIVE 01/09/2016 1034   PROTEINUR NEGATIVE 01/09/2016 1034   NITRITE NEGATIVE 01/09/2016 1034   LEUKOCYTESUR NEGATIVE 01/09/2016 1034    Radiological Exams on Admission: Ct Head Wo Contrast  Result Date: 05/14/2019 CLINICAL DATA:  Headaches, dizziness EXAM: CT HEAD WITHOUT CONTRAST TECHNIQUE: Contiguous axial images were obtained from the base of the skull through the vertex without intravenous contrast. COMPARISON:  2017 FINDINGS: Brain: There is no acute intracranial hemorrhage, mass-effect, or edema. Gray-white differentiation is preserved. There is no extra-axial fluid collection. Ventricles and sulci are within normal limits in size and configuration. Vascular: No hyperdense vessel or unexpected calcification. Skull: Calvarium is unremarkable. Sinuses/Orbits: Minor mucosal thickening.  Orbits are unremarkable. Other: Mastoid air cells are clear. IMPRESSION: No acute intracranial hemorrhage, mass effect, or evidence of acute infarction Electronically Signed   By: Macy Mis M.D.   On: 05/14/2019 11:46    EKG:  Independently reviewed.  Assessment/Plan Sean Mullins is a 68 y.o. male with medical history significant of HTN, HLD, TIA who presents this morning with episode of lightheadedness and floaters with concern for possible TIA.  # Possible TIA vs hypertensive urgency vs. Pre-syncope - patient with floaters and headaches/dizziness but with similarities to previously reported TIA.  Appreciate Neuro consult from Dr. Elissa Hefty, who recommend stroke admission and initiation of aspirin and plavix. - stroke protocol, permissive HTN - Echo, MRI pending, carotid dopplers - would perform orthostatics tomorrow once 24 hour period has passed to eval for syncope as etiology - telemetry  # HTN - held antihypertensives  # HLD - switched from crestor to high intensity dose of lipitor  # Obesity - BMI 30.67 - affects all aspects of care  DVT prophylaxis: Lovenox Code Status: Full Family Communication: Wife at bedside and updated Disposition Plan: pending Consults called: Neurology, Dr. Elissa Hefty Admission status: Med-tele   Truddie Hidden MD [facility] Pager [phone]  If 7PM-7AM, please contact night-coverage [email or web address] Password [facility]  05/14/2019, 3:46 PM

## 2019-05-14 NOTE — ED Notes (Signed)
CBG 94 

## 2019-05-15 ENCOUNTER — Observation Stay (HOSPITAL_COMMUNITY): Payer: PPO

## 2019-05-15 ENCOUNTER — Observation Stay (HOSPITAL_BASED_OUTPATIENT_CLINIC_OR_DEPARTMENT_OTHER): Payer: PPO

## 2019-05-15 ENCOUNTER — Encounter (HOSPITAL_COMMUNITY): Payer: PPO

## 2019-05-15 DIAGNOSIS — R42 Dizziness and giddiness: Secondary | ICD-10-CM | POA: Diagnosis not present

## 2019-05-15 DIAGNOSIS — Z79899 Other long term (current) drug therapy: Secondary | ICD-10-CM | POA: Diagnosis not present

## 2019-05-15 DIAGNOSIS — R55 Syncope and collapse: Secondary | ICD-10-CM

## 2019-05-15 DIAGNOSIS — Z6830 Body mass index (BMI) 30.0-30.9, adult: Secondary | ICD-10-CM | POA: Diagnosis not present

## 2019-05-15 DIAGNOSIS — G43909 Migraine, unspecified, not intractable, without status migrainosus: Secondary | ICD-10-CM | POA: Diagnosis not present

## 2019-05-15 DIAGNOSIS — Z87891 Personal history of nicotine dependence: Secondary | ICD-10-CM | POA: Diagnosis not present

## 2019-05-15 DIAGNOSIS — G459 Transient cerebral ischemic attack, unspecified: Secondary | ICD-10-CM

## 2019-05-15 DIAGNOSIS — Z8719 Personal history of other diseases of the digestive system: Secondary | ICD-10-CM | POA: Diagnosis not present

## 2019-05-15 DIAGNOSIS — Z9049 Acquired absence of other specified parts of digestive tract: Secondary | ICD-10-CM | POA: Diagnosis not present

## 2019-05-15 DIAGNOSIS — R7303 Prediabetes: Secondary | ICD-10-CM | POA: Diagnosis not present

## 2019-05-15 DIAGNOSIS — I10 Essential (primary) hypertension: Secondary | ICD-10-CM | POA: Diagnosis not present

## 2019-05-15 DIAGNOSIS — Z8673 Personal history of transient ischemic attack (TIA), and cerebral infarction without residual deficits: Secondary | ICD-10-CM | POA: Diagnosis not present

## 2019-05-15 DIAGNOSIS — Z20828 Contact with and (suspected) exposure to other viral communicable diseases: Secondary | ICD-10-CM | POA: Diagnosis not present

## 2019-05-15 DIAGNOSIS — E669 Obesity, unspecified: Secondary | ICD-10-CM | POA: Diagnosis not present

## 2019-05-15 DIAGNOSIS — Z7982 Long term (current) use of aspirin: Secondary | ICD-10-CM | POA: Diagnosis not present

## 2019-05-15 DIAGNOSIS — E785 Hyperlipidemia, unspecified: Secondary | ICD-10-CM | POA: Diagnosis not present

## 2019-05-15 LAB — CBC
HCT: 42.4 % (ref 39.0–52.0)
Hemoglobin: 14.7 g/dL (ref 13.0–17.0)
MCH: 32.5 pg (ref 26.0–34.0)
MCHC: 34.7 g/dL (ref 30.0–36.0)
MCV: 93.6 fL (ref 80.0–100.0)
Platelets: 186 10*3/uL (ref 150–400)
RBC: 4.53 MIL/uL (ref 4.22–5.81)
RDW: 12.8 % (ref 11.5–15.5)
WBC: 5.7 10*3/uL (ref 4.0–10.5)
nRBC: 0 % (ref 0.0–0.2)

## 2019-05-15 LAB — GLUCOSE, CAPILLARY
Glucose-Capillary: 188 mg/dL — ABNORMAL HIGH (ref 70–99)
Glucose-Capillary: 92 mg/dL (ref 70–99)

## 2019-05-15 LAB — CREATININE, SERUM
Creatinine, Ser: 0.79 mg/dL (ref 0.61–1.24)
GFR calc Af Amer: >60 mL/min (ref >60)
GFR calc non Af Amer: >60 mL/min (ref >60)

## 2019-05-15 LAB — LIPID PANEL
Cholesterol: 95 mg/dL (ref 0–200)
HDL: 30 mg/dL — ABNORMAL LOW (ref >40)
LDL Cholesterol: 45 mg/dL (ref 0–99)
Total CHOL/HDL Ratio: 3.2 RATIO
Triglycerides: 101 mg/dL (ref <150)
VLDL: 20 mg/dL (ref 0–40)

## 2019-05-15 LAB — HEMOGLOBIN A1C
Hgb A1c MFr Bld: 6.2 % — ABNORMAL HIGH (ref 4.8–5.6)
Mean Plasma Glucose: 131.24 mg/dL

## 2019-05-15 LAB — ECHOCARDIOGRAM COMPLETE
Height: 75 in
Weight: 3926.4 oz

## 2019-05-15 LAB — HIV ANTIBODY (ROUTINE TESTING W REFLEX): HIV Screen 4th Generation wRfx: NONREACTIVE

## 2019-05-15 MED ORDER — NEBIVOLOL HCL 10 MG PO TABS: 10.0000 mg | ORAL_TABLET | Freq: Every day | ORAL | Status: DC

## 2019-05-15 MED ORDER — KETOROLAC TROMETHAMINE 30 MG/ML IJ SOLN
30.0000 mg | Freq: Once | INTRAMUSCULAR | Status: AC
  Administered 2019-05-15: 30 mg via INTRAVENOUS
  Filled 2019-05-15: qty 1

## 2019-05-15 MED ORDER — CLOPIDOGREL BISULFATE 75 MG PO TABS
75.0000 mg | ORAL_TABLET | Freq: Every day | ORAL | Status: DC
  Administered 2019-05-15: 75 mg via ORAL
  Filled 2019-05-15 (×2): qty 1

## 2019-05-15 MED ORDER — ENOXAPARIN SODIUM 40 MG/0.4ML ~~LOC~~ SOLN
40.0000 mg | SUBCUTANEOUS | Status: DC
  Administered 2019-05-15: 40 mg via SUBCUTANEOUS
  Filled 2019-05-15: qty 0.4

## 2019-05-15 MED ORDER — ATORVASTATIN CALCIUM 40 MG PO TABS
80.0000 mg | ORAL_TABLET | Freq: Every day | ORAL | Status: DC
  Filled 2019-05-15: qty 2

## 2019-05-15 MED ORDER — STROKE: EARLY STAGES OF RECOVERY BOOK
Freq: Once | Status: DC
  Filled 2019-05-15: qty 1

## 2019-05-15 MED ORDER — SENNOSIDES-DOCUSATE SODIUM 8.6-50 MG PO TABS: 1.0000 | ORAL_TABLET | Freq: Every evening | ORAL | Status: DC | PRN

## 2019-05-15 MED ORDER — ASPIRIN EC 81 MG PO TBEC
81.0000 mg | DELAYED_RELEASE_TABLET | Freq: Every day | ORAL | Status: DC
  Administered 2019-05-15: 81 mg via ORAL
  Filled 2019-05-15: qty 1

## 2019-05-15 MED ORDER — PROCHLORPERAZINE EDISYLATE 10 MG/2ML IJ SOLN
10.0000 mg | Freq: Once | INTRAMUSCULAR | Status: AC
  Administered 2019-05-15: 10 mg via INTRAVENOUS
  Filled 2019-05-15: qty 2

## 2019-05-15 MED ORDER — LORAZEPAM 2 MG/ML IJ SOLN
2.0000 mg | Freq: Once | INTRAMUSCULAR | Status: AC
  Administered 2019-05-15: 2 mg via INTRAVENOUS
  Filled 2019-05-15: qty 1

## 2019-05-15 MED ORDER — DIPHENHYDRAMINE HCL 50 MG/ML IJ SOLN
25.0000 mg | Freq: Once | INTRAMUSCULAR | Status: AC
  Administered 2019-05-15: 25 mg via INTRAVENOUS
  Filled 2019-05-15: qty 1

## 2019-05-15 NOTE — Progress Notes (Signed)
Echocardiogram 2D Echocardiogram has been performed.  Oneal Deputy Allaina Brotzman 05/15/2019, 3:41 PM

## 2019-05-15 NOTE — Discharge Summary (Signed)
Physician Discharge Summary  DELFINO SEMO [id number] DOB: 04-21-1951  PCP: [name], MD  Admitted from: Home Discharged to: Home  Admit date: 05/14/2019 Discharge date: 05/15/2019  Recommendations for Outpatient Follow-up:   Follow-up Information    [name], MD. Schedule an appointment as soon as possible for a visit in 1 week(s).   Specialty: Internal Medicine Why: To be seen with repeat labs (CBC & BMP). Contact information: [place] [id number] [facility]. Schedule an appointment as soon as possible for a visit in 2 week(s).   Why: Follow-up and evaluation of suspected migraine.           Home Health: None Equipment/Devices: None  Discharge Condition: Improved and stable CODE STATUS: Full Diet recommendation: Heart healthy diet.  Discharge Diagnoses:  Principal Problem:   Migraine   Brief Summary: 68 year old married male, independent, works as a Chief Technology Officer, PMH of hypertension, hyperlipidemia, TIA, was in his usual state of health on 12/7, had breakfast at around 6 AM and went to work.  After finishing work around 11 AM, he had been driving for about 40 minutes on his way to his PCPs office for some lab work when he subacutely started experiencing a flushed sensation of his upper body along with awake chest discomfort but not pain, some dizziness along with floaters in both his visual fields with a weird sensation in his head which seemed to progressively get worse.  He also took a wrong turn while driving and had to turn around and get back on the right road.  He did not like how he felt and hence proceeded straight to the ED.  He denies having any asymmetric limb weakness, tingling or numbness.  In the ED, transiently for a couple of seconds he felt like he was going to pass out.  He reports that the symptoms this time were different than his prior TIA a few years back.  His visual  symptoms i.e. floaters lasted for about 45 minutes and then spontaneously resolved.  In the ED he was hypertensive with BP of 191/103.  EDP consulted tele neurology who were concerned of an acute ischemic stroke versus other vascular nonvascular conditions and recommended further evaluation.  Patient had initially come to [facility] emergency room.  He was then transferred to [facility] for further evaluation and for evaluation by neurology.  This morning patient reported transient intermittent horizontal diplopia.  Assessment and plan:  1. Possible migraine: Patient underwent extensive evaluation for suspected stroke versus TIA.  CT head and MRI brain negative for acute stroke.  TTE showed LVEF of 60-65%.  Mild LVH.  Grade 1 diastolic dysfunction.  Right ICA 40 to 59% stenosis and left ICA 1 to 39% stenosis.  A1c 6.2.  LDL 45.  Neurology was consulted.  They felt that his presentation was likely due to new onset complicated migraine due to the fact that patient initially had what could be considered an aura of spots in his vision and diplopia followed by a throbbing headache, symptoms which improved with Tylenol.  In addition, he had negative MRI while symptomatic.  Other DD: Hypotensive episode while driving with incidental headache following this versus recurrent TIA given prior history of TIA and documented atherosclerotic stenosis of bilateral ICAs.  The recommended work-up as above which was completed.  He received a migraine cocktail.  His headache and visual symptoms have resolved.  He felt slightly woozy after the medications which has also almost worn off.  I discussed in detail with Dr. Cheral Marker, Neurology who cleared patient for discharge home on prior aspirin, no need to continue Plavix that had been started this admission, continue prior Crestor without change, and outpatient follow-up with Neurology.  He also felt that the carotid ultrasound was better compared to 2016. 2. Near  syncope: Reportedly very transient.  Evaluation as noted above.  Telemetry without arrhythmias. HS troponin x2: Negative.  EKG showed sinus rhythm, no acute changes and QTc of 440 ms.  As discussed with Neurology, patient and spouse were advised regarding driving restrictions and to use his best judgment.  They verbalized understanding 3. Essential hypertension: Controlled.  Continue prior home dose of lisinopril and bisoprolol. 4. Prediabetes: A1c of 6.1.  Outpatient follow-up.  Diet, exercise and weight loss. 5. Hyperlipidemia: Well-controlled.  Continue Crestor.  Patient has mild AST and ALT elevation which appears to be chronic. 6. Obesity/Body mass index is 30.67 kg/m.  7. History of vertigo: Reportedly not taking meclizine anymore. 8. History of diverticulosis, s/p partial colectomy: Reports that he had 8 inches of colon removed in the past.    Consultations:  Neurology  Procedures:  TTE 05/15/2019:  IMPRESSIONS    1. Left ventricular ejection fraction, by visual estimation, is 60 to 65%. The left ventricle has normal function. There is mildly increased left ventricular hypertrophy.  2. Left ventricular diastolic parameters are consistent with Grade I diastolic dysfunction (impaired relaxation).  3. The left ventricle has no regional wall motion abnormalities.  4. Global right ventricle has normal systolic function.The right ventricular size is normal. No increase in right ventricular wall thickness.  5. Left atrial size was normal.  6. Right atrial size was normal.  7. Mild mitral annular calcification.  8. The mitral valve is normal in structure. No evidence of mitral valve regurgitation. No evidence of mitral stenosis.  9. The tricuspid valve is normal in structure. Tricuspid valve regurgitation is not demonstrated. 10. The aortic valve is tricuspid. Aortic valve regurgitation is not visualized. Mild aortic valve sclerosis without stenosis. 11. TR signal is inadequate for  assessing pulmonary artery systolic pressure. 12. The inferior vena cava is normal in size with greater than 50% respiratory variability, suggesting right atrial pressure of 3 mmHg.   Discharge Instructions  Discharge Instructions    Ambulatory referral to Neurology   Complete by: As directed    An appointment is requested in approximately: 2 weeks   Call MD for:   Complete by: As directed    Recurrent visual disturbances or strokelike symptoms.   Call MD for:  severe uncontrolled pain   Complete by: As directed    Diet - low sodium heart healthy   Complete by: As directed    Increase activity slowly   Complete by: As directed        Medication List    STOP taking these medications   meclizine 25 MG tablet Commonly known as: ANTIVERT     TAKE these medications   aspirin EC 81 MG tablet Take 1 tablet (81 mg total) by mouth daily.   celecoxib 200 MG capsule Commonly known as: CELEBREX Take 1 capsule (200 mg total) by mouth 2 (two) times daily. Follow=up appt due in Sept must see provider for future refills   lisinopril 20 MG tablet Commonly known as: ZESTRIL Take 1 tablet (20 mg total) by mouth daily.   nebivolol 10 MG tablet Commonly known  as: Bystolic Take 1 tablet (10 mg total) by mouth at bedtime.   rosuvastatin 10 MG tablet Commonly known as: CRESTOR Take 1 tablet (10 mg total) by mouth daily.      No Known Allergies    Procedures/Studies: Dg Chest 2 View  Result Date: 05/15/2019 CLINICAL DATA:  Shortness of breath, dizziness, chest pain. EXAM: CHEST - 2 VIEW COMPARISON:  01/09/2016.  04/26/2015.  01/30/2008. FINDINGS: Mild prominence of the azygos vein noted, this is most likely secondary to semi-erect AP positioning. Mediastinum and hilar structures are otherwise unremarkable. Heart size normal. Mild bibasilar subsegmental atelectasis. Mild bibasilar infiltrates cannot be excluded. No pleural effusion or pneumothorax. No acute bony abnormality  identified. IMPRESSION: Low lung volumes with mild bibasilar subsegmental atelectasis. Mild bibasilar infiltrates cannot be excluded. Electronically Signed   By: Marcello Moores  Register   On: 05/15/2019 07:35   Ct Head Wo Contrast  Result Date: 05/14/2019 CLINICAL DATA:  Headaches, dizziness EXAM: CT HEAD WITHOUT CONTRAST TECHNIQUE: Contiguous axial images were obtained from the base of the skull through the vertex without intravenous contrast. COMPARISON:  2017 FINDINGS: Brain: There is no acute intracranial hemorrhage, mass-effect, or edema. Gray-white differentiation is preserved. There is no extra-axial fluid collection. Ventricles and sulci are within normal limits in size and configuration. Vascular: No hyperdense vessel or unexpected calcification. Skull: Calvarium is unremarkable. Sinuses/Orbits: Minor mucosal thickening.  Orbits are unremarkable. Other: Mastoid air cells are clear. IMPRESSION: No acute intracranial hemorrhage, mass effect, or evidence of acute infarction Electronically Signed   By: Macy Mis M.D.   On: 05/14/2019 11:46   Mr Brain Wo Contrast  Result Date: 05/15/2019 CLINICAL DATA:  TIA. Uncontrolled hypertension and seeing spots yesterday. Visual abnormalities have resolved currently. EXAM: MRI HEAD WITHOUT CONTRAST TECHNIQUE: Multiplanar, multiecho pulse sequences of the brain and surrounding structures were obtained without intravenous contrast. COMPARISON:  01/09/2016 FINDINGS: Brain: No acute septae or subacute infarction, hemorrhage, hydrocephalus, extra-axial collection or mass lesion. No white matter disease or atrophy Vascular: Normal flow voids. Strongly dominant right vertebral artery. Skull and upper cervical spine: Normal marrow signal Sinuses/Orbits: Small proteinaceous retention cysts in the maxillary sinuses. Negative orbits IMPRESSION: Negative brain MRI. No infarct or other explanation for visual symptoms Electronically Signed   By: Monte Fantasia M.D.   On:  05/15/2019 07:36   Vas US Carotid (at [place] Only)  Result Date: 05/15/2019 Carotid Arterial Duplex Study Indications:       TIA. Comparison Study:  04/26/15 at outside facility: Bilat 50-69% stenosis Performing Technologist: June Leap RDMS, RVT  Examination Guidelines: A complete evaluation includes B-mode imaging, spectral Doppler, color Doppler, and power Doppler as needed of all accessible portions of each vessel. Bilateral testing is considered an integral part of a complete examination. Limited examinations for reoccurring indications may be performed as noted.  Right Carotid Findings: +----------+--------+--------+--------+-------------------------+--------+           PSV cm/sEDV cm/sStenosisPlaque Description       Comments +----------+--------+--------+--------+-------------------------+--------+ CCA Prox  155     22                                                +----------+--------+--------+--------+-------------------------+--------+ CCA Distal75      21                                                +----------+--------+--------+--------+-------------------------+--------+  ICA Prox  136     39      40-59%  heterogenous and calcific         +----------+--------+--------+--------+-------------------------+--------+ ICA Distal112     21                                                +----------+--------+--------+--------+-------------------------+--------+ ECA       140     12                                                +----------+--------+--------+--------+-------------------------+--------+ +----------+--------+-------+----------------+-------------------+           PSV cm/sEDV cmsDescribe        Arm Pressure (mmHG) +----------+--------+-------+----------------+-------------------+ [id number]            Multiphasic, WNL                    +----------+--------+-------+----------------+-------------------+  +---------+--------+--+--------+--+---------+ VertebralPSV cm/s48EDV cm/s12Antegrade +---------+--------+--+--------+--+---------+  Left Carotid Findings: +----------+--------+--------+--------+------------------+--------+           PSV cm/sEDV cm/sStenosisPlaque DescriptionComments +----------+--------+--------+--------+------------------+--------+ CCA Prox  159     19                                         +----------+--------+--------+--------+------------------+--------+ CCA Distal79      15                                         +----------+--------+--------+--------+------------------+--------+ ICA Prox  80      25      1-39%   heterogenous               +----------+--------+--------+--------+------------------+--------+ ICA Distal130     32                                         +----------+--------+--------+--------+------------------+--------+ ECA       123     21                                         +----------+--------+--------+--------+------------------+--------+ +----------+--------+--------+----------------+-------------------+           PSV cm/sEDV cm/sDescribe        Arm Pressure (mmHG) +----------+--------+--------+----------------+-------------------+ Subclavian214             Multiphasic, WNL                    +----------+--------+--------+----------------+-------------------+ +---------+--------+--------+-------------------------+ VertebralPSV cm/sEDV cm/sAbsent and Not identified +---------+--------+--------+-------------------------+  Summary: Right Carotid: Velocities in the right ICA are consistent with a 40-59%                stenosis. Left Carotid: Velocities in the left ICA are consistent with a 1-39% stenosis.  *See table(s) above for measurements and observations.  Electronically signed by Harold Barban MD on 05/15/2019 at 5:20:25 PM.    Final  Subjective: When seen this morning, patient denied having any  more floaters but had transient intermittent horizontal diplopia.  To me he denied headache but subsequently to the neurology team he reported headache.  Since the migraine cocktail, headache and floaters have resolved.  Felt slightly weak after medications but that has significantly improved.  His wife will be driving him home.  Discharge Exam:  Vitals:   05/15/19 0750 05/15/19 0832 05/15/19 1158 05/15/19 1554  BP: 123/69  129/65 (!) 120/53  Pulse: (!) 54 (!) 59 62 60  Resp: 16  16 19   Temp:  (!) 97.4 F (36.3 C) 98.4 F (36.9 C) 98 F (36.7 C)  TempSrc:  Oral Oral Oral  SpO2: 98% 95% 94% 94%  Weight:      Height:        General: Pleasant middle-age male, moderately built and obese lying comfortably propped up in bed without distress. Cardiovascular: S1 & S2 heard, RRR, S1/S2 +. No murmurs, rubs, gallops or clicks. No JVD or pedal edema.  Telemetry personally reviewed: Sinus rhythm. Respiratory: Clear to auscultation without wheezing, rhonchi or crackles. No increased work of breathing. Abdominal:  Non distended, non tender & soft. No organomegaly or masses appreciated. Normal bowel sounds heard. CNS: Alert and oriented. No focal deficits.  No pronator drift. Extremities: no edema, no cyanosis    The results of significant diagnostics from this hospitalization (including imaging, microbiology, ancillary and laboratory) are listed below for reference.     Microbiology: Recent Results (from the past 240 hour(s))  SARS CORONAVIRUS 2 (TAT 6-24 HRS) Nasopharyngeal Nasopharyngeal Swab     Status: None   Collection Time: 05/14/19  2:03 PM   Specimen: Nasopharyngeal Swab  Result Value Ref Range Status   SARS Coronavirus 2 NEGATIVE NEGATIVE Final    Comment: (NOTE) SARS-CoV-2 target nucleic acids are NOT DETECTED. The SARS-CoV-2 RNA is generally detectable in upper and lower respiratory specimens during the acute phase of infection. Negative results do not preclude SARS-CoV-2  infection, do not rule out co-infections with other pathogens, and should not be used as the sole basis for treatment or other patient management decisions. Negative results must be combined with clinical observations, patient history, and epidemiological information. The expected result is Negative. Fact Sheet for Patients: SugarRoll.be Fact Sheet for Healthcare Providers: https://www.woods-mathews.com/ This test is not yet approved or cleared by the [place] FDA and  has been authorized for detection and/or diagnosis of SARS-CoV-2 by FDA under an Emergency Use Authorization (EUA). This EUA will remain  in effect (meaning this test can be used) for the duration of the COVID-19 declaration under Section 56 4(b)(1) of the Act, 21 U.S.C. section 360bbb-3(b)(1), unless the authorization is terminated or revoked sooner. Performed at [facility], [place] [address] [place], [place] [address]      Labs: CBC: Recent Labs  Lab 05/14/19 1130 05/15/19 0500  WBC 7.8 5.7  NEUTROABS 2.8  --   HGB 15.6 14.7  HCT 43.6 42.4  MCV 92.4 93.6  PLT 210 [id number]    Basic Metabolic Panel: Recent Labs  Lab 05/14/19 1130 05/15/19 0500  NA 139  --   K 4.1  --   CL 103  --   CO2 23  --   GLUCOSE 99  --   BUN 14  --   CREATININE 0.75 0.79  CALCIUM 9.3  --     Liver Function Tests: Recent Labs  Lab 05/14/19 1130  AST 52*  ALT 67*  ALKPHOS 59  BILITOT 1.5*  PROT 7.7  ALBUMIN 4.4    CBG: Recent Labs  Lab 05/14/19 1108 05/15/19 1116 05/15/19 1536  GLUCAP 94 188* 92    Hgb A1c Recent Labs    05/15/19 0530  HGBA1C 6.2*    Lipid Profile Recent Labs    05/15/19 0530  CHOL 95  HDL 30*  LDLCALC 45  TRIG 101  CHOLHDL 3.2   I discussed in detail with patient spouse, updated care and answered questions.   Time coordinating discharge: 35 minutes  SIGNED:  Vernell Leep, MD, [place], [facility]. [facility]  To  contact the attending provider between 7A-7P or the covering provider during after hours 7P-7A, please log into the web site [email or web address] and access using universal [place] password for that web site. If you do not have the password, please call the hospital operator.

## 2019-05-15 NOTE — Evaluation (Signed)
Physical Therapy Evaluation Patient Details Name: SHYRONE BERTSCH MRN: [id number] DOB: [birth year] Today's Date: 05/15/2019   History of Present Illness  Pt adm with visual changes, headache and not feeling right. CT and MRI negative. Work up in progress. PMH - TIA, arthritis, anxiety  Clinical Impression  Pt doing well with mobility and no further PT needed.  Ready for dc from PT standpoint.      Follow Up Recommendations No PT follow up    Equipment Recommendations  None recommended by PT    Recommendations for Other Services       Precautions / Restrictions Precautions Precautions: None Restrictions Weight Bearing Restrictions: No      Mobility  Bed Mobility Overal bed mobility: Modified Independent             General bed mobility comments: incr time  Transfers Overall transfer level: Independent Equipment used: None                Ambulation/Gait Ambulation/Gait assistance: Independent Gait Distance (Feet): 500 Feet Assistive device: None Gait Pattern/deviations: WFL(Within Functional Limits) Gait velocity: decr Gait velocity interpretation: >2.62 ft/sec, indicative of community ambulatory General Gait Details: Steady gait in busy environment. No problems with head turns or changes of directions  Stairs Stairs: Yes Stairs assistance: Modified independent (Device/Increase time) Stair Management: One rail Right;Alternating pattern;Forwards Number of Stairs: 5    Wheelchair Mobility    Modified Rankin (Stroke Patients Only)       Balance Overall balance assessment: No apparent balance deficits (not formally assessed)                                           Pertinent Vitals/Pain      Home Living Family/patient expects to be discharged to:: Private residence Living Arrangements: Spouse/significant other Available Help at Discharge: Family Type of Home: House       Home Layout: One level Home Equipment:  None      Prior Function Level of Independence: Independent         Comments: works     Journalist, newspaper        Extremity/Trunk Assessment        Lower Extremity Assessment Lower Extremity Assessment: Overall WFL for tasks assessed       Communication   Communication: No difficulties  Cognition Arousal/Alertness: Awake/alert Behavior During Therapy: WFL for tasks assessed/performed Overall Cognitive Status: Within Functional Limits for tasks assessed                                        General Comments      Exercises     Assessment/Plan    PT Assessment Patent does not need any further PT services  PT Problem List         PT Treatment Interventions      PT Goals (Current goals can be found in the Care Plan section)  Acute Rehab PT Goals PT Goal Formulation: All assessment and education complete, DC therapy    Frequency     Barriers to discharge        Co-evaluation               AM-PAC PT "6 Clicks" Mobility  Outcome Measure Help needed turning from your back to your side while  in a flat bed without using bedrails?: None Help needed moving from lying on your back to sitting on the side of a flat bed without using bedrails?: None Help needed moving to and from a bed to a chair (including a wheelchair)?: None Help needed standing up from a chair using your arms (e.g., wheelchair or bedside chair)?: None Help needed to walk in hospital room?: None Help needed climbing 3-5 steps with a railing? : None 6 Click Score: 24    End of Session   Activity Tolerance: Patient tolerated treatment well Patient left: Other (comment)(standing at sink)   PT Visit Diagnosis: Other symptoms and signs involving the nervous system (R29.898)    Time: [id number] PT Time Calculation (min) (ACUTE ONLY): 11 min   Charges:   PT Evaluation $PT Eval Low Complexity: [place] Pager  [phone] Office [place] 05/15/2019, 5:22 PM

## 2019-05-15 NOTE — Consult Note (Addendum)
Neurology Consultation  Reason for Consult: Diplopia  Referring Physician: Dr. Algis Liming  CC: Floaters and diplopia  History is obtained from: Patient  HPI: Sean Mullins is a 68 y.o. male with past medical history of TIA, diverticulitis, arthritis and anxiety.  Patient states that he was feeling fine yesterday until about 10:30 AM when he was driving his car and noted he was having some chest discomfort, spots in his vision, along with a weird sensation in his head.  During this episode and after he noted that he had a headache that was severe, throbbing, and constant.  Patient came to the hospital to be evaluated.  He had no other symptoms such as numbness, tingling, or weakness or lateralizing symptoms.  He states that when he is given Tylenol his headache improves and his symptoms improved.  He does get headaches every now and then however very seldom.  While in the room initially stated that he was seeing double, but after my examination he stated he did not have double vision, but continues to have headache.  04/26/2015  Carotid doppler 04/26/2015-- Right: Heterogeneous plaque at the carotid bifurcation, with discordant results regarding degree of stenosis by established duplex criteria. Peak velocity suggests 50%- 69% stenosis, with the ICA/ CCA ratio suggesting a lesser degree of stenosis. If establishing a more accurate degree of stenosis is required, cerebral angiogram should be considered, or as a second best test, CTA.   Left: Heterogeneous plaque at the carotid bifurcation, with discordant results regarding degree of stenosis by established duplex criteria. Peak velocity suggests 50%- 69% stenosis, with the ICA/ CCA ratio suggesting a lesser degree of stenosis. If establishing a more accurate degree of stenosis is required, cerebral angiogram should be considered, or as a second best test, CTA.   Left vertebral artery demonstrates high resistance flow, which is most  likely related to the intracranial segment small caliber, present on comparison CTA.  Echo: 04/26/2015 Normal EF 65%  MRI brain--no acute infarct  A1c 6.5 LDL 100     Past Medical History:  Diagnosis Date  . Anxiety   . Arthritis   . Diverticula of colon   . TIA (transient ischemic attack)    November 2013    Family History  Problem Relation Age of Onset  . Transient ischemic attack Father    Social History:   reports that he quit smoking about 22 years ago. He has a 45.00 pack-year smoking history. He has never used smokeless tobacco. He reports current alcohol use. No history on file for drug.  Medications  Current Facility-Administered Medications:  .   stroke: mapping our early stages of recovery book, , Does not apply, Once, Truddie Hidden, MD, Stopped at 05/15/19 0543 .  acetaminophen (TYLENOL) tablet 650 mg, 650 mg, Oral, Q4H PRN, 650 mg at 05/14/19 2034 **OR** acetaminophen (TYLENOL) 160 MG/5ML solution 650 mg, 650 mg, Per Tube, Q4H PRN **OR** acetaminophen (TYLENOL) suppository 650 mg, 650 mg, Rectal, Q4H PRN, Truddie Hidden, MD .  aspirin EC tablet 81 mg, 81 mg, Oral, Daily, Truddie Hidden, MD .  atorvastatin (LIPITOR) tablet 80 mg, 80 mg, Oral, q1800, Truddie Hidden, MD .  clopidogrel (PLAVIX) tablet 75 mg, 75 mg, Oral, Q breakfast, Truddie Hidden, MD .  enoxaparin (LOVENOX) injection 40 mg, 40 mg, Subcutaneous, Q24H, Truddie Hidden, MD .  senna-docusate (Senokot-S) tablet 1 tablet, 1 tablet, Oral, QHS PRN, Truddie Hidden, MD  Exam: Current vital signs: BP 123/69 (BP Location: Right Arm)   Pulse (!) 59  Temp (!) 97.4 F (36.3 C) (Oral)   Resp 16   Ht 6\' 3"  (1.905 m)   Wt 111.3 kg   SpO2 95%   BMI 30.67 kg/m  Vital signs in last 24 hours: Temp:  [97.4 F (36.3 C)-97.7 F (36.5 C)] 97.4 F (36.3 C) (12/08 [id number]) Pulse Rate:  [50-69] 59 (12/08 0832) Resp:  [11-29] 16 (12/08 0750) BP: (104-191)/(52-135) 123/69 (12/08 0750) SpO2:  [89  %-98 %] 95 % (12/08 0832) Weight:  [111.3 kg-124.7 kg] 111.3 kg (12/07 1118)  ROS:   General ROS: negative for - chills, fatigue, fever, night sweats, weight gain or weight loss Psychological ROS: negative for - behavioral disorder, hallucinations, memory difficulties, mood swings or suicidal ideation Ophthalmic ROS: Positive for -double vision  ENT ROS: negative for - epistaxis, nasal discharge, oral lesions, sore throat, tinnitus or vertigo Allergy and Immunology ROS: negative for - hives or itchy/watery eyes Respiratory ROS: negative for - cough, hemoptysis, shortness of breath or wheezing Cardiovascular ROS: negative for - chest pain, dyspnea on exertion, edema or irregular heartbeat Gastrointestinal ROS: negative for - abdominal pain, diarrhea, hematemesis, nausea/vomiting or stool incontinence Genito-Urinary ROS: negative for - dysuria, hematuria, incontinence or urinary frequency/urgency Musculoskeletal ROS: Positive for - joint pain Neurological ROS: as noted in HPI Dermatological ROS: negative for rash and skin lesion changes   Physical Exam   Constitutional: Appears well-developed and well-nourished.  Psych: Affect appropriate to situation Eyes: No scleral injection HENT: No OP obstrucion Head: Normocephalic.  Cardiovascular: Normal rate and regular rhythm.  Respiratory: Effort normal, non-labored breathing GI: Soft.  No distension. There is no tenderness.  Skin: WDI  Neuro: Mental Status: Patient is awake, alert, oriented to person, place, month, year, and situation. Patient is able to give a clear and coherent history. No signs of aphasia or neglect Cranial Nerves: II: Visual Fields are full.  III,IV, VI: EOMI without ptosis or diploplia.  Notable saccadic eye movements. pupils equal, round and reactive to light V: Facial sensation is symmetric to temperature VII: Facial movement is symmetric.  VIII: hearing is intact to voice X: Palate elevates symmetrically XI:  Shoulder shrug is symmetric. XII: tongue is midline without atrophy or fasciculations.  Motor: Tone is normal. Bulk is normal. 5/5 strength was present in all four extremities.  Sensory: Sensation is symmetric to light touch and temperature in the arms and legs. Deep Tendon Reflexes: 2+ and symmetric in the biceps and patellae.  Plantars: Toes are downgoing bilaterally.  Cerebellar: FNF and HKS are intact bilaterally  Labs I have reviewed labs in epic and the results pertinent to this consultation are:   CBC    Component Value Date/Time   WBC 5.7 05/15/2019 0500   RBC 4.53 05/15/2019 0500   HGB 14.7 05/15/2019 0500   HCT 42.4 05/15/2019 0500   PLT 186 05/15/2019 0500   MCV 93.6 05/15/2019 0500   MCH 32.5 05/15/2019 0500   MCHC 34.7 05/15/2019 0500   RDW 12.8 05/15/2019 0500   LYMPHSABS 3.9 05/14/2019 1130   MONOABS 0.9 05/14/2019 1130   EOSABS 0.2 05/14/2019 1130   BASOSABS 0.0 05/14/2019 1130    CMP     Component Value Date/Time   NA 139 05/14/2019 1130   K 4.1 05/14/2019 1130   CL 103 05/14/2019 1130   CO2 23 05/14/2019 1130   GLUCOSE 99 05/14/2019 1130   BUN 14 05/14/2019 1130   CREATININE 0.79 05/15/2019 0500   CALCIUM 9.3 05/14/2019 1130   PROT 7.7  05/14/2019 1130   ALBUMIN 4.4 05/14/2019 1130   AST 52 (H) 05/14/2019 1130   ALT 67 (H) 05/14/2019 1130   ALKPHOS 59 05/14/2019 1130   BILITOT 1.5 (H) 05/14/2019 1130   GFRNONAA >60 05/15/2019 0500   GFRAA >60 05/15/2019 0500    Lipid Panel     Component Value Date/Time   CHOL 95 05/15/2019 0530   TRIG 101 05/15/2019 0530   HDL 30 (L) 05/15/2019 0530   CHOLHDL 3.2 05/15/2019 0530   VLDL 20 05/15/2019 0530   LDLCALC 45 05/15/2019 0530     Imaging I have reviewed the images obtained:  CT-scan of the brain--No acute intracranial hemorrhage, mass effect, or evidence of acute infarction  MRI examination of the brain--Negative brain MRI. No infarct or other explanation for visual Symptoms  Etta Quill PA-C [facility] [phone] 05/15/2019, 9:21 AM     Assessment:  68 year old male with a past medical history of TIA 4 years ago, arthritis, vertigo (on Meclizine) and anxiety.  Patient presents to the hospital with initial spots and floaters while driving, followed by chest discomfort and an "odd" sensation of "something not being right". He also experienced presyncopal sensation.  With the symptoms he also noted a throbbing headache, which at its worst was rated as 10/10.  Neurology was called to evaluate a new symptom of intermittent double vision starting this AM.  1. On initial exam by Neurology PA, the patient did show saccadic eye movements; at the time of follow up attending exam, this had resolved. Neurological motor and sensory exams are nonfocal and no ataxia is noted   2. DDx for presentation includes new onset of complicated migraine. Of note, basilar migraine can present with vertigo and unsteadiness, but would not explain his initial symptom of "floaters". Migraine is felt to be likely given the fact that the patient initially had what could be considered an aura of spots in his vision and diplopia followed by a throbbing headache, symptoms which improved with Tylenol.  In addition, he has had a negative MRI brain while still symptomatic.  3. Other DDx primarily consists of hypotensive episode while driving with incidental headache following this, versus recurrent TIA given prior TIA and documented atherosclerotic stenosis of bilateral ICAs on prior carotid ultrasound from 2016.    Recommendations: -Bilateral carotid ultrasound to assess for possible progression of atherosclerotic disease since 2016 --Echocardiogram -Migraine cocktail --Continue ASA, Plavix and atorvastatin  I have seen and examined the patient. I have formulated the assessment and recommendations. 68 year old male presenting with headache and visual symptoms. Exam is nonfocal. MRI brain negative.  Most likely new onset complicated migraine, although hypotensive episode or TIA are also on the DDx. Recommendations as above.  Electronically signed: Dr. Kerney Elbe

## 2019-05-15 NOTE — Progress Notes (Signed)
PT Cancellation Note  Patient Details Name: Sean Mullins MRN: [id number] DOB: Oct 03, 1950   Cancelled Treatment:    Reason Eval/Treat Not Completed: Patient not medically ready. Pt lethargic due to given a medical cocktail for dizziness and headache pain. Per RN, pt ambulated to bathroom well with her however is now "zonked". Acute PT to return as able to complete PT eval.  Kittie Plater, PT, DPT Acute Rehabilitation Services Pager #: [phone] Office #: [phone]    Sean Mullins 05/15/2019, 1:31 PM

## 2019-05-15 NOTE — Progress Notes (Signed)
Carotid duplex       has been completed. Preliminary results can be found under CV proc through chart review. Sean Mullins, BS, RDMS, RVT   

## 2019-05-15 NOTE — ED Notes (Signed)
Pt transported to MRI 

## 2019-05-15 NOTE — ED Notes (Signed)
Report called to [facility] °

## 2019-05-15 NOTE — Discharge Instructions (Addendum)

## 2019-05-16 ENCOUNTER — Telehealth: Payer: Self-pay | Admitting: *Deleted

## 2019-05-16 NOTE — Telephone Encounter (Signed)
Transition Care Management Follow-up Telephone Call   Date discharged? 05/15/19   How have you been since you were released from the hospital? Pt states he is doing fine   Do you understand why you were in the hospital? YES   Do you understand the discharge instructions? YES   Where were you discharged to? HOME   Items Reviewed:  Medications reviewed: YES, no longer taking the meclizine  Allergies reviewed: YES  Dietary changes reviewed: YES, heart healthy  Referrals reviewed: No referral recommeded   Functional Questionnaire:   Activities of Daily Living (ADLs):   He states he are independent in the following: ambulation, bathing and hygiene, feeding, continence, grooming, toileting and dressing States he doesn't require assistance    Any transportation issues/concerns?: NO   Any patient concerns? NO   Confirmed importance and date/time of follow-up visits scheduled YES, appt 05/21/19  Provider Appointment booked with Dr,Burns  Confirmed with patient if condition begins to worsen call PCP or go to the ER.  Patient was given the office number and encouraged to call back with question or concerns.  : YES

## 2019-05-20 NOTE — Progress Notes (Signed)
Subjective:    Patient ID: Sean Mullins, male    DOB: 11/04/1950, 68 y.o.   MRN: [id number]  HPI The patient is here for follow up from the hospital.  He is here with his wife.  Admitted 05/14/19 - 05/15/19  12/2 he called the office because his BP was elevated to 169/100.  He was asymptomatic.  He started bystolic 10 mg daily which he was prescribed two months ago, but only took for a few days.  He stopped it then because it made him feel wacky.  His BP the next day was 137/80.    On 12/7 he was driving and had a flushed sensation in his upper body and chest discomfort, but not pain.  He started having floaters in his vision that he timed with his heart beat.  He had a weird sensation in his head.  He drove to [facility] ED.  He also had lightheadedness and nausea and felt like he was having GERD.  He had no other symptoms.  The floaters lasted for 45 minutes. That morning his BP was 150-160/? And he had a headache. Neurology was consulted and he was admitted for possible TIA vs presyncope vs htn urgency.  Possible migraine: Neuro consulted CT head, MRI brian neg for acute stroke TTE showed LVEF 60-65%, mild LVH, Grade 1 DD Right ICA 40-59%stenosis, Left ICA 1-39% stenosis Symptoms most likely new onset complicated migraine Symptoms improved with Tylenol Other possible DD :  Hypotensive episode while driving with incidental headache  VS  Recurrent TIA He received a migraine cocktail Symptoms resolved Continue ASA, Crestor wo change Neurology follow up as outpatient  Near syncope: Transient Telemetry w/o arrhythmias HS troponin neg x 2 EKG  SR w/o acute changes  Hypertension: Controlled Continued on home lisinopril and bystolic  Prediabetes: [id number] 6.1%  Hyperlipidemia: Controlled Continue home crestor   He continues to feel very tired.  He thinks this started around the time that he started the [place].  He is taking his medications as prescribed.  His blood pressure at  home has improved with Bystolic.  The last few days his blood pressure has been well controlled.  He did bring his log with him.  He has occasional lightheadedness, which is not new.  He has not had any shortness of breath, chest pain, palpitations, headaches or numbness.  He denies weakness in his arms or legs.  He does have an appointment next month with neurology.  Moderate sleep apnea: He does have known moderate sleep apnea.  He did not tolerate CPAP and is currently not on any treatment.    Medications and allergies reviewed with patient and updated if appropriate.  Patient Active Problem List   Diagnosis Date Noted  . Migraine 05/15/2019  . Diverticulitis 02/28/2019  . BPPV (benign paroxysmal positional vertigo) 01/25/2016  . Essential hypertension 04/29/2015  . History of TIA (transient ischemic attack) 04/29/2015  . Hyperlipidemia 04/29/2015  . Diabetes ([place]) 04/29/2015  . Obese 05/17/2008  . OBSTRUCTIVE SLEEP APNEA 05/17/2008  . RESTLESS LEG SYNDROME 05/17/2008  . Coronary atherosclerosis 05/17/2008  . DIVERTICULITIS, COLON 05/17/2008  . Osteoarthritis 05/17/2008    Current Outpatient Medications on File Prior to Visit  Medication Sig Dispense Refill  . aspirin EC 81 MG tablet Take 1 tablet (81 mg total) by mouth daily. 30 tablet 0  . celecoxib (CELEBREX) 200 MG capsule Take 1 capsule (200 mg total) by mouth 2 (two) times daily. Follow=up appt due in Sept must  see provider for future refills 180 capsule 0  . lisinopril (PRINIVIL,ZESTRIL) 20 MG tablet Take 1 tablet (20 mg total) by mouth daily. 90 tablet 3  . nebivolol (BYSTOLIC) 10 MG tablet Take 1 tablet (10 mg total) by mouth at bedtime.    . rosuvastatin (CRESTOR) 10 MG tablet Take 1 tablet (10 mg total) by mouth daily. 90 tablet 1   No current facility-administered medications on file prior to visit.    Past Medical History:  Diagnosis Date  . Anxiety   . Arthritis   . Diverticula of colon   . TIA (transient  ischemic attack)    November 2013    Past Surgical History:  Procedure Laterality Date  . DUODENAL DIVERTICULECTOMY     2003  . KNEE ARTHROPLASTY Left     Social History   Socioeconomic History  . Marital status: Married    Spouse name: Not on file  . Number of children: Not on file  . Years of education: Not on file  . Highest education level: Not on file  Occupational History  . Not on file  Tobacco Use  . Smoking status: Former Smoker    Packs/day: 1.50    Years: 30.00    Pack years: 45.00    Quit date: 06/07/1996    Years since quitting: 22.9  . Smokeless tobacco: Never Used  Substance and Sexual Activity  . Alcohol use: Yes    Comment: ocassional   . Drug use: Not on file  . Sexual activity: Not on file  Other Topics Concern  . Not on file  Social History Narrative   No regular exercise.    Social Determinants of Health   Financial Resource Strain:   . Difficulty of Paying Living Expenses: Not on file  Food Insecurity:   . Worried About Charity fundraiser in the Last Year: Not on file  . Ran Out of Food in the Last Year: Not on file  Transportation Needs:   . Lack of Transportation (Medical): Not on file  . Lack of Transportation (Non-Medical): Not on file  Physical Activity:   . Days of Exercise per Week: Not on file  . Minutes of Exercise per Session: Not on file  Stress:   . Feeling of Stress : Not on file  Social Connections:   . Frequency of Communication with Friends and Family: Not on file  . Frequency of Social Gatherings with Friends and Family: Not on file  . Attends Religious Services: Not on file  . Active Member of Clubs or Organizations: Not on file  . Attends Archivist Meetings: Not on file  . Marital Status: Not on file    Family History  Problem Relation Age of Onset  . Transient ischemic attack Father     Review of Systems  Constitutional: Positive for fatigue. Negative for chills and fever.  Eyes: Negative for  visual disturbance.  Respiratory: Negative for cough, shortness of breath and wheezing.   Cardiovascular: Negative for chest pain, palpitations and leg swelling.  Neurological: Positive for light-headedness (occ, worse since the hospital). Negative for dizziness, weakness, numbness and headaches.       Objective:   Vitals:   05/21/19 1004  BP: (!) 146/84  Pulse: (!) 57  Resp: 18  Temp: 98.1 F (36.7 C)  SpO2: 95%   BP Readings from Last 3 Encounters:  05/21/19 (!) 146/84  05/15/19 (!) 120/53  02/28/19 (!) 154/92   Wt Readings from Last 3  Encounters:  05/21/19 287 lb (130.2 kg)  05/14/19 245 lb 6.4 oz (111.3 kg)  02/28/19 285 lb 12.8 oz (129.6 kg)   Body mass index is 35.87 kg/m.   Physical Exam    Constitutional: Appears well-developed and well-nourished. No distress.  HENT:  Head: Normocephalic and atraumatic.  Neck: Neck supple. No tracheal deviation present. No thyromegaly present.  No cervical lymphadenopathy Cardiovascular: Normal rate, regular rhythm and normal heart sounds.   No murmur heard. No carotid bruit .  No edema Pulmonary/Chest: Effort normal and breath sounds normal. No respiratory distress. No has no wheezes. No rales.  Skin: Skin is warm and dry. Not diaphoretic.  Psychiatric: Normal mood and affect. Behavior is normal.   Lab Results  Component Value Date   WBC 5.7 05/15/2019   HGB 14.7 05/15/2019   HCT 42.4 05/15/2019   PLT 186 05/15/2019   GLUCOSE 99 05/14/2019   CHOL 95 05/15/2019   TRIG 101 05/15/2019   HDL 30 (L) 05/15/2019   LDLCALC 45 05/15/2019   ALT 67 (H) 05/14/2019   AST 52 (H) 05/14/2019   NA 139 05/14/2019   K 4.1 05/14/2019   CL 103 05/14/2019   CREATININE 0.79 05/15/2019   BUN 14 05/14/2019   CO2 23 05/14/2019   TSH 1.18 08/28/2018   PSA 1.12 08/28/2018   INR 0.9 05/14/2019   HGBA1C 6.2 (H) 05/15/2019   MICROALBUR 0.8 04/08/2016   ECHOCARDIOGRAM COMPLETE   ECHOCARDIOGRAM REPORT       Patient Name:   OLIVA BERDAN  [facility], [place] Date of Exam: 05/15/2019 Medical Rec #:  [id number]         Height:       75.0 in Accession #:    [id number]        Weight:       245.4 lb Date of Birth:  [birth year]         BSA:          2.39 m Patient Age:    36 years          BP:           129/65 mmHg Patient Gender: M                 HR:           67 bpm. Exam Location:  Inpatient  Procedure: 2D Echo, Color Doppler and Cardiac Doppler  Indications:    TIA   History:        Patient has prior history of Echocardiogram examinations, most                 recent 04/26/2015. Risk Factors:Sleep Apnea, Hypertension,                 Diabetes and Dyslipidemia.   Sonographer:    Raquel Sarna Senior RDCS Referring Phys: [id number] [place]   1. Left ventricular ejection fraction, by visual estimation, is 60 to 65%. The left ventricle has normal function. There is mildly increased left ventricular hypertrophy.  2. Left ventricular diastolic parameters are consistent with Grade I diastolic dysfunction (impaired relaxation).  3. The left ventricle has no regional wall motion abnormalities.  4. Global right ventricle has normal systolic function.The right ventricular size is normal. No increase in right ventricular wall thickness.  5. Left atrial size was normal.  6. Right atrial size was normal.  7. Mild mitral annular calcification.  8. The mitral valve is normal in structure. No evidence of  mitral valve regurgitation. No evidence of mitral stenosis.  9. The tricuspid valve is normal in structure. Tricuspid valve regurgitation is not demonstrated. 10. The aortic valve is tricuspid. Aortic valve regurgitation is not visualized. Mild aortic valve sclerosis without stenosis. 11. TR signal is inadequate for assessing pulmonary artery systolic pressure. 12. The inferior vena cava is normal in size with greater than 50% respiratory variability, suggesting right atrial pressure of 3 mmHg.  FINDINGS  Left Ventricle: Left ventricular  ejection fraction, by visual estimation, is 60 to 65%. The left ventricle has normal function. The left ventricle has no regional wall motion abnormalities. The left ventricular internal cavity size was the left  ventricle is normal in size. There is mildly increased left ventricular hypertrophy. Left ventricular diastolic parameters are consistent with Grade I diastolic dysfunction (impaired relaxation).  Right Ventricle: The right ventricular size is normal. No increase in right ventricular wall thickness. Global RV systolic function is has normal systolic function.  Left Atrium: Left atrial size was normal in size.  Right Atrium: Right atrial size was normal in size  Pericardium: There is no evidence of pericardial effusion.  Mitral Valve: The mitral valve is normal in structure. Mild mitral annular calcification. No evidence of mitral valve stenosis by observation. No evidence of mitral valve regurgitation.  Tricuspid Valve: The tricuspid valve is normal in structure. Tricuspid valve regurgitation is not demonstrated.  Aortic Valve: The aortic valve is tricuspid. Aortic valve regurgitation is not visualized. Mild aortic valve sclerosis is present, with no evidence of aortic valve stenosis.  Pulmonic Valve: The pulmonic valve was normal in structure. Pulmonic valve regurgitation is not visualized.  Aorta: The aortic root is normal in size and structure.  Venous: The inferior vena cava is normal in size with greater than 50% respiratory variability, suggesting right atrial pressure of 3 mmHg.  IAS/Shunts: No atrial level shunt detected by color flow Doppler.    LEFT VENTRICLE PLAX 2D LVIDd:         4.70 cm  Diastology LVIDs:         2.40 cm  LV e' lateral:   10.80 cm/s LV PW:         1.50 cm  LV E/e' lateral: 8.0 LV IVS:        1.30 cm  LV e' medial:    8.38 cm/s LVOT diam:     2.00 cm  LV E/e' medial:  10.4 LV SV:         82 ml LV SV Index:   33.54 LVOT Area:     3.14 cm     RIGHT VENTRICLE RV S prime:     14.50 cm/s TAPSE (M-mode): 2.8 cm  LEFT ATRIUM             Index       RIGHT ATRIUM           Index LA diam:        4.60 cm 1.92 cm/m  RA Area:     18.00 cm LA Vol (A2C):   72.7 ml 30.37 ml/m RA Volume:   49.60 ml  20.72 ml/m LA Vol (A4C):   64.8 ml 27.07 ml/m LA Biplane Vol: 70.9 ml 29.62 ml/m  AORTIC VALVE LVOT Vmax:   111.00 cm/s LVOT Vmean:  76.900 cm/s LVOT VTI:    0.271 m   AORTA Ao Root diam: 3.10 cm Ao Asc diam:  2.90 cm  MITRAL VALVE MV Area (PHT): 2.80 cm  SHUNTS MV PHT:        78.59 msec            Systemic VTI:  0.27 m MV Decel Time: 271 msec              Systemic Diam: 2.00 cm MV E velocity: 86.90 cm/s  103 cm/s MV A velocity: 102.00 cm/s 70.3 cm/s MV E/A ratio:  0.85        1.5    Loralie Champagne MD Electronically signed by Loralie Champagne MD Signature Date/Time: 05/15/2019/5:23:51 PM      Final   VAS US CAROTID (at [facility] and [facility] only) Carotid Arterial Duplex Study  Indications:       TIA. Comparison Study:  04/26/15 at outside facility: Bilat 50-69% stenosis  Performing Technologist: June Leap RDMS, RVT    Examination Guidelines: A complete evaluation includes B-mode imaging, spectral Doppler, color Doppler, and power Doppler as needed of all accessible portions of each vessel. Bilateral testing is considered an integral part of a complete examination. Limited examinations for reoccurring indications may be performed as noted.    Right Carotid Findings: +----------+--------+--------+--------+-------------------------+--------+           PSV cm/sEDV cm/sStenosisPlaque Description       Comments +----------+--------+--------+--------+-------------------------+--------+ CCA Prox  155     22                                                +----------+--------+--------+--------+-------------------------+--------+ CCA Distal75      21                                                 +----------+--------+--------+--------+-------------------------+--------+ ICA Prox  136     39      40-59%  heterogenous and calcific         +----------+--------+--------+--------+-------------------------+--------+ ICA Distal112     21                                                +----------+--------+--------+--------+-------------------------+--------+ ECA       140     12                                                +----------+--------+--------+--------+-------------------------+--------+  +----------+--------+-------+----------------+-------------------+           PSV cm/sEDV cmsDescribe        Arm Pressure (mmHG) +----------+--------+-------+----------------+-------------------+ [id number]            Multiphasic, WNL                    +----------+--------+-------+----------------+-------------------+  +---------+--------+--+--------+--+---------+ VertebralPSV cm/s48EDV cm/s12Antegrade +---------+--------+--+--------+--+---------+     Left Carotid Findings: +----------+--------+--------+--------+------------------+--------+           PSV cm/sEDV cm/sStenosisPlaque DescriptionComments +----------+--------+--------+--------+------------------+--------+ CCA Prox  159     19                                         +----------+--------+--------+--------+------------------+--------+  CCA Distal79      15                                         +----------+--------+--------+--------+------------------+--------+ ICA Prox  80      25      1-39%   heterogenous               +----------+--------+--------+--------+------------------+--------+ ICA Distal130     32                                         +----------+--------+--------+--------+------------------+--------+ ECA       123     21                                          +----------+--------+--------+--------+------------------+--------+  +----------+--------+--------+----------------+-------------------+           PSV cm/sEDV cm/sDescribe        Arm Pressure (mmHG) +----------+--------+--------+----------------+-------------------+ Subclavian214             Multiphasic, WNL                    +----------+--------+--------+----------------+-------------------+  +---------+--------+--------+-------------------------+ VertebralPSV cm/sEDV cm/sAbsent and Not identified +---------+--------+--------+-------------------------+     Summary: Right Carotid: Velocities in the right ICA are consistent with a 40-59%                stenosis.  Left Carotid: Velocities in the left ICA are consistent with a 1-39% stenosis.    *See table(s) above for measurements and observations.    Electronically signed by Harold Barban MD on 05/15/2019 at 5:20:25 PM.      Final   MR BRAIN WO CONTRAST CLINICAL DATA:  TIA. Uncontrolled hypertension and seeing spots yesterday. Visual abnormalities have resolved currently.  EXAM: MRI HEAD WITHOUT CONTRAST  TECHNIQUE: Multiplanar, multiecho pulse sequences of the brain and surrounding structures were obtained without intravenous contrast.  COMPARISON:  01/09/2016  FINDINGS: Brain: No acute septae or subacute infarction, hemorrhage, hydrocephalus, extra-axial collection or mass lesion. No white matter disease or atrophy  Vascular: Normal flow voids. Strongly dominant right vertebral artery.  Skull and upper cervical spine: Normal marrow signal  Sinuses/Orbits: Small proteinaceous retention cysts in the maxillary sinuses. Negative orbits  IMPRESSION: Negative brain MRI. No infarct or other explanation for visual symptoms  Electronically Signed   By: Monte Fantasia M.D.   On: 05/15/2019 07:36 DG Chest 2 View CLINICAL DATA:  Shortness of breath, dizziness, chest pain.  EXAM: CHEST - 2  VIEW  COMPARISON:  01/09/2016.  04/26/2015.  01/30/2008.  FINDINGS: Mild prominence of the azygos vein noted, this is most likely secondary to semi-erect AP positioning. Mediastinum and hilar structures are otherwise unremarkable. Heart size normal. Mild bibasilar subsegmental atelectasis. Mild bibasilar infiltrates cannot be excluded. No pleural effusion or pneumothorax. No acute bony abnormality identified.  IMPRESSION: Low lung volumes with mild bibasilar subsegmental atelectasis. Mild bibasilar infiltrates cannot be excluded.  Electronically Signed   By: Marcello Moores  Register   On: 05/15/2019 07:35    Assessment & Plan:    Discussed the importance of regular exercise, which he is currently not doing.  Encouraged weight loss.  See Problem List for Assessment and  Plan of chronic medical problems.    This visit occurred during the SARS-CoV-2 public health emergency.  Safety protocols were in place, including screening questions prior to the visit, additional usage of staff PPE, and extensive cleaning of exam room while observing appropriate contact time as indicated for disinfecting solutions.   Follow-up in March as scheduled

## 2019-05-21 ENCOUNTER — Encounter: Payer: Self-pay | Admitting: Internal Medicine

## 2019-05-21 ENCOUNTER — Ambulatory Visit (INDEPENDENT_AMBULATORY_CARE_PROVIDER_SITE_OTHER): Payer: PPO | Admitting: Internal Medicine

## 2019-05-21 ENCOUNTER — Other Ambulatory Visit: Payer: Self-pay

## 2019-05-21 VITALS — BP 146/84 | HR 57 | Temp 98.1°F | Resp 18 | Ht 75.0 in | Wt 287.0 lb

## 2019-05-21 DIAGNOSIS — G4733 Obstructive sleep apnea (adult) (pediatric): Secondary | ICD-10-CM

## 2019-05-21 DIAGNOSIS — G43109 Migraine with aura, not intractable, without status migrainosus: Secondary | ICD-10-CM

## 2019-05-21 DIAGNOSIS — I10 Essential (primary) hypertension: Secondary | ICD-10-CM

## 2019-05-21 DIAGNOSIS — E7849 Other hyperlipidemia: Secondary | ICD-10-CM | POA: Diagnosis not present

## 2019-05-21 DIAGNOSIS — E119 Type 2 diabetes mellitus without complications: Secondary | ICD-10-CM

## 2019-05-21 MED ORDER — AMLODIPINE BESYLATE 5 MG PO TABS: 5.0000 mg | ORAL_TABLET | Freq: Every day | ORAL | 3 refills | Status: DC

## 2019-05-21 NOTE — Assessment & Plan Note (Signed)
Diet controlled-sugars have been in the prediabetic range

## 2019-05-21 NOTE — Assessment & Plan Note (Signed)
Blood pressure much better controlled since starting the Bystolic, but most likely this is the cause of his fatigue so we will discontinue it Continue lisinopril 20 mg daily Start amlodipine 5 mg daily He will monitor his blood pressure at home and let me know in a week or so what his readings are-blood pressure medication may need to be further adjusted Given history BP goal less than 130/80

## 2019-05-21 NOTE — Assessment & Plan Note (Signed)
Moderate sleep apnea Did not tolerate CPAP Will be seeing neurology next month cussed recent hospitalization, but also needs sleep apnea readdressed

## 2019-05-21 NOTE — Assessment & Plan Note (Signed)
Cholesterol has been well controlled Continue Crestor 10 mg daily

## 2019-05-21 NOTE — Assessment & Plan Note (Signed)
Neurology thought his symptoms that brought him to the ED were possible complicated migraine TIA is also possibility He will be following up with neurology as an outpatient early next month Discussed controlling risk factors for TIA/stroke since we will not know if this was a TIA for sure or not

## 2019-05-21 NOTE — Patient Instructions (Addendum)
   Medications reviewed and updated.  Changes include :   Stop the bystolic.  Start amlodipine 5 mg daily.   Your prescription(s) have been submitted to your pharmacy. Please take as directed and contact our office if you believe you are having problem(s) with the medication(s).   Please followup in march as scheduled.

## 2019-05-25 ENCOUNTER — Encounter: Payer: Self-pay | Admitting: Internal Medicine

## 2019-06-15 ENCOUNTER — Encounter: Payer: Self-pay | Admitting: Neurology

## 2019-06-15 ENCOUNTER — Ambulatory Visit: Payer: PPO | Admitting: Neurology

## 2019-06-15 ENCOUNTER — Other Ambulatory Visit: Payer: Self-pay

## 2019-06-15 VITALS — BP 146/78 | HR 74 | Temp 97.1°F | Ht 74.0 in | Wt 285.0 lb

## 2019-06-15 DIAGNOSIS — J342 Deviated nasal septum: Secondary | ICD-10-CM | POA: Diagnosis not present

## 2019-06-15 DIAGNOSIS — G2581 Restless legs syndrome: Secondary | ICD-10-CM | POA: Diagnosis not present

## 2019-06-15 DIAGNOSIS — G4733 Obstructive sleep apnea (adult) (pediatric): Secondary | ICD-10-CM

## 2019-06-15 DIAGNOSIS — Z8673 Personal history of transient ischemic attack (TIA), and cerebral infarction without residual deficits: Secondary | ICD-10-CM

## 2019-06-15 DIAGNOSIS — Z6836 Body mass index (BMI) 36.0-36.9, adult: Secondary | ICD-10-CM

## 2019-06-15 DIAGNOSIS — G43109 Migraine with aura, not intractable, without status migrainosus: Secondary | ICD-10-CM

## 2019-06-15 MED ORDER — BUTALBITAL-APAP-CAFFEINE 50-325-40 MG PO TABS: 1.0000 | ORAL_TABLET | Freq: Four times a day (QID) | ORAL | 3 refills | Status: DC | PRN

## 2019-06-15 NOTE — Patient Instructions (Signed)

## 2019-06-15 NOTE — Progress Notes (Signed)
Provider:  Larey Seat, M D  Referring Provider: [name], MD Primary Care Physician:  [name], MD  Chief Complaint  Patient presents with  . New Patient (Initial Visit)    pt with wife, rm 29. pt was placed in the hospital ? TIA or could have been a migraine or the medication.     HPI:  Sean Mullins is a 69 y.o. male seen here upon referral by  Dr. Quay Burow for TIA like symptoms.   Sean Mullins reports that he had TIA-like symptoms and recently had a hospital visit.  It was not the first time that he had what may have been a transient ischemic attack versus maybe a complicated migraine.  In 2017  He had ben seen for a TIA. In 2017 he had climbed a ladder at work and felt so overwhelmingly dizzy that he fell.  He has had on and off bouts of vertigo.  He describes a visual aura for slight bubbles popping up in his vision centrally and peripherally. Just this week on Monday he felt an nondrinker headache as he describes it this would have been 4 January.  Usually he does not have headaches and he was surprised to learn that he may have an ophthalmic or optic migraine.  Nausea is always part of these manifestations.  The presentation on 7 December at [facility] led to a CT of the head to rule out a stroke which returned normal he also had some small retention cyst in the maxillary sinuses I do not think that this would be causing any of the symptoms he presents here for.  After he presented to [facility] hospital on 14 May 2019 the following history was created.  Sean Mullins is a 69 year old right-handed Caucasian male with a history of possible TIA, coronary artery disease and diabetes mellitus, he was driving from [place] but he began experiencing dizziness and stated that as he got closer to the [facility] area his nausea increased he felt dizzy and flushed there was some tightness along his chest but the dizziness felt different.  He endorsed a  throbbing headache there was no weakness or dysarthria at that time no tingling but he felt disoriented or at least lightheaded upon arrival.  He did have a visual aura as described above.  I reviewed the patient's medications which include aspirin Celebrex Prinivil, Bystolic, Crestor and Antivert he also takes potassium supplements.  Carotid Doppler study in 2016 showed a 50 between 50 and 69% stenosis of the internal carotid artery on the right ventricle the left left vertebral artery showed high resistance flow normal ejection fraction and that EF 65% nonfocal exam.  Review of his labs from 8 December hospitalization showed normal CBC, normal comprehensive metabolic panel except for elevated AST and ALT these are mildly elevated MSA bilirubin total 1.5 and HDL was 38 which is his good cholesterol and is low.  He was asked to continue aspirin Plavix and atorvastatin, was given a migraine cocktail "" had a repeat echocardiogram and a new carotid ultrasound ordered.  MRI of the brain has been negative.  He was discharged after a repeat carotid artery duplex study from 15 May 2019 again bilaterally between 69 and 69% stenosis not flow-limiting.  His vital signs trended show bradycardia but regular his HbA1c was slightly elevated 6.2 which would be still good control for diabetic type II.  He retired at age 71, and works a couple of  hours a day to help out. He is a former smoker but has not smoked in over 20 years.  He has no history of alcohol abuse, caffeine is used all day in form of coffee. A pot a day means 6-8 standard cups.   Review of Systems: Out of a complete 14 system review, the patient complains of only the following symptoms, and all other reviewed systems are negative. fatigue.  Excessive daytime sleepiness. Snoring.  He had a sleep study with an AHI of over 10/h- had PLMs. Could not tolerate CPAP, has rhinitis.  How likely are you to doze in the following situations: 0 = not likely, 1 =  slight chance, 2 = moderate chance, 3 = high chance  Sitting and Reading? 3 Watching Television? 3 Sitting inactive in a public place (theater or meeting)? 3 Lying down in the afternoon when circumstances permit?3 Sitting and talking to someone?1 Sitting quietly after lunch without alcohol?3 In a car, while stopped for a few minutes in traffic?1 As a passenger in a car for an hour without a break? 1  Total = 17/ 24   No depression signs and symptoms endorsed.     Dr Georgie Chard notes from 2017 reviewed: Sean Mullins is a 69 year old right-handed male with type 2 diabetes, hypertension and hyperlipidemia who follows up for TIA.  UPDATE: He is still on ASA 81mg  daily and Crestor 10mg  daily.  He is doing well.  He changed his diet and has lost weight.  He walks 30 minutes every other day.  He feels great.  He has only had a couple of beers since last visit.  HISTORY in 2017: He was admitted to [facility] on 04/25/15 for TIA. He developed dizziness at around 2 pm. While driving home, he felt nauseated and developed numbness of the left arm and leg. He doesn't remember the rest of the drive home but EMS was called to his house and he was brought to the ED.speech was slurred.   Symptoms began to improve around 6 pm and were resolved by next morning. He was found to have left lower extremity weakness on evaluation in the ED. CT of brain showed no acute abnormality. He was unable to fit in the MRI scanner, so repeat CT in 24 hours was also unremarkable. CTA of the head was unremarkable for intracranial stenosis. Echo showed EF 60-65% with no cardiac source of embolus. Carotid doppler showed heterogeneous plaque at the bifurcation with peak velocities suggesting 50-69% stenosis but ICA/CCA ratio suggesting lesser degree of stenosis in both ICAs. Hgb A1c was 6.5%. LDL was 100 but recheck a couple of days later was 67. He was discharged on ASA 81mg  daily and Crestor 10mg  daily. He did have an  MRI of the brain on 05/27/15, which was unremarkable. MRA of the neck showed no hemodynamically significant ICA stenosis. Since the incident, he has made lifestyle changes. He eats more healthy. He walks 30 minutes a day. Blood pressure has improved, running usually in the 120-130s/70s.   Social History   Socioeconomic History  . Marital status: Married    Spouse name: Not on file  . Number of children: Not on file  . Years of education: Not on file  . Highest education level: Not on file  Occupational History  . Not on file  Tobacco Use  . Smoking status: Former Smoker    Packs/day: 1.50    Years: 30.00    Pack years: 45.00    Quit date:  06/07/1996    Years since quitting: 23.0  . Smokeless tobacco: Never Used  Substance and Sexual Activity  . Alcohol use: Yes    Comment: ocassional   . Drug use: Not on file  . Sexual activity: Not on file  Other Topics Concern  . Not on file  Social History Narrative   No regular exercise.    Social Determinants of Health   Financial Resource Strain:   . Difficulty of Paying Living Expenses: Not on file  Food Insecurity:   . Worried About Charity fundraiser in the Last Year: Not on file  . Ran Out of Food in the Last Year: Not on file  Transportation Needs:   . Lack of Transportation (Medical): Not on file  . Lack of Transportation (Non-Medical): Not on file  Physical Activity:   . Days of Exercise per Week: Not on file  . Minutes of Exercise per Session: Not on file  Stress:   . Feeling of Stress : Not on file  Social Connections:   . Frequency of Communication with Friends and Family: Not on file  . Frequency of Social Gatherings with Friends and Family: Not on file  . Attends Religious Services: Not on file  . Active Member of Clubs or Organizations: Not on file  . Attends Archivist Meetings: Not on file  . Marital Status: Not on file  Intimate Partner Violence:   . Fear of Current or Ex-Partner: Not on file    . Emotionally Abused: Not on file  . Physically Abused: Not on file  . Sexually Abused: Not on file    Family History  Problem Relation Age of Onset  . Transient ischemic attack Father     Past Medical History:  Diagnosis Date  . Anxiety   . Arthritis   . Diverticula of colon   . TIA (transient ischemic attack)    November 2013    Past Surgical History:  Procedure Laterality Date  . DUODENAL DIVERTICULECTOMY     2003  . KNEE ARTHROPLASTY Left     Current Outpatient Medications  Medication Sig Dispense Refill  . amLODipine (NORVASC) 5 MG tablet Take 1 tablet (5 mg total) by mouth daily. 90 tablet 3  . aspirin EC 81 MG tablet Take 1 tablet (81 mg total) by mouth daily. 30 tablet 0  . celecoxib (CELEBREX) 200 MG capsule Take 1 capsule (200 mg total) by mouth 2 (two) times daily. Follow=up appt due in Sept must see provider for future refills 180 capsule 0  . lisinopril (PRINIVIL,ZESTRIL) 20 MG tablet Take 1 tablet (20 mg total) by mouth daily. 90 tablet 3  . rosuvastatin (CRESTOR) 10 MG tablet Take 1 tablet (10 mg total) by mouth daily. 90 tablet 1   No current facility-administered medications for this visit.    Allergies as of 06/15/2019  . (No Known Allergies)    Vitals: BP (!) 146/78   Pulse 74   Temp (!) 97.1 F (36.2 C)   Ht 6\' 2"  (1.88 m)   Wt 285 lb (129.3 kg)   BMI 36.59 kg/m  Last Weight:  Wt Readings from Last 1 Encounters:  06/15/19 285 lb (129.3 kg)   Last Height:   Ht Readings from Last 1 Encounters:  06/15/19 6\' 2"  (1.88 m)    Physical exam:  General: The patient is awake, alert and appears not in acute distress. The patient is well groomed. Head: Normocephalic, atraumatic. Neck is supple. Mallampati-4  ,  neck circumference:19, complete basal congestion.  Cardiovascular:  Regular rate and rhythm: , without  murmurs or carotid bruit, and without distended neck veins. Respiratory: Lungs are clear to auscultation. Skin:  Without evidence of  edema, or rash Trunk: BMI is 36,  elevated , normal posture.  Neurologic exam : The patient is awake and alert, oriented to place and time.  Memory subjective  described as intact. There is a normal attention span & concentration ability. Speech is fluent without dysarthria, dysphonia or aphasia.  Mood and affect are appropriate.  Cranial nerves: Pupils are equal and briskly reactive to light. Funduscopic exam without  evidence of pallor or edema. Extraocular movements  in vertical and horizontal planes intact and without nystagmus.  Visual fields by finger perimetry are intact. Hearing to finger rub intact.  Facial sensation intact to fine touch. Facial motor strength is symmetric and tongue and uvula move midline. Tongue protrusion into either cheek is normal. Shoulder shrug is normal.   Motor exam:  Normal tone ,muscle bulk and symmetric  strength in all extremities. Sensory:  Fine touch, pinprick and vibration were tested in all extremities. Proprioception was normal. Coordination: Rapid alternating movements in the fingers/hands were normal. Finger-to-nose maneuver  normal without evidence of ataxia, dysmetria or tremor.  Gait and station: Patient walks without assistive device and is able unassisted to climb up to the exam table. Strength within normal limits. Stance is stable and normal. Tandem gait is unfragmented. Romberg testing is negative   Deep tendon reflexes: in the upper and lower extremities are symmetric and intact. Babinski maneuver response deferred.   Assessment:  After physical and neurologic examination, review of laboratory studies, imaging, neurophysiology testing and pre-existing records, assessment is that of :   Optic migraine- possible triggers are untreated OSA, caffeine overuse and diabetes.   TIA 4 years ago- was associated with amnesia and hemi-numbness of the body. No slurring of speech this time .   Plan:  Treatment plan and additional workup  :  Attended sleep study for OSA, hypoxemia and for PLMs.  Reduce caffeine !! Restless legs - check ferritin, TIBC.  Start a medication-  Foricet for PRN use.  ENT referral for septal deviation needed.   Asencion Partridge Alsha Meland MD 06/15/2019

## 2019-06-16 LAB — IRON,TIBC AND FERRITIN PANEL
Ferritin: 693 ng/mL — ABNORMAL HIGH (ref 30–400)
Iron Saturation: 51 % (ref 15–55)
Iron: 154 ug/dL (ref 38–169)
Total Iron Binding Capacity: 304 ug/dL (ref 250–450)
UIBC: 150 ug/dL (ref 111–343)

## 2019-06-18 ENCOUNTER — Telehealth: Payer: Self-pay | Admitting: Neurology

## 2019-06-18 NOTE — Telephone Encounter (Signed)
Called the patient and reviewed his lab results with him. Pt verbalized understanding. Pt had no questions at this time but was encouraged to call back if questions arise.

## 2019-06-18 NOTE — Progress Notes (Signed)
High ferritin, not iron deficient.

## 2019-06-26 ENCOUNTER — Encounter: Payer: Self-pay | Admitting: Internal Medicine

## 2019-06-28 DIAGNOSIS — G4733 Obstructive sleep apnea (adult) (pediatric): Secondary | ICD-10-CM | POA: Diagnosis not present

## 2019-07-10 ENCOUNTER — Other Ambulatory Visit: Payer: Self-pay | Admitting: Internal Medicine

## 2019-08-05 ENCOUNTER — Ambulatory Visit (INDEPENDENT_AMBULATORY_CARE_PROVIDER_SITE_OTHER): Payer: PPO | Admitting: Neurology

## 2019-08-05 DIAGNOSIS — G4731 Primary central sleep apnea: Secondary | ICD-10-CM

## 2019-08-05 DIAGNOSIS — G4733 Obstructive sleep apnea (adult) (pediatric): Secondary | ICD-10-CM

## 2019-08-05 DIAGNOSIS — G4730 Sleep apnea, unspecified: Secondary | ICD-10-CM

## 2019-08-05 DIAGNOSIS — Z8673 Personal history of transient ischemic attack (TIA), and cerebral infarction without residual deficits: Secondary | ICD-10-CM

## 2019-08-05 DIAGNOSIS — G2581 Restless legs syndrome: Secondary | ICD-10-CM

## 2019-08-05 DIAGNOSIS — Z6836 Body mass index (BMI) 36.0-36.9, adult: Secondary | ICD-10-CM

## 2019-08-05 DIAGNOSIS — G43109 Migraine with aura, not intractable, without status migrainosus: Secondary | ICD-10-CM

## 2019-08-05 DIAGNOSIS — J342 Deviated nasal septum: Secondary | ICD-10-CM

## 2019-08-16 DIAGNOSIS — G4733 Obstructive sleep apnea (adult) (pediatric): Secondary | ICD-10-CM | POA: Insufficient documentation

## 2019-08-16 DIAGNOSIS — G4731 Primary central sleep apnea: Secondary | ICD-10-CM | POA: Insufficient documentation

## 2019-08-16 NOTE — Addendum Note (Signed)
Addended by: Larey Seat on: 08/16/2019 06:15 PM   Modules accepted: Orders

## 2019-08-16 NOTE — Progress Notes (Signed)
The baseline 02 saturation was 90%, with the lowest being 73%. Time spent below 89% saturation equaled 139 minutes.  The arousals were noted as: 78 were spontaneous, 37 were associated with PLMs, 224 were associated with respiratory events.IMPRESSION:  1. Severe Complex Sleep Apnea with mostly central events.  2. Severe Periodic Limb Movement Disorder (PLMD) 3. Snoring 4. Clinically relevant prolonged sleep hypoxemia.    RECOMMENDATIONS:  1. Advise full- night, attended, PAP titration study to optimize therapy.

## 2019-08-16 NOTE — Procedures (Signed)
PATIENT'S NAME:  Sean, Mullins DOB:      03/21/1951      MR#:    [id number]     DATE OF RECORDING: 08/05/2019  [facility] REFERRING M.D.:  Billey Gosling, MD Study Performed:   Baseline Polysomnogram HISTORY:  Sean Mullins is a 69 y.o. male seen upon referral by Dr. Quay Burow for TIA like symptoms.    Mr. Sean Mullins reports that he had TIA-like symptoms recently and had a hospital visit. It was not the first time that he had what may have been a transient ischemic attack versus maybe a complicated migraine.  In 2017 he had been worked up for a TIA. He had climbed a ladder at work and felt so overwhelmingly dizzy that he fell.  He has had on and off bouts of vertigo.  He describes a visual aura for slight "bubbles" popping up in his vision centrally and peripherally and had a headache.  Usually he does not have headaches and he was surprised to learn that he may have an ophthalmic or optic migraine.  Nausea is always part of these manifestations.  The presentation on 7 December at [facility] led to a CT of the head to rule out a stroke which returned normal he also had some small retention cyst in the maxillary sinuses- I do not think that this would be causing any of the symptoms he presents here for.  After he presented to [facility] long hospital on 14 May 2019 the following history was created.  Sean Mullins is a right-handed Caucasian male with a history of possible TIA, coronary artery disease and diabetes mellitus, he was driving from [place] but he began experiencing dizziness and stated that as he got closer to the [facility] area his nausea increased- he felt dizzy and flushed. He endorsed a throbbing headache there was no weakness or dysarthria at that time, no tingling, but he felt disoriented or at least lightheaded upon arrival.  He did have a visual aura as described above  The patient endorsed the Epworth Sleepiness Scale at 17/24 points.   The patient's weight 285 pounds with a  height of 74 (inches), resulting in a BMI of 36.5 kg/m2. The patient's neck circumference measured 19 inches.  CURRENT MEDICATIONS: Norvasc, Aspirin, Celebrex, Prinivil, Crestor.   PROCEDURE:  This is a multichannel digital polysomnogram utilizing the Somnostar 11.2 system.  Electrodes and sensors were applied and monitored per AASM Specifications.   EEG, EOG, Chin and Limb EMG, were sampled at 200 Hz.  ECG, Snore and Nasal Pressure, Thermal Airflow, Respiratory Effort, CPAP Flow and Pressure, Oximetry was sampled at 50 Hz. Digital video and audio were recorded.      BASELINE STUDY: Lights Out was at 21:31 and Lights On at 04:59.  Total recording time (TRT) was 448 minutes, with a total sleep time (TST) of 362.5 minutes.   The patient's sleep latency was 17 minutes.  REM latency was 211 minutes.  The sleep efficiency was 80.9 %.     SLEEP ARCHITECTURE: WASO (Wake after sleep onset) was 81.5 minutes.  There were 15.5 minutes in Stage N1, 292 minutes Stage N2, 0 minutes Stage N3 and 55 minutes in Stage REM.  The percentage of Stage N1 was 4.3%, Stage N2 was 80.6%, Stage N3 was 0% and Stage R (REM sleep) was 15.2%.     RESPIRATORY ANALYSIS:  There were a total of 317 respiratory events:  103 obstructive apneas, 107 central apneas and 0 mixed apneas with  a total of 210 apneas and an apnea index (AI) of 34.8 /hour. There were 107 hypopneas with a hypopnea index of 17.7 /hour.  The total APNEA/HYPOPNEA INDEX (AHI) was 52.5/hour.  54 events occurred in REM sleep and 262 events in NREM. The REM AHI was  58.9 /hour, versus a non-REM AHI of 51.3. The patient spent 150.5 minutes of total sleep time in the supine position and 212 minutes in non-supine.. The supine AHI was 60.2 versus a non-supine AHI of 47.0.  OXYGEN SATURATION & C02:  The Wake baseline 02 saturation was 90%, with the lowest being 73%. Time spent below 89% saturation equaled 139 minutes.  The arousals were noted as: 78 were spontaneous, 37  were associated with PLMs, 224 were associated with respiratory events. The Periodic Limb Movement (PLM) index was 28. and the PLM Arousal index was 6.1/hour. Audio and video analysis did not show any abnormal or unusual movements, behaviors, phonations or vocalizations.  Snoring was noted. EKG was in keeping with normal sinus rhythm (NSR).  IMPRESSION:  1. Severe Complex Sleep Apnea with mostly central events.  2. Severe  Periodic Limb Movement Disorder (PLMD) 3. Snoring 4. Clinically relevant prolonged sleep hypoxemia.    RECOMMENDATIONS:  1. Advise full-night, attended, PAP titration study to optimize therapy.    I certify that I have reviewed the entire raw data recording prior to the issuance of this report in accordance with the Standards of Accreditation of the American Academy of Sleep Medicine (AASM)   Larey Seat, MD Diplomat, American Board of Psychiatry and Neurology  Diplomat, American Board of Sleep Medicine Market researcher, [place] [facility] [employer]

## 2019-08-20 ENCOUNTER — Telehealth: Payer: Self-pay | Admitting: Neurology

## 2019-08-20 NOTE — Telephone Encounter (Signed)

## 2019-08-20 NOTE — Telephone Encounter (Signed)
-----   Message from Larey Seat, MD sent at 08/16/2019  6:14 PM EST ----- The baseline 02 saturation was 90%, with the lowest being 73%. Time spent below 89% saturation equaled 139 minutes.  The arousals were noted as: 78 were spontaneous, 37 were associated with PLMs, 224 were associated with respiratory events.IMPRESSION:  1. Severe Complex Sleep Apnea with mostly central events.  2. Severe Periodic Limb Movement Disorder (PLMD) 3. Snoring 4. Clinically relevant prolonged sleep hypoxemia.    RECOMMENDATIONS:  1. Advise full- night, attended, PAP titration study to optimize therapy.

## 2019-08-27 NOTE — Patient Instructions (Addendum)
  Blood work was ordered.     Pneumonia immunization administered today.     Medications reviewed and updated.  Changes include :   Start Requip in the evening - we will increase this as needed  Your prescription(s) have been submitted to your pharmacy. Please take as directed and contact our office if you believe you are having problem(s) with the medication(s).     Please followup in 6 months

## 2019-08-27 NOTE — Progress Notes (Signed)
Subjective:    Patient ID: Sean Mullins, male    DOB: Jun 08, 1950, 69 y.o.   MRN: [id number]  HPI The patient is here for follow up of their chronic medical problems, including diabetes, hypertension, hyperlipidemia, CAD, h/o TIA  He is taking all of his medications as prescribed.     He is exercising regularly.   He started walking a little.  He feels he is compliant with a low sugar/carb diet.    He has a history of RLS and was on Requip years ago.  He would like to restart it.     Medications and allergies reviewed with patient and updated if appropriate.  Patient Active Problem List   Diagnosis Date Noted  . Complex sleep apnea syndrome 08/16/2019  . Chronic intermittent hypoxia with obstructive sleep apnea 08/16/2019  . Migraine 05/15/2019  . Diverticulitis 02/28/2019  . BPPV (benign paroxysmal positional vertigo) 01/25/2016  . Essential hypertension 04/29/2015  . History of TIA (transient ischemic attack) 04/29/2015  . Hyperlipidemia 04/29/2015  . Diabetes ([place]) 04/29/2015  . Obese 05/17/2008  . OBSTRUCTIVE SLEEP APNEA, severe 05/17/2008  . RESTLESS LEG SYNDROME 05/17/2008  . Coronary atherosclerosis 05/17/2008  . Osteoarthritis 05/17/2008    Current Outpatient Medications on File Prior to Visit  Medication Sig Dispense Refill  . amLODipine (NORVASC) 5 MG tablet Take 1 tablet (5 mg total) by mouth daily. 90 tablet 3  . aspirin EC 81 MG tablet Take 1 tablet (81 mg total) by mouth daily. 30 tablet 0  . butalbital-acetaminophen-caffeine (FIORICET) 50-325-40 MG tablet Take 1 tablet by mouth every 6 (six) hours as needed for headache. 10 tablet 3  . celecoxib (CELEBREX) 200 MG capsule Take 1 capsule (200 mg total) by mouth 2 (two) times daily. Follow=up appt due in Sept must see provider for future refills 180 capsule 0  . lisinopril (ZESTRIL) 20 MG tablet TAKE 1 TABLET BY MOUTH ONCE DAILY 90 tablet 0  . rosuvastatin (CRESTOR) 10 MG tablet TAKE 1 TABLET BY MOUTH  ONCE DAILY 90 tablet 0   No current facility-administered medications on file prior to visit.    Past Medical History:  Diagnosis Date  . Anxiety   . Arthritis   . Diverticula of colon   . TIA (transient ischemic attack)    November 2013    Past Surgical History:  Procedure Laterality Date  . DUODENAL DIVERTICULECTOMY     2003  . KNEE ARTHROPLASTY Left     Social History   Socioeconomic History  . Marital status: Married    Spouse name: Not on file  . Number of children: Not on file  . Years of education: Not on file  . Highest education level: Not on file  Occupational History  . Not on file  Tobacco Use  . Smoking status: Former Smoker    Packs/day: 1.50    Years: 30.00    Pack years: 45.00    Quit date: 06/07/1996    Years since quitting: 23.2  . Smokeless tobacco: Never Used  Substance and Sexual Activity  . Alcohol use: Yes    Comment: ocassional   . Drug use: Not on file  . Sexual activity: Not on file  Other Topics Concern  . Not on file  Social History Narrative   No regular exercise.    Social Determinants of Health   Financial Resource Strain:   . Difficulty of Paying Living Expenses:   Food Insecurity:   . Worried About Estate manager/land agent  of Food in the Last Year:   . [place] in the Last Year:   Transportation Needs:   . Lack of Transportation (Medical):   Marland Kitchen Lack of Transportation (Non-Medical):   Physical Activity:   . Days of Exercise per Week:   . Minutes of Exercise per Session:   Stress:   . Feeling of Stress :   Social Connections:   . Frequency of Communication with Friends and Family:   . Frequency of Social Gatherings with Friends and Family:   . Attends Religious Services:   . Active Member of Clubs or Organizations:   . Attends Archivist Meetings:   Marland Kitchen Marital Status:     Family History  Problem Relation Age of Onset  . Transient ischemic attack Father     Review of Systems  Constitutional: Negative for  chills and fever.  Respiratory: Positive for cough (? from lisinopril - dry). Negative for shortness of breath and wheezing.   Cardiovascular: Negative for chest pain, palpitations and leg swelling.  Neurological: Positive for headaches (occ, mild). Negative for light-headedness and numbness.       Objective:   Vitals:   08/28/19 0802  BP: 128/72  Pulse: 61  Resp: 18  Temp: 98 F (36.7 C)  SpO2: 94%   BP Readings from Last 3 Encounters:  08/28/19 128/72  06/15/19 (!) 146/78  05/21/19 (!) 146/84   Wt Readings from Last 3 Encounters:  08/28/19 285 lb (129.3 kg)  06/15/19 285 lb (129.3 kg)  05/21/19 287 lb (130.2 kg)   Body mass index is 36.59 kg/m.   Physical Exam    Constitutional: Appears well-developed and well-nourished. No distress.  HENT:  Head: Normocephalic and atraumatic.  Neck: Neck supple. No tracheal deviation present. No thyromegaly present.  No cervical lymphadenopathy Cardiovascular: Normal rate, regular rhythm and normal heart sounds.   No murmur heard. No carotid bruit .  No edema Pulmonary/Chest: Effort normal and breath sounds normal. No respiratory distress. No has no wheezes. No rales.  Skin: Skin is warm and dry. Not diaphoretic.  Psychiatric: Normal mood and affect. Behavior is normal.      Assessment & Plan:    See Problem List for Assessment and Plan of chronic medical problems.    This visit occurred during the SARS-CoV-2 public health emergency.  Safety protocols were in place, including screening questions prior to the visit, additional usage of staff PPE, and extensive cleaning of exam room while observing appropriate contact time as indicated for disinfecting solutions.

## 2019-08-28 ENCOUNTER — Encounter: Payer: Self-pay | Admitting: Neurology

## 2019-08-28 ENCOUNTER — Other Ambulatory Visit: Payer: Self-pay

## 2019-08-28 ENCOUNTER — Ambulatory Visit (INDEPENDENT_AMBULATORY_CARE_PROVIDER_SITE_OTHER): Payer: PPO | Admitting: Internal Medicine

## 2019-08-28 ENCOUNTER — Encounter: Payer: Self-pay | Admitting: Internal Medicine

## 2019-08-28 VITALS — BP 128/72 | HR 61 | Temp 98.0°F | Resp 18 | Ht 74.0 in | Wt 285.0 lb

## 2019-08-28 DIAGNOSIS — I10 Essential (primary) hypertension: Secondary | ICD-10-CM

## 2019-08-28 DIAGNOSIS — Z6836 Body mass index (BMI) 36.0-36.9, adult: Secondary | ICD-10-CM | POA: Diagnosis not present

## 2019-08-28 DIAGNOSIS — G2581 Restless legs syndrome: Secondary | ICD-10-CM | POA: Diagnosis not present

## 2019-08-28 DIAGNOSIS — E7849 Other hyperlipidemia: Secondary | ICD-10-CM

## 2019-08-28 DIAGNOSIS — E119 Type 2 diabetes mellitus without complications: Secondary | ICD-10-CM

## 2019-08-28 DIAGNOSIS — Z8673 Personal history of transient ischemic attack (TIA), and cerebral infarction without residual deficits: Secondary | ICD-10-CM

## 2019-08-28 DIAGNOSIS — Z23 Encounter for immunization: Secondary | ICD-10-CM

## 2019-08-28 LAB — COMPREHENSIVE METABOLIC PANEL
ALT: 48 U/L (ref 0–53)
AST: 30 U/L (ref 0–37)
Albumin: 4.3 g/dL (ref 3.5–5.2)
Alkaline Phosphatase: 61 U/L (ref 39–117)
BUN: 14 mg/dL (ref 6–23)
CO2: 28 mEq/L (ref 19–32)
Calcium: 9.9 mg/dL (ref 8.4–10.5)
Chloride: 105 mEq/L (ref 96–112)
Creatinine, Ser: 0.82 mg/dL (ref 0.40–1.50)
GFR: 93.85 mL/min (ref >60.00)
Glucose, Bld: 125 mg/dL — ABNORMAL HIGH (ref 70–99)
Potassium: 4.3 mEq/L (ref 3.5–5.1)
Sodium: 139 mEq/L (ref 135–145)
Total Bilirubin: 1.2 mg/dL (ref 0.2–1.2)
Total Protein: 7.1 g/dL (ref 6.0–8.3)

## 2019-08-28 LAB — LIPID PANEL
Cholesterol: 111 mg/dL (ref 0–200)
HDL: 34.1 mg/dL — ABNORMAL LOW (ref >39.00)
LDL Cholesterol: 40 mg/dL (ref 0–99)
NonHDL: 76.75
Total CHOL/HDL Ratio: 3
Triglycerides: 185 mg/dL — ABNORMAL HIGH (ref 0.0–149.0)
VLDL: 37 mg/dL (ref 0.0–40.0)

## 2019-08-28 LAB — HEMOGLOBIN A1C: Hgb A1c MFr Bld: 6 % (ref 4.6–6.5)

## 2019-08-28 MED ORDER — ROPINIROLE HCL 0.25 MG PO TABS: 0.2500 mg | ORAL_TABLET | Freq: Every day | ORAL | 5 refills | Status: DC

## 2019-08-28 NOTE — Assessment & Plan Note (Signed)
Chronic Check lipid panel  Continue daily statin Regular exercise and healthy diet encouraged  

## 2019-08-28 NOTE — Assessment & Plan Note (Signed)
Chronic Was on requip in the past and would like to restart it Restart requip in the evening - 1 pill in evening x 2 days then increase to 0.5 mg daily Will titrate as needed

## 2019-08-28 NOTE — Assessment & Plan Note (Addendum)
Chronic, h/o of TIA BP well controlled Check lipids Continue ASA 81 mg and statin

## 2019-08-28 NOTE — Assessment & Plan Note (Signed)
Chronic Stressed weight loss Advised increasing exercising Decrease portions F/u in 6 months

## 2019-08-28 NOTE — Assessment & Plan Note (Signed)
Chronic BP well controlled Current regimen effective and well tolerated Continue current medications at current doses cmp  

## 2019-08-28 NOTE — Assessment & Plan Note (Signed)
Chronic Diet controlled Check a1c Low sugar / carb diet Stressed regular exercise Advised weight loss

## 2019-08-29 ENCOUNTER — Encounter: Payer: Self-pay | Admitting: Internal Medicine

## 2019-09-03 ENCOUNTER — Other Ambulatory Visit (HOSPITAL_COMMUNITY)
Admission: RE | Admit: 2019-09-03 | Discharge: 2019-09-03 | Disposition: A | Discharge status: Home / Self Care | Payer: PPO | Source: Ambulatory Visit | Attending: Neurology | Admitting: Neurology

## 2019-09-03 DIAGNOSIS — Z20822 Contact with and (suspected) exposure to covid-19: Secondary | ICD-10-CM | POA: Diagnosis not present

## 2019-09-03 DIAGNOSIS — Z01812 Encounter for preprocedural laboratory examination: Secondary | ICD-10-CM | POA: Diagnosis not present

## 2019-09-03 LAB — SARS CORONAVIRUS 2 (TAT 6-24 HRS): SARS Coronavirus 2: NEGATIVE

## 2019-09-05 ENCOUNTER — Other Ambulatory Visit: Payer: Self-pay

## 2019-09-05 ENCOUNTER — Ambulatory Visit (INDEPENDENT_AMBULATORY_CARE_PROVIDER_SITE_OTHER): Payer: PPO | Admitting: Neurology

## 2019-09-05 DIAGNOSIS — G4731 Primary central sleep apnea: Secondary | ICD-10-CM

## 2019-09-05 DIAGNOSIS — G4730 Sleep apnea, unspecified: Secondary | ICD-10-CM

## 2019-09-05 DIAGNOSIS — G4734 Idiopathic sleep related nonobstructive alveolar hypoventilation: Secondary | ICD-10-CM

## 2019-09-05 DIAGNOSIS — G4733 Obstructive sleep apnea (adult) (pediatric): Secondary | ICD-10-CM

## 2019-09-05 DIAGNOSIS — G43109 Migraine with aura, not intractable, without status migrainosus: Secondary | ICD-10-CM

## 2019-09-13 ENCOUNTER — Telehealth: Payer: Self-pay | Admitting: Neurology

## 2019-09-13 ENCOUNTER — Ambulatory Visit: Payer: PPO | Admitting: Neurology

## 2019-09-13 DIAGNOSIS — G4730 Sleep apnea, unspecified: Secondary | ICD-10-CM | POA: Insufficient documentation

## 2019-09-13 NOTE — Progress Notes (Signed)
DIAGNOSIS Complex sleep apnea, PLMs and Hypoxemia were responsive and alleviated under CPAP at 11 cm water.    PLANS/RECOMMENDATIONS: The patient was fitted with a Medium sized F and P Simplus FFM mask. He will use an autotitration capable device for CPAP with 5 through 15 cm water pressure and heated humidity.

## 2019-09-13 NOTE — Telephone Encounter (Signed)
-----   Message from Larey Seat, MD sent at 09/13/2019  5:01 PM EDT ----- DIAGNOSIS Complex sleep apnea, PLMs and Hypoxemia were responsive and alleviated under CPAP at 11 cm water.    PLANS/RECOMMENDATIONS: The patient was fitted with a Medium sized F and P Simplus FFM mask. He will use an autotitration capable device for CPAP with 5 through 15 cm water pressure and heated humidity.

## 2019-09-13 NOTE — Telephone Encounter (Signed)
I called pt. I advised pt that Dr. Brett Fairy reviewed their sleep study results and found that pt has severe sleep apnea that was best treated with CPAP. Dr. Brett Fairy recommends that pt starts auto CPAP. I reviewed PAP compliance expectations with the pt. Pt is agreeable to starting a CPAP. I advised pt that an order will be sent to a DME, [facility], and [facility] will call the pt within about one week after they file with the pt's insurance. [facility] will show the pt how to use the machine, fit for masks, and troubleshoot the CPAP if needed. A follow up appt will Dohmeier or the NP within 31-90 days after starting the machine. At this time the patient didn't want to set that appointment up because he didn't know if he would be able to afford the machine.  A letter with all of this information in it will be mailed to the pt as a reminder. I verified with the pt that the address we have on file is correct. Pt verbalized understanding of results. Pt had no questions at this time but was encouraged to call back if questions arise. I have sent the order to [facility] and have received confirmation that they have received the order.  Pt was concerned with why this even became a concern and I spent a significant amount of time educating the patient on how untreated sleep apnea can affect the body. He was appreciative for that.

## 2019-09-13 NOTE — Addendum Note (Signed)
Addended by: Larey Seat on: 09/13/2019 05:01 PM   Modules accepted: Orders

## 2019-09-13 NOTE — Procedures (Signed)
PATIENT'S NAME:  Sean Mullins, Sean Mullins DOB:      29-May-1951      MR#:    [id number]     DATE OF RECORDING: 09/05/2019 MR REFERRING M.D.:  Sean Gosling, MD/ Sean Mullins is primary Neurologist.  Study Performed:   CPAP  Titration HISTORY:  Sean Mullins is a 69 year-old right-handed Caucasian male with a history of possible TIA, coronary artery disease and diabetes mellitus, returning for a CPAP titration:  The preceeding diagnostic polysomnogram performed on 08/05/2019 revealed: Severe Complex Sleep Apnea with mostly central events.  Severe Periodic Limb Movement Disorder (PLMD) Snoring and Clinically relevant prolonged sleep hypoxemia for 139 minutes.  The patient endorsed the Epworth Sleepiness Scale at 17/24 points.   The patient's weight 285 pounds with a height of 74 (inches), resulting in a BMI of 36.5 kg/m2. The patient's neck circumference measured 19 inches.   CURRENT MEDICATIONS: Norvasc, Aspirin, Celebrex, Prinivil, Crestor.   PROCEDURE:  This is a multichannel digital polysomnogram utilizing the SomnoStar 11.2 system.  Electrodes and sensors were applied and monitored per AASM Specifications.   EEG, EOG, Chin and Limb EMG, were sampled at 200 Hz.  ECG, Snore and Nasal Pressure, Thermal Airflow, Respiratory Effort, CPAP Flow and Pressure, Oximetry was sampled at 50 Hz. Digital video and audio were recorded.      The patient was fitted with a Medium sized F and P Simplus FFM mask. CPAP was initiated at 5 cmH20 with heated humidity per AASM split night standards and pressure was advanced to 11cmH20 because of hypopneas, apneas and desaturations.  At a PAP pressure of 11 cmH20, there was a reduction of the AHI to 0.0 with improvement of sleep apnea.  Lights Out was at 22:07 and Lights On at 04:13. Total recording time (TRT) was 366.5 minutes, with a total sleep time (TST) of 314 minutes. The patient's sleep latency was 9.5 minutes. REM latency was 38.5 minutes.  The sleep efficiency was  85.7 %.    SLEEP ARCHITECTURE: WASO (Wake after sleep onset) was 45.5 minutes.  There were 18 minutes in Stage N1, 208.5 minutes Stage N2, 7 minutes Stage N3 and 80.5 minutes in Stage REM.  The percentage of Stage N1 was 5.7%, Stage N2 was 66.4%, Stage N3 was 2.2% and Stage R (REM sleep) was 25.6%.   RESPIRATORY ANALYSIS:  There was a total of 54 respiratory events: 8 obstructive apneas, 6 central apneas and 3 mixed apneas with a total of 17 apneas and an apnea index (AHI) of 3.2 /hour. There were 37 hypopneas with a hypopnea index of 7.1/hour. The patient also had 1 respiratory event related arousal (RERAs).     The total APNEA/HYPOPNEA INDEX  (AHI) was 10.3 /hour and the total RESPIRATORY DISTURBANCE INDEX was 10.5 /hour .8 events occurred in REM sleep and 46 events in NREM. The REM AHI was 6. /hour versus a non-REM AHI of 11.8 /hour.  The patient spent 230.5 minutes of total sleep time in the supine position and 84 minutes in non-supine. The supine AHI was 14.0/h, versus a non-supine AHI of 0.0.  OXYGEN SATURATION & C02:  The baseline 02 saturation was 93%, with the lowest being 84%. Time spent below 89% saturation equaled 3 minutes.  PERIODIC LIMB MOVEMENTS:  The patient had a total of 117 Periodic Limb Movements. The Periodic Limb Movement (PLM) index was 22.4 and the PLM Arousal index was 1.1 /hour. PLMs ceased after 1 AM with increasing CPAP pressure.  The arousals were  noted as: 26 were spontaneous, 6 were associated with PLMs, 34 were associated with respiratory events.  Audio and video analysis did not show any abnormal or unusual movements, behaviors, phonations or vocalizations.   EKG was in keeping with normal sinus rhythm.   DIAGNOSIS Complex sleep apnea, PLMs and Hypoxemia were responsive and alleviated under CPAP    PLANS/RECOMMENDATIONS: The patient was fitted with a Medium sized F and P Simplus FFM mask. He will use an autotitration capable device for CPAP with 5 through 15 cm  water pressure and heated humidity.    DISCUSSION:A follow up appointment will be scheduled in the Sleep Clinic at [facility].   Please call [phone] with any questions.      I certify that I have reviewed the entire raw data recording prior to the issuance of this report in accordance with the Standards of Accreditation of the American Academy of Sleep Medicine (AASM)      Sean Mullins, M.D. Diplomat, Tax adviser of Psychiatry and Neurology  Diplomat, Tax adviser of Sleep Medicine Market researcher, [employer] [facility] at [employer]

## 2019-09-24 DIAGNOSIS — G4733 Obstructive sleep apnea (adult) (pediatric): Secondary | ICD-10-CM | POA: Diagnosis not present

## 2019-10-13 ENCOUNTER — Other Ambulatory Visit: Payer: Self-pay | Admitting: Internal Medicine

## 2019-10-18 ENCOUNTER — Telehealth: Payer: Self-pay | Admitting: Internal Medicine

## 2019-10-18 NOTE — Progress Notes (Signed)
  Chronic Care Management   Outreach Note  10/18/2019 Name: Sean Mullins MRN: [id number] DOB: Nov 26, 1950  Referred by: [name], MD Reason for referral : No chief complaint on file.   An unsuccessful telephone outreach was attempted today. The patient was referred to the pharmacist for assistance with care management and care coordination.   This note is not being shared with the patient for the following reason: To respect privacy (The patient or proxy has requested that the information not be shared).  Follow Up Plan:   Earney Hamburg Upstream Scheduler

## 2019-10-19 ENCOUNTER — Telehealth: Payer: Self-pay | Admitting: Internal Medicine

## 2019-10-19 DIAGNOSIS — I2510 Atherosclerotic heart disease of native coronary artery without angina pectoris: Secondary | ICD-10-CM

## 2019-10-19 NOTE — Progress Notes (Signed)
  Chronic Care Management   Note  10/19/2019 Name: CHADWIN VIERGUTZ MRN: [id number] DOB: [birth year]  ZEAL STOCKLEY is a 69 y.o. year old male who is a primary care patient of Burns, Claudina Lick, MD. I reached out to [name] by phone today in response to a referral sent by Mr. Jennings Books PCP, [name], MD.   Mr. Derita was given information about Chronic Care Management services today including:  1. CCM service includes personalized support from designated clinical staff supervised by his physician, including individualized plan of care and coordination with other care providers 2. 24/7 contact phone numbers for assistance for urgent and routine care needs. 3. Service will only be billed when office clinical staff spend 20 minutes or more in a month to coordinate care. 4. Only one practitioner may furnish and bill the service in a calendar month. 5. The patient may stop CCM services at any time (effective at the end of the month) by phone call to the office staff.   Patient agreed to services and verbal consent obtained.   This note is not being shared with the patient for the following reason: To respect privacy (The patient or proxy has requested that the information not be shared).  Follow up plan:   Earney Hamburg Upstream Scheduler

## 2019-10-22 DIAGNOSIS — G4733 Obstructive sleep apnea (adult) (pediatric): Secondary | ICD-10-CM | POA: Diagnosis not present

## 2019-10-24 DIAGNOSIS — G4733 Obstructive sleep apnea (adult) (pediatric): Secondary | ICD-10-CM | POA: Diagnosis not present

## 2019-11-06 ENCOUNTER — Other Ambulatory Visit: Payer: Self-pay | Admitting: Internal Medicine

## 2019-11-24 DIAGNOSIS — G4733 Obstructive sleep apnea (adult) (pediatric): Secondary | ICD-10-CM | POA: Diagnosis not present

## 2019-12-18 ENCOUNTER — Telehealth: Payer: PPO

## 2019-12-18 NOTE — Chronic Care Management (AMB) (Deleted)
Chronic Care Management Pharmacy  Name: Sean Mullins  MRN: [id number] DOB: 27-Sep-1950   Chief Complaint/ HPI  Sean Mullins,  68 y.o. , male presents for their Initial CCM visit with the clinical pharmacist via telephone due to COVID-19 Pandemic.  PCP : [name], MD Patient Care Team: [name], MD as PCP - General (Internal Medicine) Charlton Haws, [facility] as Pharmacist (Pharmacist)  Their chronic conditions include: Hypertension, Hyperlipidemia, Diabetes, Osteoarthritis and OSA, Hx TIA, RLS, migraines   Office Visits: 08/28/19 Dr Quay Burow OV: restarted ropinirole for RLS. Advised wt loss.   Consult Visit: 09/05/19 Dr Brett Fairy (neurology): CPAP fitting for OSA.  06/15/19 Dr Dohmeier (neurology): referred for TIA-like sx. Assessed as optic migraine, TIA in 2016. Plan: sleep study, reduce caffeine, start Fioricet for migraine, ENT referral for septal deviation.  Allergies  Allergen Reactions  . Bystolic [Nebivolol Hcl] Other (See Comments)    Severe fatigue    Medications: Outpatient Encounter Medications as of 12/18/2019  Medication Sig  . amLODipine (NORVASC) 5 MG tablet Take 1 tablet (5 mg total) by mouth daily.  Marland Kitchen aspirin EC 81 MG tablet Take 1 tablet (81 mg total) by mouth daily.  . butalbital-acetaminophen-caffeine (FIORICET) 50-325-40 MG tablet Take 1 tablet by mouth every 6 (six) hours as needed for headache.  . celecoxib (CELEBREX) 200 MG capsule Take 1 capsule (200 mg total) by mouth 2 (two) times daily.  Marland Kitchen lisinopril (ZESTRIL) 20 MG tablet TAKE 1 TABLET BY MOUTH ONCE DAILY  . rOPINIRole (REQUIP) 0.25 MG tablet Take 1 tablet (0.25 mg total) by mouth daily after supper. Increase to 0.5 mg at bedtime after two nights  . rosuvastatin (CRESTOR) 10 MG tablet TAKE 1 TABLET BY MOUTH ONCE DAILY   No facility-administered encounter medications on file as of 12/18/2019.     Current Diagnosis/Assessment:    Goals Addressed   None     Hypertension     BP goal is:  <130/80  Office blood pressures are  BP Readings from Last 3 Encounters:  08/28/19 128/72  06/15/19 (!) 146/78  05/21/19 (!) 146/84   Kidney Function Lab Results  Component Value Date/Time   CREATININE 0.82 08/28/2019 08:52 AM   CREATININE 0.79 05/15/2019 05:00 AM   GFR 93.85 08/28/2019 08:52 AM   GFRNONAA >60 05/15/2019 05:00 AM   GFRAA >60 05/15/2019 05:00 AM   K 4.3 08/28/2019 08:52 AM   K 4.1 05/14/2019 11:30 AM   Patient checks BP at home {CHL HP BP Monitoring [phone]} Patient home BP readings are ranging: ***  Patient has failed these meds in the past: Bystolic Patient is currently {CHL Controlled/Uncontrolled:[phone]} on the following medications:  . Amlodipine 5 mg daily . Lisinopril 20 mg daily  We discussed {CHL HP Upstream Pharmacy discussion:[phone]}  Plan  Continue {CHL HP Upstream Pharmacy [phone]}     Hyperlipidemia   Hx TIA (2016) LDL goal < 70  Lipid Panel     Component Value Date/Time   CHOL 111 08/28/2019 0852   TRIG 185.0 (H) 08/28/2019 0852   HDL 34.10 (L) 08/28/2019 0852   LDLCALC 40 08/28/2019 0852    Hepatic Function Latest Ref Rng & Units 08/28/2019 05/14/2019 02/28/2019  Total Protein 6.0 - 8.3 g/dL 7.1 7.7 7.4  Albumin 3.5 - 5.2 g/dL 4.3 4.4 4.5  AST 0 - 37 U/L 30 52(H) 39(H)  ALT 0 - 53 U/L 48 67(H) 58(H)  Alk Phosphatase 39 - 117 U/L 61 59 54  Total  Bilirubin 0.2 - 1.2 mg/dL 1.2 1.5(H) 1.1  Bilirubin, Direct 0.0 - 0.3 mg/dL - - -     The ASCVD Risk score Mikey Bussing DC Jr., et al., 2013) failed to calculate for the following reasons:   The valid total cholesterol range is 130 to 320 mg/dL   Patient has failed these meds in past: atorvastatin Patient is currently {CHL Controlled/Uncontrolled:[phone]} on the following medications:  . Rosuvastatin 10 mg daily . Aspirin 81 mg daily  We discussed:  {CHL HP Upstream Pharmacy discussion:[phone]}  Plan  Continue {CHL HP Upstream  Pharmacy [phone]}  Diabetes   A1c goal <6.5%  Recent Relevant Labs: Lab Results  Component Value Date/Time   HGBA1C 6.0 08/28/2019 08:52 AM   HGBA1C 6.2 (H) 05/15/2019 05:30 AM   GFR 93.85 08/28/2019 08:52 AM   GFR 93.99 02/28/2019 08:52 AM   MICROALBUR 0.8 04/08/2016 08:35 AM    Last diabetic Eye exam:  Lab Results  Component Value Date/Time   HMDIABEYEEXA No Retinopathy 04/10/2018 12:00 AM    Last diabetic Foot exam: No results found for: HMDIABFOOTEX   No medications indicated.  We discussed: {CHL HP Upstream Pharmacy discussion:[phone]}  Plan  Continue {CHL HP Upstream Pharmacy [phone]}  Migraines   Patient has failed these meds in past: *** Patient is currently {CHL Controlled/Uncontrolled:[phone]} on the following medications:  . Butalbital-APAP-caffeine 50-325-40 mg q6h PRN  We discussed:  ***  Plan  Continue {CHL HP Upstream Pharmacy [phone]}  RLS   Patient has failed these meds in past: *** Patient is currently {CHL Controlled/Uncontrolled:[phone]} on the following medications:  . Ropinirole 0.25 mg 1-2 tab HS  We discussed:  ***  Plan  Continue {CHL HP Upstream Pharmacy [phone]}  Osteoarthritis   Patient has failed these meds in past: *** Patient is currently {CHL Controlled/Uncontrolled:[phone]} on the following medications:  . Celecoxib 200 mg BID  We discussed:  ***  Plan  Continue {CHL HP Upstream Pharmacy [phone]}  Medication Management   Pt uses [facility] for all medications Uses pill box? {Yes or If no, why not?:20788} Pt endorses ***% compliance  We discussed: ***  Plan  {US Pharmacy DSWV:[id number]}    Follow up: *** month phone visit  ***

## 2019-12-20 NOTE — Addendum Note (Signed)
Addended by: Aviva Signs M on: 12/20/2019 11:16 AM   Modules accepted: Orders

## 2019-12-21 ENCOUNTER — Telehealth: Payer: Self-pay | Admitting: Internal Medicine

## 2019-12-21 NOTE — Progress Notes (Signed)
°  Chronic Care Management   Note  12/21/2019 Name: KENDALE REMBOLD MRN: [id number] DOB: 1950-10-01  DEMARRIUS GUERRERO is a 69 y.o. year old male who is a primary care patient of Burns, Claudina Lick, MD. I reached out to [name] by phone today in response to a referral sent by Mr. Jennings Books PCP, [name], MD.   Mr. Iodice was given information about Chronic Care Management services today including:  1. CCM service includes personalized support from designated clinical staff supervised by his physician, including individualized plan of care and coordination with other care providers 2. 24/7 contact phone numbers for assistance for urgent and routine care needs. 3. Service will only be billed when office clinical staff spend 20 minutes or more in a month to coordinate care. 4. Only one practitioner may furnish and bill the service in a calendar month. 5. The patient may stop CCM services at any time (effective at the end of the month) by phone call to the office staff.   Patient agreed to services and verbal consent obtained.   Follow up plan:   Earney Hamburg Upstream Scheduler

## 2020-01-24 ENCOUNTER — Ambulatory Visit: Payer: PPO

## 2020-01-24 NOTE — Chronic Care Management (AMB) (Unsigned)
Chronic Care Management Pharmacy  Name: Sean Mullins  MRN: [id number] DOB: 1950-11-14   Chief Complaint/ HPI  [name],  69 y.o. , male presents for their Initial CCM visit with the clinical pharmacist In office.  PCP : [name], MD Patient Care Team: [name], MD as PCP - General (Internal Medicine) Charlton Haws, [facility] as Pharmacist (Pharmacist)  Their chronic conditions include: Hypertension, Hyperlipidemia, Diabetes, Coronary Artery Disease, Osteoarthritis and Hx TIA, OSA, Migraine, RLS   Office Visits: 08/28/19 Dr Quay Burow OV: chronic f/u, restarted ropinirole for RLS, gave PPSV23.   Consult Visit: 09/05/19 dr Dohmeier (neurology): CPAP fitting 08/05/19 Dr Dohmeier (neurology): sleep study indicating severe OSA. Advised CPAP.  Allergies  Allergen Reactions  . Bystolic [Nebivolol Hcl] Other (See Comments)    Severe fatigue    Medications: Outpatient Encounter Medications as of 01/24/2020  Medication Sig  . amLODipine (NORVASC) 5 MG tablet Take 1 tablet (5 mg total) by mouth daily.  Marland Kitchen aspirin EC 81 MG tablet Take 1 tablet (81 mg total) by mouth daily.  . butalbital-acetaminophen-caffeine (FIORICET) 50-325-40 MG tablet Take 1 tablet by mouth every 6 (six) hours as needed for headache.  . celecoxib (CELEBREX) 200 MG capsule Take 1 capsule (200 mg total) by mouth 2 (two) times daily.  Marland Kitchen lisinopril (ZESTRIL) 20 MG tablet TAKE 1 TABLET BY MOUTH ONCE DAILY  . rOPINIRole (REQUIP) 0.25 MG tablet Take 1 tablet (0.25 mg total) by mouth daily after supper. Increase to 0.5 mg at bedtime after two nights  . rosuvastatin (CRESTOR) 10 MG tablet TAKE 1 TABLET BY MOUTH ONCE DAILY   No facility-administered encounter medications on file as of 01/24/2020.     Current Diagnosis/Assessment:    Goals Addressed   None     Hypertension   BP goal is:  <130/80  Office blood pressures are  BP Readings from Last 3 Encounters:  08/28/19 128/72  06/15/19 (!)  146/78  05/21/19 (!) 146/84   Kidney Function Lab Results  Component Value Date/Time   CREATININE 0.82 08/28/2019 08:52 AM   CREATININE 0.79 05/15/2019 05:00 AM   GFR 93.85 08/28/2019 08:52 AM   GFRNONAA >60 05/15/2019 05:00 AM   GFRAA >60 05/15/2019 05:00 AM   K 4.3 08/28/2019 08:52 AM   K 4.1 05/14/2019 11:30 AM   Patient checks BP at home {CHL HP BP Monitoring Frequency:[phone]} Patient home BP readings are ranging: ***  Patient has failed these meds in the past: Bystolic Patient is currently {CHL Controlled/Uncontrolled:[phone]} on the following medications:  . Amlodipine 5 mg daily . Lisinopril 20 mg daily  We discussed {CHL HP Upstream Pharmacy discussion:[phone]}  Plan  Continue {CHL HP Upstream Pharmacy Plans:[phone]}     Hyperlipidemia   LDL goal < 70 Coronary atherosclerosis (hx cath w/ minimal disease, not in chart)  Lipid Panel     Component Value Date/Time   CHOL 111 08/28/2019 0852   TRIG 185.0 (H) 08/28/2019 0852   HDL 34.10 (L) 08/28/2019 0852   LDLCALC 40 08/28/2019 0852    Hepatic Function Latest Ref Rng & Units 08/28/2019 05/14/2019 02/28/2019  Total Protein 6.0 - 8.3 g/dL 7.1 7.7 7.4  Albumin 3.5 - 5.2 g/dL 4.3 4.4 4.5  AST 0 - 37 U/L 30 52(H) 39(H)  ALT 0 - 53 U/L 48 67(H) 58(H)  Alk Phosphatase 39 - 117 U/L 61 59 54  Total Bilirubin 0.2 - 1.2 mg/dL 1.2 1.5(H) 1.1  Bilirubin, Direct 0.0 - 0.3 mg/dL - - -  The ASCVD Risk score (Goff DC Jr., et al., 2013) failed to calculate for the following reasons:   The valid total cholesterol range is 130 to 320 mg/dL   Patient has failed these meds in past: *** Patient is currently {CHL Controlled/Uncontrolled:2109141014} on the following medications:  . Rosuvastatin 10 mg daily . Aspirin 81 mg daily  We discussed:  {CHL HP Upstream Pharmacy discussion:2109141007}  Plan  Continue {CHL HP Upstream Pharmacy Plans:2109141003}  Diabetes   A1c goal <6.5%  Recent Relevant Labs: Lab  Results  Component Value Date/Time   HGBA1C 6.0 08/28/2019 08:52 AM   HGBA1C 6.2 (H) 05/15/2019 05:30 AM   GFR 93.85 08/28/2019 08:52 AM   GFR 93.99 02/28/2019 08:52 AM   MICROALBUR 0.8 04/08/2016 08:35 AM    Last diabetic Eye exam:  Lab Results  Component Value Date/Time   HMDIABEYEEXA No Retinopathy 04/10/2018 12:00 AM    Last diabetic Foot exam: No results found for: HMDIABFOOTEX   Checking BG: {CHL HP Blood Glucose Monitoring Frequency:2109141001}  Recent FBG Readings: *** Recent pre-meal BG readings: *** Recent 2hr PP BG readings:  *** Recent HS BG readings: ***  Patient has failed these meds in past: *** Patient is currently {CHL Controlled/Uncontrolled:2109141014} on the following medications: . No medications  We discussed: {CHL HP Upstream Pharmacy discussion:2109141007}  Plan  Continue {CHL HP Upstream Pharmacy Plans:2109141003}  Osteoarthritis   Patient has failed these meds in past: *** Patient is currently {CHL Controlled/Uncontrolled:2109141014} on the following medications:  . Celecoxib 200 mg BID  We discussed:  ***  Plan  Continue {CHL HP Upstream Pharmacy Plans:2109141003}  Migraine   Patient has failed these meds in past: *** Patient is currently {CHL Controlled/Uncontrolled:2109141014} on the following medications:  . Butalbital-APAP-caffeine 50-325-40 mg PRN  We discussed:  ***  Plan  Continue {CHL HP Upstream Pharmacy Plans:2109141003}  RLS   Patient has failed these meds in past: *** Patient is currently {CHL Controlled/Uncontrolled:2109141014} on the following medications:  . Ropinirole 0.25 mg - 2 tab HS  We discussed:  ***  Plan  Continue {CHL HP Upstream Pharmacy Plans:2109141003}  Medication Management   Pt uses [place] [facility] for all medications Uses pill box? {Yes or If no, why not?:20788} Pt endorses ***% compliance  We discussed: ***  Plan  {US Pharmacy Plan:23885}    Follow up: *** month phone  visit  ***   

## 2020-02-20 ENCOUNTER — Other Ambulatory Visit: Payer: Self-pay | Admitting: Internal Medicine

## 2020-02-27 NOTE — Progress Notes (Signed)
Subjective:    Patient ID: Sean Mullins, male    DOB: 08-10-1950, 69 y.o.   MRN: [id number]  HPI He is here for a physical exam.   He has no concerns.   Medications and allergies reviewed with patient and updated if appropriate.  Patient Active Problem List   Diagnosis Date Noted  . Severe sleep apnea 09/13/2019  . Complex sleep apnea syndrome 08/16/2019  . Chronic intermittent hypoxia with obstructive sleep apnea 08/16/2019  . Migraine 05/15/2019  . Diverticulitis 02/28/2019  . BPPV (benign paroxysmal positional vertigo) 01/25/2016  . Essential hypertension 04/29/2015  . History of TIA (transient ischemic attack) 04/29/2015  . Hyperlipidemia 04/29/2015  . Diabetes ([place]) 04/29/2015  . Obese 05/17/2008  . OBSTRUCTIVE SLEEP APNEA, severe 05/17/2008  . RESTLESS LEG SYNDROME 05/17/2008  . Coronary atherosclerosis 05/17/2008  . Osteoarthritis 05/17/2008    Current Outpatient Medications on File Prior to Visit  Medication Sig Dispense Refill  . amLODipine (NORVASC) 5 MG tablet Take 1 tablet (5 mg total) by mouth daily. 90 tablet 3  . aspirin EC 81 MG tablet Take 1 tablet (81 mg total) by mouth daily. 30 tablet 0  . butalbital-acetaminophen-caffeine (FIORICET) 50-325-40 MG tablet Take 1 tablet by mouth every 6 (six) hours as needed for headache. 10 tablet 3  . celecoxib (CELEBREX) 200 MG capsule Take 1 capsule (200 mg total) by mouth 2 (two) times daily. 180 capsule 0  . lisinopril (ZESTRIL) 20 MG tablet TAKE 1 TABLET BY MOUTH ONCE DAILY 90 tablet 1  . rosuvastatin (CRESTOR) 10 MG tablet TAKE 1 TABLET BY MOUTH ONCE DAILY 90 tablet 1  . rOPINIRole (REQUIP) 0.25 MG tablet Take 1 tablet (0.25 mg total) by mouth daily after supper. Increase to 0.5 mg at bedtime after two nights (Patient not taking: Reported on 02/28/2020) 60 tablet 5   No current facility-administered medications on file prior to visit.    Past Medical History:  Diagnosis Date  . Anxiety   . Arthritis   .  Diverticula of colon   . TIA (transient ischemic attack)    November 2013    Past Surgical History:  Procedure Laterality Date  . DUODENAL DIVERTICULECTOMY     2003  . KNEE ARTHROPLASTY Left     Social History   Socioeconomic History  . Marital status: Married    Spouse name: Not on file  . Number of children: Not on file  . Years of education: Not on file  . Highest education level: Not on file  Occupational History  . Not on file  Tobacco Use  . Smoking status: Former Smoker    Packs/day: 1.50    Years: 30.00    Pack years: 45.00    Quit date: 06/07/1996    Years since quitting: 23.7  . Smokeless tobacco: Never Used  Substance and Sexual Activity  . Alcohol use: Yes    Comment: ocassional   . Drug use: Not on file  . Sexual activity: Not on file  Other Topics Concern  . Not on file  Social History Narrative   No regular exercise.    Social Determinants of Health   Financial Resource Strain:   . Difficulty of Paying Living Expenses: Not on file  Food Insecurity:   . Worried About Charity fundraiser in the Last Year: Not on file  . Ran Out of Food in the Last Year: Not on file  Transportation Needs:   . Lack of Transportation (Medical): Not  on file  . Lack of Transportation (Non-Medical): Not on file  Physical Activity:   . Days of Exercise per Week: Not on file  . Minutes of Exercise per Session: Not on file  Stress:   . Feeling of Stress : Not on file  Social Connections:   . Frequency of Communication with Friends and Family: Not on file  . Frequency of Social Gatherings with Friends and Family: Not on file  . Attends Religious Services: Not on file  . Active Member of Clubs or Organizations: Not on file  . Attends Archivist Meetings: Not on file  . Marital Status: Not on file    Family History  Problem Relation Age of Onset  . Transient ischemic attack Father     Review of Systems  Constitutional: Negative for chills and fever.    HENT:       Tickle in throat on occ - ? Related to lisinopril  Eyes: Negative for visual disturbance (hazy at times).  Respiratory: Negative for cough, shortness of breath and wheezing.   Cardiovascular: Negative for chest pain, palpitations and leg swelling.  Gastrointestinal: Negative for abdominal pain, blood in stool, constipation, diarrhea and nausea.       No gerd  Genitourinary: Negative for difficulty urinating, dysuria and hematuria.  Musculoskeletal: Positive for arthralgias (fingers, mild knee/hips) and back pain (occ with overexertion).  Skin: Negative for color change and rash.  Neurological: Negative for light-headedness and headaches.  Psychiatric/Behavioral: Negative for dysphoric mood. The patient is not nervous/anxious.        Objective:   Vitals:   02/28/20 0845  BP: 140/82  Pulse: 60  Temp: 98.6 F (37 C)  SpO2: 96%   Filed Weights   02/28/20 0845  Weight: 283 lb 9.6 oz (128.6 kg)   Body mass index is 36.41 kg/m.  BP Readings from Last 3 Encounters:  02/28/20 140/82  08/28/19 128/72  06/15/19 (!) 146/78    Wt Readings from Last 3 Encounters:  02/28/20 283 lb 9.6 oz (128.6 kg)  08/28/19 285 lb (129.3 kg)  06/15/19 285 lb (129.3 kg)     Physical Exam Constitutional: He appears well-developed and well-nourished. No distress.  HENT:  Head: Normocephalic and atraumatic.  Right Ear: External ear normal.  Left Ear: External ear normal.  Mouth/Throat: Oropharynx is clear and moist.  Normal ear canals and TM b/l  Eyes: Conjunctivae and EOM are normal.  Neck: Neck supple. No tracheal deviation present. No thyromegaly present.  No carotid bruit  Cardiovascular: Normal rate, regular rhythm, normal heart sounds and intact distal pulses.   No murmur heard. Pulmonary/Chest: Effort normal and breath sounds normal. No respiratory distress. He has no wheezes. He has no rales.  Abdominal: Soft. He exhibits no distension. There is no tenderness.   Genitourinary: deferred  Musculoskeletal: He exhibits no edema.  Lymphadenopathy:   He has no cervical adenopathy.  Skin: Skin is warm and dry. He is not diaphoretic.  Psychiatric: He has a normal mood and affect. His behavior is normal.         Assessment & Plan:   Physical exam: Screening blood work  ordered Immunizations  Flu shot today, has not Covid,(? Had covid- thinks he had it - had all the symptoms, but did not test pos) discussed shingrix Colonoscopy   Deferred.  Had cologuard last year through insurance - it was negative Eye exams   Due - will schedule Exercise   None- will start walking again, has  gym system inside house - will start using Weight  advised weight loss Substance abuse     none  See Problem List for Assessment and Plan of chronic medical problems.   This visit occurred during the SARS-CoV-2 public health emergency.  Safety protocols were in place, including screening questions prior to the visit, additional usage of staff PPE, and extensive cleaning of exam room while observing appropriate contact time as indicated for disinfecting solutions.

## 2020-02-27 NOTE — Patient Instructions (Addendum)
Blood work was ordered.    All other Health Maintenance issues reviewed.   All recommended immunizations and age-appropriate screenings are up-to-date or discussed.  Flu immunization administered today.    Medications reviewed and updated.  Changes include :   none    Please followup in 6 months    Health Maintenance, Male Adopting a healthy lifestyle and getting preventive care are important in promoting health and wellness. Ask your health care provider about:  The right schedule for you to have regular tests and exams.  Things you can do on your own to prevent diseases and keep yourself healthy. What should I know about diet, weight, and exercise? Eat a healthy diet   Eat a diet that includes plenty of vegetables, fruits, low-fat dairy products, and lean protein.  Do not eat a lot of foods that are high in solid fats, added sugars, or sodium. Maintain a healthy weight Body mass index (BMI) is a measurement that can be used to identify possible weight problems. It estimates body fat based on height and weight. Your health care provider can help determine your BMI and help you achieve or maintain a healthy weight. Get regular exercise Get regular exercise. This is one of the most important things you can do for your health. Most adults should:  Exercise for at least 150 minutes each week. The exercise should increase your heart rate and make you sweat (moderate-intensity exercise).  Do strengthening exercises at least twice a week. This is in addition to the moderate-intensity exercise.  Spend less time sitting. Even light physical activity can be beneficial. Watch cholesterol and blood lipids Have your blood tested for lipids and cholesterol at 67 years of age, then have this test every 5 years. You may need to have your cholesterol levels checked more often if:  Your lipid or cholesterol levels are high.  You are older than 67 years of age.  You are at high risk for  heart disease. What should I know about cancer screening? Many types of cancers can be detected early and may often be prevented. Depending on your health history and family history, you may need to have cancer screening at various ages. This may include screening for:  Colorectal cancer.  Prostate cancer.  Skin cancer.  Lung cancer. What should I know about heart disease, diabetes, and high blood pressure? Blood pressure and heart disease  High blood pressure causes heart disease and increases the risk of stroke. This is more likely to develop in people who have high blood pressure readings, are of African descent, or are overweight.  Talk with your health care provider about your target blood pressure readings.  Have your blood pressure checked: ? Every 3-5 years if you are 18-39 years of age. ? Every year if you are 40 years old or older.  If you are between the ages of 65 and 75 and are a current or former smoker, ask your health care provider if you should have a one-time screening for abdominal aortic aneurysm (AAA). Diabetes Have regular diabetes screenings. This checks your fasting blood sugar level. Have the screening done:  Once every three years after age 45 if you are at a normal weight and have a low risk for diabetes.  More often and at a younger age if you are overweight or have a high risk for diabetes. What should I know about preventing infection? Hepatitis B If you have a higher risk for hepatitis B, you should be screened   this virus. Talk with your health care provider to find out if you are at risk for hepatitis B infection. Hepatitis C Blood testing is recommended for:  Everyone born from 1945 through 1965.  Anyone with known risk factors for hepatitis C. Sexually transmitted infections (STIs)  You should be screened each year for STIs, including gonorrhea and chlamydia, if: ? You are sexually active and are younger than 67 years of age. ? You are  older than 67 years of age and your health care provider tells you that you are at risk for this type of infection. ? Your sexual activity has changed since you were last screened, and you are at increased risk for chlamydia or gonorrhea. Ask your health care provider if you are at risk.  Ask your health care provider about whether you are at high risk for HIV. Your health care provider may recommend a prescription medicine to help prevent HIV infection. If you choose to take medicine to prevent HIV, you should first get tested for HIV. You should then be tested every 3 months for as long as you are taking the medicine. Follow these instructions at home: Lifestyle  Do not use any products that contain nicotine or tobacco, such as cigarettes, e-cigarettes, and chewing tobacco. If you need help quitting, ask your health care provider.  Do not use street drugs.  Do not share needles.  Ask your health care provider for help if you need support or information about quitting drugs. Alcohol use  Do not drink alcohol if your health care provider tells you not to drink.  If you drink alcohol: ? Limit how much you have to 0-2 drinks a day. ? Be aware of how much alcohol is in your drink. In the [place] one drink equals one 12 oz bottle of beer (355 mL), one 5 oz glass of wine (148 mL), or one 1 oz glass of hard liquor (44 mL). General instructions  Schedule regular health, dental, and eye exams.  Stay current with your vaccines.  Tell your health care provider if: ? You often feel depressed. ? You have ever been abused or do not feel safe at home. Summary  Adopting a healthy lifestyle and getting preventive care are important in promoting health and wellness.  Follow your health care provider's instructions about healthy diet, exercising, and getting tested or screened for diseases.  Follow your health care provider's instructions on monitoring your cholesterol and blood pressure. This  information is not intended to replace advice given to you by your health care provider. Make sure you discuss any questions you have with your health care provider. Document Revised: 05/17/2018 Document Reviewed: 05/17/2018 Elsevier Patient Education  2020 [employer]  

## 2020-02-28 ENCOUNTER — Other Ambulatory Visit: Payer: Self-pay

## 2020-02-28 ENCOUNTER — Encounter: Payer: Self-pay | Admitting: Internal Medicine

## 2020-02-28 ENCOUNTER — Ambulatory Visit (INDEPENDENT_AMBULATORY_CARE_PROVIDER_SITE_OTHER): Payer: PPO | Admitting: Internal Medicine

## 2020-02-28 VITALS — BP 140/82 | HR 60 | Temp 98.6°F | Ht 74.0 in | Wt 283.6 lb

## 2020-02-28 DIAGNOSIS — Z0184 Encounter for antibody response examination: Secondary | ICD-10-CM

## 2020-02-28 DIAGNOSIS — M159 Polyosteoarthritis, unspecified: Secondary | ICD-10-CM

## 2020-02-28 DIAGNOSIS — Z23 Encounter for immunization: Secondary | ICD-10-CM

## 2020-02-28 DIAGNOSIS — E119 Type 2 diabetes mellitus without complications: Secondary | ICD-10-CM

## 2020-02-28 DIAGNOSIS — I10 Essential (primary) hypertension: Secondary | ICD-10-CM

## 2020-02-28 DIAGNOSIS — Z6836 Body mass index (BMI) 36.0-36.9, adult: Secondary | ICD-10-CM

## 2020-02-28 DIAGNOSIS — G2581 Restless legs syndrome: Secondary | ICD-10-CM | POA: Diagnosis not present

## 2020-02-28 DIAGNOSIS — G4733 Obstructive sleep apnea (adult) (pediatric): Secondary | ICD-10-CM | POA: Diagnosis not present

## 2020-02-28 DIAGNOSIS — Z8673 Personal history of transient ischemic attack (TIA), and cerebral infarction without residual deficits: Secondary | ICD-10-CM | POA: Diagnosis not present

## 2020-02-28 DIAGNOSIS — E7849 Other hyperlipidemia: Secondary | ICD-10-CM

## 2020-02-28 DIAGNOSIS — Z0000 Encounter for general adult medical examination without abnormal findings: Secondary | ICD-10-CM

## 2020-02-28 DIAGNOSIS — I2510 Atherosclerotic heart disease of native coronary artery without angina pectoris: Secondary | ICD-10-CM

## 2020-02-28 DIAGNOSIS — M8949 Other hypertrophic osteoarthropathy, multiple sites: Secondary | ICD-10-CM | POA: Diagnosis not present

## 2020-02-28 DIAGNOSIS — Z125 Encounter for screening for malignant neoplasm of prostate: Secondary | ICD-10-CM

## 2020-02-28 DIAGNOSIS — G43109 Migraine with aura, not intractable, without status migrainosus: Secondary | ICD-10-CM | POA: Diagnosis not present

## 2020-02-28 NOTE — Assessment & Plan Note (Signed)
Chronic Check lipid panel  Continue daily statin Regular exercise and healthy diet encouraged  

## 2020-02-28 NOTE — Assessment & Plan Note (Signed)
Chronic BP well controlled, 130's/70's at home Current regimen effective and well tolerated Continue current medications at current doses cmp

## 2020-02-28 NOTE — Assessment & Plan Note (Signed)
Chronic No angina symptoms Continue ASA, statin

## 2020-02-28 NOTE — Assessment & Plan Note (Signed)
Chronic Uses cpap nightly 

## 2020-02-28 NOTE — Assessment & Plan Note (Signed)
Chronic No migraine in forever Has fioricet at home in case

## 2020-02-28 NOTE — Assessment & Plan Note (Signed)
Chronic requip did not help Sleeping ok now-  Denies any RLS symptoms now Will just monitor

## 2020-02-28 NOTE — Assessment & Plan Note (Signed)
Chronic Advised weight Start exercising - will walk more consistently and use home gym system

## 2020-02-28 NOTE — Assessment & Plan Note (Signed)
Chronic Continue ASA, lisinopril, crestor

## 2020-02-28 NOTE — Assessment & Plan Note (Signed)
Chronic Taking celebrex daily Pain controlled continue

## 2020-02-28 NOTE — Assessment & Plan Note (Signed)
Chronic Diet controlled Check a1c Low sugar / carb diet Stressed regular exercise

## 2020-02-29 LAB — COMPLETE METABOLIC PANEL WITH GFR
AG Ratio: 1.4 (calc) (ref 1.0–2.5)
ALT: 49 U/L — ABNORMAL HIGH (ref 9–46)
AST: 32 U/L (ref 10–35)
Albumin: 4.4 g/dL (ref 3.6–5.1)
Alkaline phosphatase (APISO): 59 U/L (ref 35–144)
BUN: 13 mg/dL (ref 7–25)
CO2: 26 mmol/L (ref 20–32)
Calcium: 10.1 mg/dL (ref 8.6–10.3)
Chloride: 103 mmol/L (ref 98–110)
Creat: 0.91 mg/dL (ref 0.70–1.25)
GFR, Est African American: 101 mL/min/{1.73_m2} (ref >=60)
GFR, Est Non African American: 87 mL/min/{1.73_m2} (ref >=60)
Globulin: 3.1 g/dL (calc) (ref 1.9–3.7)
Glucose, Bld: 120 mg/dL — ABNORMAL HIGH (ref 65–99)
Potassium: 5.2 mmol/L (ref 3.5–5.3)
Sodium: 139 mmol/L (ref 135–146)
Total Bilirubin: 1 mg/dL (ref 0.2–1.2)
Total Protein: 7.5 g/dL (ref 6.1–8.1)

## 2020-02-29 LAB — CBC WITH DIFFERENTIAL/PLATELET
Absolute Monocytes: 505 cells/uL (ref 200–950)
Basophils Absolute: 20 cells/uL (ref 0–200)
Basophils Relative: 0.4 %
Eosinophils Absolute: 132 cells/uL (ref 15–500)
Eosinophils Relative: 2.7 %
HCT: 46.7 % (ref 38.5–50.0)
Hemoglobin: 15.9 g/dL (ref 13.2–17.1)
Lymphs Abs: 2293 cells/uL (ref 850–3900)
MCH: 32.1 pg (ref 27.0–33.0)
MCHC: 34 g/dL (ref 32.0–36.0)
MCV: 94.2 fL (ref 80.0–100.0)
MPV: 11.1 fL (ref 7.5–12.5)
Monocytes Relative: 10.3 %
Neutro Abs: 1950 cells/uL (ref 1500–7800)
Neutrophils Relative %: 39.8 %
Platelets: 199 10*3/uL (ref 140–400)
RBC: 4.96 10*6/uL (ref 4.20–5.80)
RDW: 13 % (ref 11.0–15.0)
Total Lymphocyte: 46.8 %
WBC: 4.9 10*3/uL (ref 3.8–10.8)

## 2020-02-29 LAB — LIPID PANEL
Cholesterol: 119 mg/dL (ref <200)
HDL: 39 mg/dL — ABNORMAL LOW (ref >=40)
LDL Cholesterol (Calc): 58 mg/dL (calc)
Non-HDL Cholesterol (Calc): 80 mg/dL (calc) (ref <130)
Total CHOL/HDL Ratio: 3.1 (calc) (ref <5.0)
Triglycerides: 138 mg/dL (ref <150)

## 2020-02-29 LAB — SARS-COV-2 ANTIBODIES: SARS-CoV-2 Antibodies: POSITIVE

## 2020-02-29 LAB — HEMOGLOBIN A1C
Hgb A1c MFr Bld: 6 % of total Hgb — ABNORMAL HIGH (ref <5.7)
Mean Plasma Glucose: 126 (calc)
eAG (mmol/L): 7 (calc)

## 2020-02-29 LAB — SPECIMEN COMPROMISED

## 2020-02-29 LAB — TSH: TSH: 1.54 mIU/L (ref 0.40–4.50)

## 2020-02-29 LAB — PSA: PSA: 0.88 ng/mL (ref <=4.0)

## 2020-02-29 LAB — SPECIMEN STATUS REPORT

## 2020-03-11 DIAGNOSIS — Z20822 Contact with and (suspected) exposure to covid-19: Secondary | ICD-10-CM | POA: Diagnosis not present

## 2020-03-12 ENCOUNTER — Ambulatory Visit (INDEPENDENT_AMBULATORY_CARE_PROVIDER_SITE_OTHER): Payer: PPO

## 2020-03-12 ENCOUNTER — Other Ambulatory Visit: Payer: Self-pay

## 2020-03-12 DIAGNOSIS — Z0000 Encounter for general adult medical examination without abnormal findings: Secondary | ICD-10-CM

## 2020-03-12 NOTE — Patient Instructions (Addendum)
Mr. Sean Mullins , Thank you for taking time to come for your Medicare Wellness Visit. I appreciate your ongoing commitment to your health goals. Please review the following plan we discussed and let me know if I can assist you in the future.   Screening recommendations/referrals: Colonoscopy: patient declined repeat colonoscopy Recommended yearly ophthalmology/optometry visit for glaucoma screening and checkup Recommended yearly dental visit for hygiene and checkup  Vaccinations: Influenza vaccine: 02/28/2020 Pneumococcal vaccine: up to date Tdap vaccine: 06/07/2013; due every 10 years Shingles vaccine: declined; was discussed at last office visit with pcp   Covid-19: declined  Advanced directives: Please bring a copy of your health care power of attorney and living will to the office at your convenience.  Conditions/risks identified: Yes; Reviewed health maintenance screenings with patient today and relevant education, vaccines, and/or referrals were provided. Please continue to do your personal lifestyle choices by: daily care of teeth and gums, regular physical activity (goal should be 5 days a week for 30 minutes), eat a healthy diet, avoid tobacco and drug use, limiting any alcohol intake, taking a low-dose aspirin (if not allergic or have been advised by your provider otherwise) and taking vitamins and minerals as recommended by your provider. Continue doing brain stimulating activities (puzzles, reading, adult coloring books, staying active) to keep memory sharp. Continue to eat heart healthy diet (full of fruits, vegetables, whole grains, lean protein, water--limit salt, fat, and sugar intake) and increase physical activity as tolerated.  Next appointment: Please schedule your next Medicare Wellness Visit with your Nurse Health Advisor in 1 year by calling [phone].  Preventive Care 67 Years and Older, Male Preventive care refers to lifestyle choices and visits with your health care  provider that can promote health and wellness. What does preventive care include?  A yearly physical exam. This is also called an annual well check.  Dental exams once or twice a year.  Routine eye exams. Ask your health care provider how often you should have your eyes checked.  Personal lifestyle choices, including:  Daily care of your teeth and gums.  Regular physical activity.  Eating a healthy diet.  Avoiding tobacco and drug use.  Limiting alcohol use.  Practicing safe sex.  Taking low doses of aspirin every day.  Taking vitamin and mineral supplements as recommended by your health care provider. What happens during an annual well check? The services and screenings done by your health care provider during your annual well check will depend on your age, overall health, lifestyle risk factors, and family history of disease. Counseling  Your health care provider may ask you questions about your:  Alcohol use.  Tobacco use.  Drug use.  Emotional well-being.  Home and relationship well-being.  Sexual activity.  Eating habits.  History of falls.  Memory and ability to understand (cognition).  Work and work Statistician. Screening  You may have the following tests or measurements:  Height, weight, and BMI.  Blood pressure.  Lipid and cholesterol levels. These may be checked every 5 years, or more frequently if you are over 39 years old.  Skin check.  Lung cancer screening. You may have this screening every year starting at age 67 if you have a 30-pack-year history of smoking and currently smoke or have quit within the past 15 years.  Fecal occult blood test (FOBT) of the stool. You may have this test every year starting at age 67.  Flexible sigmoidoscopy or colonoscopy. You may have a sigmoidoscopy every 5 years or a  colonoscopy every 10 years starting at age 67.  Prostate cancer screening. Recommendations will vary depending on your family history and  other risks.  Hepatitis C blood test.  Hepatitis B blood test.  Sexually transmitted disease (STD) testing.  Diabetes screening. This is done by checking your blood sugar (glucose) after you have not eaten for a while (fasting). You may have this done every 1-3 years.  Abdominal aortic aneurysm (AAA) screening. You may need this if you are a current or former smoker.  Osteoporosis. You may be screened starting at age 20 if you are at high risk. Talk with your health care provider about your test results, treatment options, and if necessary, the need for more tests. Vaccines  Your health care provider may recommend certain vaccines, such as:  Influenza vaccine. This is recommended every year.  Tetanus, diphtheria, and acellular pertussis (Tdap, Td) vaccine. You may need a Td booster every 10 years.  Zoster vaccine. You may need this after age 27.  Pneumococcal 13-valent conjugate (PCV13) vaccine. One dose is recommended after age 61.  Pneumococcal polysaccharide (PPSV23) vaccine. One dose is recommended after age 38. Talk to your health care provider about which screenings and vaccines you need and how often you need them. This information is not intended to replace advice given to you by your health care provider. Make sure you discuss any questions you have with your health care provider. Document Released: 06/20/2015 Document Revised: 02/11/2016 Document Reviewed: 03/25/2015 Elsevier Interactive Patient Education  2017 [place] Prevention in the Home Falls can cause injuries. They can happen to people of all ages. There are many things you can do to make your home safe and to help prevent falls. What can I do on the outside of my home?  Regularly fix the edges of walkways and driveways and fix any cracks.  Remove anything that might make you trip as you walk through a door, such as a raised step or threshold.  Trim any bushes or trees on the path to your  home.  Use bright outdoor lighting.  Clear any walking paths of anything that might make someone trip, such as rocks or tools.  Regularly check to see if handrails are loose or broken. Make sure that both sides of any steps have handrails.  Any raised decks and porches should have guardrails on the edges.  Have any leaves, snow, or ice cleared regularly.  Use sand or salt on walking paths during winter.  Clean up any spills in your garage right away. This includes oil or grease spills. What can I do in the bathroom?  Use night lights.  Install grab bars by the toilet and in the tub and shower. Do not use towel bars as grab bars.  Use non-skid mats or decals in the tub or shower.  If you need to sit down in the shower, use a plastic, non-slip stool.  Keep the floor dry. Clean up any water that spills on the floor as soon as it happens.  Remove soap buildup in the tub or shower regularly.  Attach bath mats securely with double-sided non-slip rug tape.  Do not have throw rugs and other things on the floor that can make you trip. What can I do in the bedroom?  Use night lights.  Make sure that you have a light by your bed that is easy to reach.  Do not use any sheets or blankets that are too big for your bed. They  should not hang down onto the floor.  Have a firm chair that has side arms. You can use this for support while you get dressed.  Do not have throw rugs and other things on the floor that can make you trip. What can I do in the kitchen?  Clean up any spills right away.  Avoid walking on wet floors.  Keep items that you use a lot in easy-to-reach places.  If you need to reach something above you, use a strong step stool that has a grab bar.  Keep electrical cords out of the way.  Do not use floor polish or wax that makes floors slippery. If you must use wax, use non-skid floor wax.  Do not have throw rugs and other things on the floor that can make you  trip. What can I do with my stairs?  Do not leave any items on the stairs.  Make sure that there are handrails on both sides of the stairs and use them. Fix handrails that are broken or loose. Make sure that handrails are as long as the stairways.  Check any carpeting to make sure that it is firmly attached to the stairs. Fix any carpet that is loose or worn.  Avoid having throw rugs at the top or bottom of the stairs. If you do have throw rugs, attach them to the floor with carpet tape.  Make sure that you have a light switch at the top of the stairs and the bottom of the stairs. If you do not have them, ask someone to add them for you. What else can I do to help prevent falls?  Wear shoes that:  Do not have high heels.  Have rubber bottoms.  Are comfortable and fit you well.  Are closed at the toe. Do not wear sandals.  If you use a stepladder:  Make sure that it is fully opened. Do not climb a closed stepladder.  Make sure that both sides of the stepladder are locked into place.  Ask someone to hold it for you, if possible.  Clearly mark and make sure that you can see:  Any grab bars or handrails.  First and last steps.  Where the edge of each step is.  Use tools that help you move around (mobility aids) if they are needed. These include:  Canes.  Walkers.  Scooters.  Crutches.  Turn on the lights when you go into a dark area. Replace any light bulbs as soon as they burn out.  Set up your furniture so you have a clear path. Avoid moving your furniture around.  If any of your floors are uneven, fix them.  If there are any pets around you, be aware of where they are.  Review your medicines with your doctor. Some medicines can make you feel dizzy. This can increase your chance of falling. Ask your doctor what other things that you can do to help prevent falls. This information is not intended to replace advice given to you by your health care provider. Make  sure you discuss any questions you have with your health care provider. Document Released: 03/20/2009 Document Revised: 10/30/2015 Document Reviewed: 06/28/2014 Elsevier Interactive Patient Education  2017 [employer].

## 2020-03-12 NOTE — Progress Notes (Signed)
I connected with Sean Mullins today by telephone and verified that I am speaking with the correct person using two identifiers. Location patient: home Location provider: work Persons participating in the virtual visit: [name] and Lisette Abu, LPN.   I discussed the limitations, risks, security and privacy concerns of performing an evaluation and management service by telephone and the availability of in person appointments. I also discussed with the patient that there may be a patient responsible charge related to this service. The patient expressed understanding and verbally consented to this telephonic visit.    Interactive audio and video telecommunications were attempted between this provider and patient, however failed, due to patient having technical difficulties OR patient did not have access to video capability.  We continued and completed visit with audio only.  Some vital signs may be absent or patient reported.   Time Spent with patient on telephone encounter: 25 minutes  Subjective:   Sean Mullins is a 69 y.o. male who presents for Medicare Annual/Subsequent preventive examination.  Review of Systems    No ROS. Medicare Wellness Visit. Additional risk factors are reflected in social history. Cardiac Risk Factors include: advanced age (>11men, >26 women);dyslipidemia;family history of premature cardiovascular disease;hypertension;male gender;obesity (BMI >30kg/m2) Sleep Patterns: No sleep issues, feels rested on waking and sleeps 6-8 hours nightly. Home Safety/Smoke Alarms: Feels safe in home; uses home alarm. Smoke alarms in place. Living environment: 2-story home; Lives with spouse; no needs for DME; good support system. Seat Belt Safety/Bike Helmet: Wears seat belt.    Objective:    There were no vitals filed for this visit. There is no height or weight on file to calculate BMI.  Advanced Directives 03/12/2020 05/14/2019 09/06/2016 01/09/2016 04/25/2015   Does Patient Have a Medical Advance Directive? Yes No No No No  Type of Advance Directive Living will - - - -  Does patient want to make changes to medical advance directive? No - Patient declined - - - -  Would patient like information on creating a medical advance directive? - No - Patient declined - No - patient declined information Yes - Educational materials given    Current Medications (verified) Outpatient Encounter Medications as of 03/12/2020  Medication Sig  . amLODipine (NORVASC) 5 MG tablet Take 1 tablet (5 mg total) by mouth daily.  Marland Kitchen aspirin EC 81 MG tablet Take 1 tablet (81 mg total) by mouth daily.  . butalbital-acetaminophen-caffeine (FIORICET) 50-325-40 MG tablet Take 1 tablet by mouth every 6 (six) hours as needed for headache.  . celecoxib (CELEBREX) 200 MG capsule Take 1 capsule (200 mg total) by mouth 2 (two) times daily.  Marland Kitchen lisinopril (ZESTRIL) 20 MG tablet TAKE 1 TABLET BY MOUTH ONCE DAILY  . rOPINIRole (REQUIP) 0.25 MG tablet Take 1 tablet (0.25 mg total) by mouth daily after supper. Increase to 0.5 mg at bedtime after two nights (Patient not taking: Reported on 02/28/2020)  . rosuvastatin (CRESTOR) 10 MG tablet TAKE 1 TABLET BY MOUTH ONCE DAILY   No facility-administered encounter medications on file as of 03/12/2020.    Allergies (verified) Bystolic [nebivolol hcl]   History: Past Medical History:  Diagnosis Date  . Anxiety   . Arthritis   . Diverticula of colon   . TIA (transient ischemic attack)    November 2013   Past Surgical History:  Procedure Laterality Date  . DUODENAL DIVERTICULECTOMY     2003  . KNEE ARTHROPLASTY Left    Family History  Problem Relation Age  of Onset  . Transient ischemic attack Father    Social History   Socioeconomic History  . Marital status: Married    Spouse name: Not on file  . Number of children: Not on file  . Years of education: Not on file  . Highest education level: Not on file  Occupational History  .  Not on file  Tobacco Use  . Smoking status: Former Smoker    Packs/day: 1.50    Years: 30.00    Pack years: 45.00    Quit date: 06/07/1996    Years since quitting: 23.7  . Smokeless tobacco: Never Used  Substance and Sexual Activity  . Alcohol use: Yes    Comment: ocassional   . Drug use: Not on file  . Sexual activity: Not on file  Other Topics Concern  . Not on file  Social History Narrative   No regular exercise.    Social Determinants of Health   Financial Resource Strain: Low Risk   . Difficulty of Paying Living Expenses: Not hard at all  Food Insecurity: No Food Insecurity  . Worried About Charity fundraiser in the Last Year: Never true  . Ran Out of Food in the Last Year: Never true  Transportation Needs: No Transportation Needs  . Lack of Transportation (Medical): No  . Lack of Transportation (Non-Medical): No  Physical Activity: Sufficiently Active  . Days of Exercise per Week: 5 days  . Minutes of Exercise per Session: 30 min  Stress: No Stress Concern Present  . Feeling of Stress : Not at all  Social Connections: Socially Integrated  . Frequency of Communication with Friends and Family: More than three times a week  . Frequency of Social Gatherings with Friends and Family: More than three times a week  . Attends Religious Services: More than 4 times per year  . Active Member of Clubs or Organizations: Yes  . Attends Archivist Meetings: More than 4 times per year  . 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 no  Interpreter Needed?:  No  Information entered by :: Lisette Abu, LPN   Activities of Daily Living In your present state of health, do you have any difficulty performing the following activities: 03/12/2020 05/14/2019  Hearing? N N  Vision? N N  Difficulty concentrating or making decisions? N N  Walking or climbing stairs? N N  Dressing or bathing? N N  Doing errands, shopping? N N  Preparing Food and eating ? N -  Using the Toilet? N -  In the past six months, have you accidently leaked urine? N -  Do you have problems with loss of bowel control? N -  Managing your Medications? N -  Managing your Finances? N -  Housekeeping or managing your Housekeeping? N -  Some recent data might be hidden    Patient Care Team: [name], MD as PCP - General (Internal Medicine) Charlton Haws, [facility] as Pharmacist (Pharmacist)  Indicate any recent Medical Services you may have received from other than [facility] providers in the past year (date  may be approximate).     Assessment:   This is a routine wellness examination for Raed.  Hearing/Vision screen No exam data present  Dietary issues and exercise activities discussed: Current Exercise Habits: The patient has a physically strenuous job, but has no regular exercise apart from work.;Home exercise routine, Type of exercise: walking;treadmill;Other - see comments (works 2-3 days a week; very active), Time (Minutes): 30, Frequency (Times/Week): 5, Weekly Exercise (Minutes/Week): 150, Intensity: Moderate, Exercise limited by: None identified  Goals    . Pharmacy Care Plan      Depression Screen PHQ 2/9 Scores 03/12/2020 02/28/2020 02/28/2019 08/28/2018 08/24/2017  PHQ - 2 Score 0 0 0 0 0    Fall Risk Fall Risk  03/12/2020 06/15/2019 02/28/2019 08/28/2018 08/24/2017  Falls in the past year? 0 0 0 0 No  Number falls in past yr: 0 - 0 - -  Injury with Fall? 0 - - - -  Risk for fall due to : No Fall Risks - - - -  Follow up Falls evaluation completed - - - -     Any stairs in or around the home? Yes  If so, are there any without handrails? No  Home free of loose throw rugs in walkways, pet beds, electrical cords, etc? Yes  Adequate lighting in your home to reduce risk of falls? Yes   ASSISTIVE DEVICES UTILIZED TO PREVENT FALLS:  Life alert? No  Use of a cane, walker or w/c? No  Grab bars in the bathroom? No  Shower chair or bench in shower? No  Elevated toilet seat or a handicapped toilet? No   TIMED UP AND GO:  Was the test performed? No .  Length of time to ambulate 10 feet: 0 sec.   Gait steady and fast without use of assistive device  Cognitive Function: not indicated; patient is cogitatively intact.        Immunizations Immunization History  Administered Date(s) Administered  . Fluad Quad(high Dose 65+) 02/28/2019, 02/28/2020  . Influenza, High Dose Seasonal PF 02/24/2018  . Influenza,inj,Quad PF,6+ Mos 02/22/2017  . Influenza-Unspecified 04/17/2015  . Pneumococcal Conjugate-13 08/28/2018  . Pneumococcal Polysaccharide-23 08/28/2019  . Tdap 06/07/2013    TDAP status: Up to date Flu Vaccine status: Up to date Pneumococcal vaccine status: Up to date Covid-19 vaccine status: Declined, Education has been provided regarding the importance of this vaccine but patient still declined. Advised may receive this vaccine at local pharmacy or Health Dept.or vaccine clinic. Aware to provide a copy of the vaccination record if obtained from local pharmacy or Health Dept. Verbalized acceptance and understanding.  Qualifies for Shingles Vaccine? Yes   Zostavax completed No   Shingrix Completed?: No.    Education has been provided regarding the importance of this vaccine. Patient has been advised to call insurance company to determine out of pocket expense if they have not yet received this vaccine. Advised may also receive vaccine at local pharmacy or Health Dept. Verbalized acceptance and understanding.  Screening Tests Health  Maintenance  Topic Date Due  . COVID-19 Vaccine (1) Never done  . Fecal DNA (Cologuard)  Never done  . OPHTHALMOLOGY EXAM  04/11/2019  . FOOT EXAM  08/28/2019  . HEMOGLOBIN A1C  08/27/2020  . TETANUS/TDAP  06/08/2023  . INFLUENZA VACCINE  Completed  . Hepatitis C Screening  Completed  . PNA vac Low Risk Adult  Completed    Health Maintenance  Health Maintenance Due  Topic Date Due  . COVID-19 Vaccine (  1) Never done  . Fecal DNA (Cologuard)  Never done  . OPHTHALMOLOGY EXAM  04/11/2019  . FOOT EXAM  08/28/2019    Colorectal cancer screening: No longer required.  Patient declined repeat colonscopy.  Lung Cancer Screening: (Low Dose CT Chest recommended if Age 60-80 years, 30 pack-year currently smoking OR have quit w/in 15years.) does not qualify.   Lung Cancer Screening Referral: no  Additional Screening:  Hepatitis C Screening: does qualify; Completed yes  Vision Screening: Recommended annual ophthalmology exams for early detection of glaucoma and other disorders of the eye. Is the patient up to date with their annual eye exam?  Yes  Who is the provider or what is the name of the office in which the patient attends annual eye exams? [facility] located in [place], [place] If pt is not established with a provider, would they like to be referred to a provider to establish care? No .   Dental Screening: Recommended annual dental exams for proper oral hygiene  Community Resource Referral / Chronic Care Management: CRR required this visit?  No   CCM required this visit?  No      Plan:     I have personally reviewed and noted the following in the patient's chart:   . Medical and social history . Use of alcohol, tobacco or illicit drugs  . Current medications and supplements . Functional ability and status . Nutritional status . Physical activity . Advanced directives . List of other physicians . Hospitalizations, surgeries, and ER visits in previous 12  months . Vitals . Screenings to include cognitive, depression, and falls . Referrals and appointments  In addition, I have reviewed and discussed with patient certain preventive protocols, quality metrics, and best practice recommendations. A written personalized care plan for preventive services as well as general preventive health recommendations were provided to patient.     Sheral Flow, LPN   [id number]   Nurse Notes:  Patient is cogitatively intact. There were no vitals filed for this visit. There is no height or weight on file to calculate BMI. Patient uses a c-pap machine for assistive device.

## 2020-04-18 ENCOUNTER — Telehealth: Payer: Self-pay | Admitting: Pharmacist

## 2020-04-18 NOTE — Progress Notes (Signed)
    Chronic Care Management Pharmacy Assistant   Name: Sean Mullins  MRN: [id number] DOB: [birth year]  Reason for Encounter: Medication Adherence Call   PCP : [name], MD  Allergies:   Allergies  Allergen Reactions  . Bystolic [Nebivolol Hcl] Other (See Comments)    Severe fatigue    Medications: Outpatient Encounter Medications as of 04/18/2020  Medication Sig  . amLODipine (NORVASC) 5 MG tablet Take 1 tablet (5 mg total) by mouth daily.  Marland Kitchen aspirin EC 81 MG tablet Take 1 tablet (81 mg total) by mouth daily.  . butalbital-acetaminophen-caffeine (FIORICET) 50-325-40 MG tablet Take 1 tablet by mouth every 6 (six) hours as needed for headache.  . celecoxib (CELEBREX) 200 MG capsule Take 1 capsule (200 mg total) by mouth 2 (two) times daily.  Marland Kitchen lisinopril (ZESTRIL) 20 MG tablet TAKE 1 TABLET BY MOUTH ONCE DAILY  . rOPINIRole (REQUIP) 0.25 MG tablet Take 1 tablet (0.25 mg total) by mouth daily after supper. Increase to 0.5 mg at bedtime after two nights (Patient not taking: Reported on 02/28/2020)  . rosuvastatin (CRESTOR) 10 MG tablet TAKE 1 TABLET BY MOUTH ONCE DAILY   No facility-administered encounter medications on file as of 04/18/2020.    Current Diagnosis: Patient Active Problem List   Diagnosis Date Noted  . Severe sleep apnea 09/13/2019  . Complex sleep apnea syndrome 08/16/2019  . Chronic intermittent hypoxia with obstructive sleep apnea 08/16/2019  . Migraine 05/15/2019  . Diverticulitis 02/28/2019  . BPPV (benign paroxysmal positional vertigo) 01/25/2016  . Essential hypertension 04/29/2015  . History of TIA (transient ischemic attack) 04/29/2015  . Hyperlipidemia 04/29/2015  . Diabetes ([place]) 04/29/2015  . Obese 05/17/2008  . OBSTRUCTIVE SLEEP APNEA, severe 05/17/2008  . RESTLESS LEG SYNDROME 05/17/2008  . Coronary atherosclerosis 05/17/2008  . Osteoarthritis 05/17/2008    Goals Addressed   None     Follow-Up:  Pharmacist Review    The  patient was on the medication adherence list for his Lisinopril. After speaking with the patient he stated that he ran out of medications but he just picked up an 90-day supply about 14 days ago. He is making sure that he taking all of his medication daily.   Rosendo Gros, [facility]  Practice Team Manager/ CPA (Clinical Pharmacist Assistant) [phone]

## 2020-05-06 ENCOUNTER — Emergency Department (HOSPITAL_COMMUNITY)
Admission: EM | Admit: 2020-05-06 | Discharge: 2020-05-06 | Disposition: A | Discharge status: Home / Self Care | Payer: PPO | Attending: Emergency Medicine | Admitting: Emergency Medicine

## 2020-05-06 ENCOUNTER — Inpatient Hospital Stay
Admission: EM | Admit: 2020-05-06 | Discharge: 2020-05-07 | DRG: 392 | Disposition: A | Discharge status: Home / Self Care | Payer: PPO | Attending: Internal Medicine | Admitting: Internal Medicine

## 2020-05-06 ENCOUNTER — Emergency Department: Payer: PPO

## 2020-05-06 ENCOUNTER — Telehealth: Payer: Self-pay | Admitting: Internal Medicine

## 2020-05-06 ENCOUNTER — Other Ambulatory Visit: Payer: Self-pay

## 2020-05-06 ENCOUNTER — Encounter (HOSPITAL_COMMUNITY): Payer: Self-pay | Admitting: Emergency Medicine

## 2020-05-06 DIAGNOSIS — R109 Unspecified abdominal pain: Secondary | ICD-10-CM | POA: Diagnosis not present

## 2020-05-06 DIAGNOSIS — K625 Hemorrhage of anus and rectum: Secondary | ICD-10-CM

## 2020-05-06 DIAGNOSIS — Z8673 Personal history of transient ischemic attack (TIA), and cerebral infarction without residual deficits: Secondary | ICD-10-CM

## 2020-05-06 DIAGNOSIS — K529 Noninfective gastroenteritis and colitis, unspecified: Principal | ICD-10-CM | POA: Diagnosis present

## 2020-05-06 DIAGNOSIS — Z7982 Long term (current) use of aspirin: Secondary | ICD-10-CM

## 2020-05-06 DIAGNOSIS — E785 Hyperlipidemia, unspecified: Secondary | ICD-10-CM | POA: Diagnosis not present

## 2020-05-06 DIAGNOSIS — K644 Residual hemorrhoidal skin tags: Secondary | ICD-10-CM | POA: Diagnosis not present

## 2020-05-06 DIAGNOSIS — Z5321 Procedure and treatment not carried out due to patient leaving prior to being seen by health care provider: Secondary | ICD-10-CM | POA: Diagnosis not present

## 2020-05-06 DIAGNOSIS — K921 Melena: Secondary | ICD-10-CM | POA: Insufficient documentation

## 2020-05-06 DIAGNOSIS — K5900 Constipation, unspecified: Secondary | ICD-10-CM | POA: Diagnosis not present

## 2020-05-06 DIAGNOSIS — I700 Atherosclerosis of aorta: Secondary | ICD-10-CM | POA: Diagnosis not present

## 2020-05-06 DIAGNOSIS — Z20822 Contact with and (suspected) exposure to covid-19: Secondary | ICD-10-CM | POA: Diagnosis present

## 2020-05-06 DIAGNOSIS — K760 Fatty (change of) liver, not elsewhere classified: Secondary | ICD-10-CM | POA: Diagnosis not present

## 2020-05-06 DIAGNOSIS — Z96652 Presence of left artificial knee joint: Secondary | ICD-10-CM | POA: Diagnosis not present

## 2020-05-06 DIAGNOSIS — I10 Essential (primary) hypertension: Secondary | ICD-10-CM

## 2020-05-06 DIAGNOSIS — K922 Gastrointestinal hemorrhage, unspecified: Secondary | ICD-10-CM | POA: Diagnosis not present

## 2020-05-06 DIAGNOSIS — Z87891 Personal history of nicotine dependence: Secondary | ICD-10-CM

## 2020-05-06 DIAGNOSIS — T368X5A Adverse effect of other systemic antibiotics, initial encounter: Secondary | ICD-10-CM | POA: Diagnosis not present

## 2020-05-06 DIAGNOSIS — Z79899 Other long term (current) drug therapy: Secondary | ICD-10-CM | POA: Diagnosis not present

## 2020-05-06 DIAGNOSIS — I152 Hypertension secondary to endocrine disorders: Secondary | ICD-10-CM

## 2020-05-06 DIAGNOSIS — G2581 Restless legs syndrome: Secondary | ICD-10-CM | POA: Diagnosis not present

## 2020-05-06 DIAGNOSIS — Z9049 Acquired absence of other specified parts of digestive tract: Secondary | ICD-10-CM | POA: Diagnosis not present

## 2020-05-06 LAB — CBC WITH DIFFERENTIAL/PLATELET
Abs Immature Granulocytes: 0.03 10*3/uL (ref 0.00–0.07)
Basophils Absolute: 0 10*3/uL (ref 0.0–0.1)
Basophils Relative: 0 %
Eosinophils Absolute: 0.3 10*3/uL (ref 0.0–0.5)
Eosinophils Relative: 2 %
HCT: 45.4 % (ref 39.0–52.0)
Hemoglobin: 15.9 g/dL (ref 13.0–17.0)
Immature Granulocytes: 0 %
Lymphocytes Relative: 28 %
Lymphs Abs: 3 10*3/uL (ref 0.7–4.0)
MCH: 31.6 pg (ref 26.0–34.0)
MCHC: 35 g/dL (ref 30.0–36.0)
MCV: 90.3 fL (ref 80.0–100.0)
Monocytes Absolute: 0.9 10*3/uL (ref 0.1–1.0)
Monocytes Relative: 8 %
Neutro Abs: 6.7 10*3/uL (ref 1.7–7.7)
Neutrophils Relative %: 62 %
Platelets: 226 10*3/uL (ref 150–400)
RBC: 5.03 MIL/uL (ref 4.22–5.81)
RDW: 12.6 % (ref 11.5–15.5)
WBC: 10.9 10*3/uL — ABNORMAL HIGH (ref 4.0–10.5)
nRBC: 0 % (ref 0.0–0.2)

## 2020-05-06 LAB — COMPREHENSIVE METABOLIC PANEL
ALT: 31 U/L (ref 0–44)
ALT: 32 U/L (ref 0–44)
AST: 22 U/L (ref 15–41)
AST: 25 U/L (ref 15–41)
Albumin: 3.9 g/dL (ref 3.5–5.0)
Albumin: 4.2 g/dL (ref 3.5–5.0)
Alkaline Phosphatase: 61 U/L (ref 38–126)
Alkaline Phosphatase: 63 U/L (ref 38–126)
Anion gap: 13 (ref 5–15)
Anion gap: 12 (ref 5–15)
BUN: 12 mg/dL (ref 8–23)
BUN: 12 mg/dL (ref 8–23)
CO2: 22 mmol/L (ref 22–32)
CO2: 22 mmol/L (ref 22–32)
Calcium: 9.6 mg/dL (ref 8.9–10.3)
Calcium: 9.5 mg/dL (ref 8.9–10.3)
Chloride: 100 mmol/L (ref 98–111)
Chloride: 100 mmol/L (ref 98–111)
Creatinine, Ser: 0.77 mg/dL (ref 0.61–1.24)
Creatinine, Ser: 0.71 mg/dL (ref 0.61–1.24)
GFR, Estimated: >60 mL/min (ref >60)
GFR, Estimated: >60 mL/min (ref >60)
Glucose, Bld: 150 mg/dL — ABNORMAL HIGH (ref 70–99)
Glucose, Bld: 143 mg/dL — ABNORMAL HIGH (ref 70–99)
Potassium: 3.9 mmol/L (ref 3.5–5.1)
Potassium: 4.2 mmol/L (ref 3.5–5.1)
Sodium: 135 mmol/L (ref 135–145)
Sodium: 134 mmol/L — ABNORMAL LOW (ref 135–145)
Total Bilirubin: 1 mg/dL (ref 0.3–1.2)
Total Bilirubin: 1.5 mg/dL — ABNORMAL HIGH (ref 0.3–1.2)
Total Protein: 7.3 g/dL (ref 6.5–8.1)
Total Protein: 7.5 g/dL (ref 6.5–8.1)

## 2020-05-06 LAB — CBC
HCT: 44.5 % (ref 39.0–52.0)
Hemoglobin: 16 g/dL (ref 13.0–17.0)
MCH: 32.2 pg (ref 26.0–34.0)
MCHC: 36 g/dL (ref 30.0–36.0)
MCV: 89.5 fL (ref 80.0–100.0)
Platelets: 232 10*3/uL (ref 150–400)
RBC: 4.97 MIL/uL (ref 4.22–5.81)
RDW: 12.8 % (ref 11.5–15.5)
WBC: 9.4 10*3/uL (ref 4.0–10.5)
nRBC: 0 % (ref 0.0–0.2)

## 2020-05-06 LAB — HEMOGLOBIN: Hemoglobin: 15.3 g/dL (ref 13.0–17.0)

## 2020-05-06 LAB — RESP PANEL BY RT-PCR (FLU A&B, COVID) ARPGX2
Influenza A by PCR: NEGATIVE
Influenza B by PCR: NEGATIVE
SARS Coronavirus 2 by RT PCR: NEGATIVE

## 2020-05-06 LAB — URINALYSIS, COMPLETE (UACMP) WITH MICROSCOPIC
Bacteria, UA: NONE SEEN
Bilirubin Urine: NEGATIVE
Glucose, UA: NEGATIVE mg/dL
Hgb urine dipstick: NEGATIVE
Ketones, ur: NEGATIVE mg/dL
Leukocytes,Ua: NEGATIVE
Nitrite: NEGATIVE
Protein, ur: NEGATIVE mg/dL
Specific Gravity, Urine: 1.028 (ref 1.005–1.030)
Squamous Epithelial / HPF: NONE SEEN (ref 0–5)
pH: 6 (ref 5.0–8.0)

## 2020-05-06 LAB — PROTIME-INR
INR: 1 (ref 0.8–1.2)
Prothrombin Time: 12.3 seconds (ref 11.4–15.2)

## 2020-05-06 LAB — TYPE AND SCREEN
ABO/RH(D): A NEG
ABO/RH(D): A NEG
Antibody Screen: NEGATIVE
Antibody Screen: NEGATIVE

## 2020-05-06 LAB — LIPASE, BLOOD: Lipase: 28 U/L (ref 11–51)

## 2020-05-06 LAB — ABO/RH: ABO/RH(D): A NEG

## 2020-05-06 MED ORDER — OXYCODONE HCL 5 MG PO TABS: 5.0000 mg | ORAL_TABLET | ORAL | Status: DC | PRN

## 2020-05-06 MED ORDER — CIPROFLOXACIN IN D5W 400 MG/200ML IV SOLN
400.0000 mg | Freq: Once | INTRAVENOUS | Status: AC
  Administered 2020-05-06: 400 mg via INTRAVENOUS
  Filled 2020-05-06: qty 200

## 2020-05-06 MED ORDER — HYDROMORPHONE HCL 1 MG/ML IJ SOLN: 0.5000 mg | Freq: Once | INTRAMUSCULAR | Status: DC

## 2020-05-06 MED ORDER — PANTOPRAZOLE SODIUM 40 MG IV SOLR
40.0000 mg | Freq: Once | INTRAVENOUS | Status: AC
  Administered 2020-05-06: 40 mg via INTRAVENOUS
  Filled 2020-05-06: qty 40

## 2020-05-06 MED ORDER — AMLODIPINE BESYLATE 5 MG PO TABS
5.0000 mg | ORAL_TABLET | Freq: Every day | ORAL | Status: DC
  Administered 2020-05-07: 09:00:00 5 mg via ORAL
  Filled 2020-05-06: qty 1

## 2020-05-06 MED ORDER — IOHEXOL 350 MG/ML SOLN
100.0000 mL | Freq: Once | INTRAVENOUS | Status: AC | PRN
  Administered 2020-05-06: 100 mL via INTRAVENOUS

## 2020-05-06 MED ORDER — OXYCODONE-ACETAMINOPHEN 5-325 MG PO TABS: 1.0000 | ORAL_TABLET | Freq: Once | ORAL | Status: DC

## 2020-05-06 MED ORDER — LISINOPRIL 20 MG PO TABS
20.0000 mg | ORAL_TABLET | Freq: Every day | ORAL | Status: DC
  Administered 2020-05-07: 09:00:00 20 mg via ORAL
  Filled 2020-05-06: qty 1

## 2020-05-06 MED ORDER — ACETAMINOPHEN 325 MG PO TABS: 650.0000 mg | ORAL_TABLET | Freq: Four times a day (QID) | ORAL | Status: DC | PRN

## 2020-05-06 MED ORDER — ACETAMINOPHEN 650 MG RE SUPP: 650.0000 mg | Freq: Four times a day (QID) | RECTAL | Status: DC | PRN

## 2020-05-06 MED ORDER — METRONIDAZOLE 500 MG PO TABS: 500.0000 mg | ORAL_TABLET | Freq: Three times a day (TID) | ORAL | 0 refills | Status: DC

## 2020-05-06 MED ORDER — ADULT MULTIVITAMIN W/MINERALS CH
1.0000 | ORAL_TABLET | Freq: Every day | ORAL | Status: DC
  Administered 2020-05-07: 1 via ORAL
  Filled 2020-05-06: qty 1

## 2020-05-06 MED ORDER — METRONIDAZOLE IN NACL 5-0.79 MG/ML-% IV SOLN
500.0000 mg | Freq: Once | INTRAVENOUS | Status: AC
  Administered 2020-05-06: 500 mg via INTRAVENOUS
  Filled 2020-05-06: qty 100

## 2020-05-06 MED ORDER — CIPROFLOXACIN HCL 500 MG PO TABS: 500.0000 mg | ORAL_TABLET | Freq: Two times a day (BID) | ORAL | 0 refills | Status: DC

## 2020-05-06 MED ORDER — ONDANSETRON HCL 4 MG PO TABS: 4.0000 mg | ORAL_TABLET | Freq: Four times a day (QID) | ORAL | Status: DC | PRN

## 2020-05-06 MED ORDER — CIPROFLOXACIN IN D5W 400 MG/200ML IV SOLN
400.0000 mg | Freq: Two times a day (BID) | INTRAVENOUS | Status: DC
  Administered 2020-05-07: 02:00:00 400 mg via INTRAVENOUS
  Filled 2020-05-06: qty 200

## 2020-05-06 MED ORDER — METRONIDAZOLE IN NACL 5-0.79 MG/ML-% IV SOLN
500.0000 mg | Freq: Three times a day (TID) | INTRAVENOUS | Status: DC
  Administered 2020-05-06 – 2020-05-07 (×2): 500 mg via INTRAVENOUS
  Filled 2020-05-06 (×4): qty 100

## 2020-05-06 MED ORDER — LACTATED RINGERS IV BOLUS
1000.0000 mL | Freq: Once | INTRAVENOUS | Status: AC
  Administered 2020-05-06: 1000 mL via INTRAVENOUS

## 2020-05-06 MED ORDER — SODIUM CHLORIDE 0.9 % IV SOLN: INTRAVENOUS | Status: DC

## 2020-05-06 MED ORDER — ROSUVASTATIN CALCIUM 10 MG PO TABS
10.0000 mg | ORAL_TABLET | Freq: Every day | ORAL | Status: DC
  Administered 2020-05-07: 09:00:00 10 mg via ORAL
  Filled 2020-05-06: qty 1

## 2020-05-06 MED ORDER — ONDANSETRON HCL 4 MG/2ML IJ SOLN: 4.0000 mg | Freq: Four times a day (QID) | INTRAMUSCULAR | Status: DC | PRN

## 2020-05-06 MED ORDER — MORPHINE SULFATE (PF) 2 MG/ML IV SOLN: 2.0000 mg | INTRAVENOUS | Status: DC | PRN

## 2020-05-06 NOTE — ED Notes (Signed)
Admitting Provider at bedside. 

## 2020-05-06 NOTE — ED Notes (Signed)
Patient transported to CT 

## 2020-05-06 NOTE — ED Provider Notes (Signed)
[facility] Emergency Department Provider Note  ____________________________________________   First MD Initiated Contact with Patient 05/06/20 1059     (approximate)  I have reviewed the triage vital signs and the nursing notes.   HISTORY  Chief Complaint Rectal Bleeding and Abdominal Pain   HPI Sean Mullins is a 69 y.o. male with a past medical history of arthritis, anxiety, diverticuli, and TIA on Celebrex and aspirin but not otherwise anticoagulated who presents for assessment of generalized crampy abdominal pain associate with intermittent episodes of bright red blood per rectum started yesterday afternoon.  Patient states he has had bright red blood mixed with dark blue.  He states that prior to this he has never had any similar symptoms.  He denies any nausea or vomiting, chest pain, cough, shortness of breath, urinary symptoms, blood in his urine, back pain, extremity pain, headache, earache, sore throat, rash or other acute complaints.  Denies any history of GI bleeding.  No recent trauma or injuries.  Denies other significant NSAID use or daily EtOH use         Past Medical History:  Diagnosis Date  . Anxiety   . Arthritis   . Diverticula of colon   . TIA (transient ischemic attack)    November 2013    Patient Active Problem List   Diagnosis Date Noted  . Severe sleep apnea 09/13/2019  . Complex sleep apnea syndrome 08/16/2019  . Chronic intermittent hypoxia with obstructive sleep apnea 08/16/2019  . Migraine 05/15/2019  . Diverticulitis 02/28/2019  . BPPV (benign paroxysmal positional vertigo) 01/25/2016  . Essential hypertension 04/29/2015  . History of TIA (transient ischemic attack) 04/29/2015  . Hyperlipidemia 04/29/2015  . Diabetes ([place]) 04/29/2015  . Obese 05/17/2008  . OBSTRUCTIVE SLEEP APNEA, severe 05/17/2008  . RESTLESS LEG SYNDROME 05/17/2008  . Coronary atherosclerosis 05/17/2008  . Osteoarthritis 05/17/2008     Past Surgical History:  Procedure Laterality Date  . DUODENAL DIVERTICULECTOMY     2003  . KNEE ARTHROPLASTY Left     Prior to Admission medications   Medication Sig Start Date End Date Taking? Authorizing Provider  amLODipine (NORVASC) 5 MG tablet Take 1 tablet (5 mg total) by mouth daily. 05/21/19  Yes Burns, Claudina Lick, MD  aspirin EC 81 MG tablet Take 1 tablet (81 mg total) by mouth daily. 04/26/15  Yes Hillary Bow, MD  butalbital-acetaminophen-caffeine (FIORICET) [phone] MG tablet Take 1 tablet by mouth every 6 (six) hours as needed for headache. 06/15/19  Yes Dohmeier, Asencion Partridge, MD  celecoxib (CELEBREX) 200 MG capsule Take 1 capsule (200 mg total) by mouth 2 (two) times daily. 11/07/19  Yes Burns, Claudina Lick, MD  lisinopril (ZESTRIL) 20 MG tablet TAKE 1 TABLET BY MOUTH ONCE DAILY 02/20/20  Yes Burns, Claudina Lick, MD  Multiple Vitamin (MULTIVITAMIN WITH MINERALS) TABS tablet Take 1 tablet by mouth daily.   Yes [provider]  rosuvastatin (CRESTOR) 10 MG tablet TAKE 1 TABLET BY MOUTH ONCE DAILY 02/20/20  Yes Burns, Claudina Lick, MD  ciprofloxacin (CIPRO) 500 MG tablet Take 1 tablet (500 mg total) by mouth 2 (two) times daily for 7 days. 05/06/20 05/13/20  Lucrezia Starch, MD  metroNIDAZOLE (FLAGYL) 500 MG tablet Take 1 tablet (500 mg total) by mouth 3 (three) times daily for 7 days. 05/06/20 05/13/20  Lucrezia Starch, MD    Allergies Bystolic Courtney Heys hcl]  Family History  Problem Relation Age of Onset  . Transient ischemic attack Father  Social History Social History   Tobacco Use  . Smoking status: Former Smoker    Packs/day: 1.50    Years: 30.00    Pack years: 45.00    Quit date: 06/07/1996    Years since quitting: 23.9  . Smokeless tobacco: Never Used  Substance Use Topics  . Alcohol use: Yes    Comment: ocassional   . Drug use: Not on file    Review of Systems  Review of Systems  Constitutional: Negative for chills and fever.  HENT: Negative for sore  throat.   Eyes: Negative for pain.  Respiratory: Negative for cough and stridor.   Cardiovascular: Negative for chest pain.  Gastrointestinal: Positive for abdominal pain and blood in stool. Negative for vomiting.  Genitourinary: Negative for dysuria.  Musculoskeletal: Negative for myalgias.  Skin: Negative for rash.  Neurological: Negative for seizures, loss of consciousness and headaches.  Psychiatric/Behavioral: Negative for suicidal ideas.  All other systems reviewed and are negative.     ____________________________________________   PHYSICAL EXAM:  VITAL SIGNS: ED Triage Vitals  Enc Vitals Group     BP 05/06/20 0921 122/76     Pulse Rate 05/06/20 0921 73     Resp 05/06/20 0921 18     Temp 05/06/20 0921 98 F (36.7 C)     Temp src --      SpO2 05/06/20 0921 100 %     Weight 05/06/20 0922 280 lb (127 kg)     Height 05/06/20 0922 6\' 3"  (1.905 m)     Head Circumference --      Peak Flow --      Pain Score 05/06/20 0922 5     Pain Loc --      Pain Edu? --      Excl. in [place]? --    Vitals:   05/06/20 1220 05/06/20 1340  BP: 126/81 (!) 112/56  Pulse: (!) 56 61  Resp: 18 16  Temp:    SpO2: 93% 93%   Physical Exam Vitals and nursing note reviewed. Exam conducted with a chaperone present.  Constitutional:      Appearance: He is well-developed.  HENT:     Head: Normocephalic and atraumatic.  Eyes:     Conjunctiva/sclera: Conjunctivae normal.  Cardiovascular:     Rate and Rhythm: Normal rate and regular rhythm.     Heart sounds: No murmur heard.   Pulmonary:     Effort: Pulmonary effort is normal. No respiratory distress.     Breath sounds: Normal breath sounds.  Abdominal:     Palpations: Abdomen is soft.     Tenderness: There is generalized abdominal tenderness. There is no right CVA tenderness or left CVA tenderness.  Genitourinary:    Rectum: External hemorrhoid present. No mass or anal fissure.     Comments: No active bleeding visualized on  exam. Musculoskeletal:     Cervical back: Neck supple.  Skin:    General: Skin is warm and dry.     Capillary Refill: Capillary refill takes less than 2 seconds.  Neurological:     General: No focal deficit present.     Mental Status: He is alert.  Psychiatric:        Mood and Affect: Mood normal.      ____________________________________________   LABS (all labs ordered are listed, but only abnormal results are displayed)  Labs Reviewed  COMPREHENSIVE METABOLIC PANEL - Abnormal; Notable for the following components:      Result Value  Sodium 134 (*)    Glucose, Bld 143 (*)    Total Bilirubin 1.5 (*)    All other components within normal limits  URINALYSIS, COMPLETE (UACMP) WITH MICROSCOPIC - Abnormal; Notable for the following components:   Color, Urine STRAW (*)    APPearance CLEAR (*)    All other components within normal limits  C DIFFICILE QUICK SCREEN W PCR REFLEX  GASTROINTESTINAL PANEL BY PCR, STOOL (REPLACES STOOL CULTURE)  RESP PANEL BY RT-PCR (FLU A&B, COVID) ARPGX2  LIPASE, BLOOD  CBC  PROTIME-INR  TYPE AND SCREEN   ____________________________________________  EKG  Sinus rhythm with a ventricular rate of 60, normal axis, unremarkable intervals, no clear evidence of acute ischemia. ____________________________________________  RADIOLOGY   Official radiology report(s): CT Angio Abd/Pel W and/or Wo Contrast  Result Date: 05/06/2020 CLINICAL DATA:  Acute rectal bleeding and abdominal pain. EXAM: CT ANGIOGRAPHY ABDOMEN AND PELVIS WITH CONTRAST AND WITHOUT CONTRAST TECHNIQUE: Multidetector CT imaging of the abdomen and pelvis was performed using the standard protocol during bolus administration of intravenous contrast. Multiplanar reconstructed images and MIPs were obtained and reviewed to evaluate the vascular anatomy. CONTRAST:  166mL OMNIPAQUE IOHEXOL 350 MG/ML SOLN COMPARISON:  Report from a prior CT of the abdomen on 12/20/2001. FINDINGS: VASCULAR  Aorta: Normally patent with mild calcified plaque in the infrarenal aorta. No evidence of aortic aneurysm or dissection. Celiac: Normally patent. Distal branch vessels demonstrate normal patency. The left gastric artery has a separate origin off of the abdominal aorta just above the celiac trunk. SMA: Normally patent. Renals: Single right renal artery and 2 separate left renal arteries demonstrate normal patency. IMA: Normally patent. Inflow: Bilateral iliac arteries demonstrate normal patency without aneurysm or obstructive disease. Proximal Outflow: Normally patent bilateral common femoral arteries and femoral bifurcations. Veins: Venous phase demonstrates normal patency of venous structures in the abdomen and pelvis including mesenteric veins, portal vein, splenic vein, IVC, bilateral renal veins, iliac veins and common femoral veins. Review of the MIP images confirms the above findings. NON-VASCULAR Lower chest: No acute abnormality. Hepatobiliary: Hepatic steatosis without overt cirrhotic morphology of the liver. No focal masses. No biliary dilatation. Likely a few small gallstones in the gallbladder. Pancreas: Unremarkable. No pancreatic ductal dilatation or surrounding inflammatory changes. Spleen: Normal in size without focal abnormality. Adrenals/Urinary Tract: Adrenal glands are unremarkable. Nonobstructing small calculi in the upper pole collecting systems of both kidneys. No hydronephrosis or focal renal masses. Bladder is unremarkable. Stomach/Bowel: Inflammatory thickening is noted involving the descending colon over a diffuse segment beginning at roughly the mid descending colon and continuing into the proximal sigmoid. Findings most likely relate to colitis given long segment of colonic inflammation. There are some sparse diverticula in this segment as well. No evidence of focal abscess. An obvious focal mass is not seen by CT although oral contrast was not administered. No evidence of bowel  obstruction or free intraperitoneal air. No evidence of active bleeding into the gastrointestinal tract by arterial and venous phase CTA. Lymphatic: No enlarged abdominal or pelvic lymph nodes. Reproductive: Prostate is unremarkable. Other: Tiny right inguinal hernia contains fat. No abdominopelvic ascites. Musculoskeletal: No abdominal wall hernia or abnormality. No abdominopelvic ascites. IMPRESSION: 1. No evidence of significant mesenteric arterial occlusive disease. 2. Inflammatory thickening involving a diffuse segment of the descending colon and continuing into the proximal sigmoid colon. Findings are felt to be most likely related to colitis. An obvious focal mass is not seen by CT although oral contrast was not administered. 3. Hepatic  steatosis without overt cirrhotic morphology of the liver. 4. Cholelithiasis. 5. Nonobstructing small calculi in the upper pole collecting systems of both kidneys. Electronically Signed   By: Aletta Edouard M.D.   On: 05/06/2020 13:07    ____________________________________________   PROCEDURES  Procedure(s) performed (including Critical Care):  .1-3 Lead EKG Interpretation Performed by: Lucrezia Starch, MD Authorized by: Lucrezia Starch, MD     Interpretation: normal     ECG rate assessment: normal     Rhythm: sinus rhythm     Ectopy: none     Conduction: normal       ____________________________________________   INITIAL IMPRESSION / ASSESSMENT AND PLAN / ED COURSE        Patient presents with [place] history exam for assessment of some generalized abdominal pain associate with grossly bloody stools intermittent since yesterday afternoon.  On arrival patient is afebrile hemodynamically stable.  Exam as above remarkable for no rectal bleeding fissures but there is an external hemorrhoid.  CBC obtained shows no evidence of anemia or other significant derangements.  Lipase is unremarkable as no evidence of significant electrolyte or metabolic  derangements and LFTs are unremarkable.  Lipase is 28 not consistent with pancreatitis.  CT obtained shows no evidence of active arterial bleeding or mesenteric artery occlusion.  However patient is noted to have significant colitis on his distal and descending colon.  He has some gallstones but no evidence of acute cholecystitis and some incidental nonobstructing small calculi in his bilateral kidneys which I suspect are not related to his presentation.  Given bleeding reported in history as well as some pain and distribution of colitis and concern for possible ischemic colitis.  Discussed with Dr. Bonna Gains states she would be happy to see the patient either in the ED or in clinic if he was discharged.  However given concern for possible ischemic colitis I will plan to admit for observation to hospital service.  Will also cover with Cipro Flagyl for possible infectious colitis  ____________________________________________   FINAL CLINICAL IMPRESSION(S) / ED DIAGNOSES  Final diagnoses:  Colitis  Lower GI bleed    Medications  HYDROmorphone (DILAUDID) injection 0.5 mg (0 mg Intravenous Hold 05/06/20 1336)  oxyCODONE-acetaminophen (PERCOCET/ROXICET) 5-325 MG per tablet 1 tablet (0 tablets Oral Hold 05/06/20 1336)  metroNIDAZOLE (FLAGYL) IVPB 500 mg (has no administration in time range)  ciprofloxacin (CIPRO) IVPB 400 mg (has no administration in time range)  lactated ringers bolus 1,000 mL (0 mLs Intravenous Stopped 05/06/20 1337)  pantoprazole (PROTONIX) injection 40 mg (40 mg Intravenous Given 05/06/20 1126)  iohexol (OMNIPAQUE) 350 MG/ML injection 100 mL (100 mLs Intravenous Contrast Given 05/06/20 1137)     ED Discharge Orders         Ordered    metroNIDAZOLE (FLAGYL) 500 MG tablet  3 times daily        05/06/20 1356    ciprofloxacin (CIPRO) 500 MG tablet  2 times daily        05/06/20 1356           Note:  This document was prepared using Dragon voice recognition software and  may include unintentional dictation errors.   Lucrezia Starch, MD 05/06/20 [phone]

## 2020-05-06 NOTE — Consult Note (Signed)
Vonda Antigua, MD [address] [place], [place], [place], [address] [address], [place], [place], [place], [address] Phone: [phone]  Fax: [phone]  Consultation  Referring Provider:     Dr. Tamala Julian Primary Care Physician:  [name], MD Reason for Consultation:     Abdominal pain, rectal bleeding  Date of Admission:  05/06/2020 Date of Consultation:  05/06/2020         HPI:   Sean Mullins is a 69 y.o. male presents with abdominal pain, cramping, nonradiating, 5/10 associated with bright red blood per rectum for 1 day.  No history of similar symptoms.  No nausea or vomiting.  No melena.  Is on Celebrex at home.  I personally reviewed his past medical records, and under media there are scant reports of colonoscopies in the past.  2002 colonoscopy done for abdominal pain and bloating shows diverticulosis in the sigmoid and descending colon.  2008 colonoscopy with 2 subcentimeter polyps removed, diverticulosis reported in the descending and sigmoid colon.  Pathology report not available.  2010 colonoscopy states that patient has had previous sigmoid resection due to history of diverticulitis, and colonoscopy findings included diverticulosis in the sigmoid and descending colon, 2 mm polyp removed from the rectum, internal hemorrhoids and was otherwise normal.  Pathology showed hyperplastic polyp.  I was able to see pathology from sigmoid resection from 2003 under his labs that showed acute diverticulitis on the sigmoid colon segmental resection.  CTA in the ER shows no evidence of mesenteric arterial occlusive disease.  Inflammatory thickening is noted in the descending colon continuing to the proximal sigmoid colon.  Hemoglobin is normal on presentation at 16.  Patient states last episode of rectal bleeding was at 9 AM today and has not had any bowel movement since then.  Abdominal pain has resolved since being in the ER and receiving pain medications.  Past Medical  History:  Diagnosis Date  . Anxiety   . Arthritis   . Diverticula of colon   . TIA (transient ischemic attack)    November 2013    Past Surgical History:  Procedure Laterality Date  . DUODENAL DIVERTICULECTOMY     2003  . KNEE ARTHROPLASTY Left     Prior to Admission medications   Medication Sig Start Date End Date Taking? Authorizing Provider  amLODipine (NORVASC) 5 MG tablet Take 1 tablet (5 mg total) by mouth daily. 05/21/19  Yes Burns, Claudina Lick, MD  aspirin EC 81 MG tablet Take 1 tablet (81 mg total) by mouth daily. 04/26/15  Yes Hillary Bow, MD  butalbital-acetaminophen-caffeine (FIORICET) [phone] MG tablet Take 1 tablet by mouth every 6 (six) hours as needed for headache. 06/15/19  Yes Dohmeier, Asencion Partridge, MD  celecoxib (CELEBREX) 200 MG capsule Take 1 capsule (200 mg total) by mouth 2 (two) times daily. 11/07/19  Yes Burns, Claudina Lick, MD  lisinopril (ZESTRIL) 20 MG tablet TAKE 1 TABLET BY MOUTH ONCE DAILY 02/20/20  Yes Burns, Claudina Lick, MD  Multiple Vitamin (MULTIVITAMIN WITH MINERALS) TABS tablet Take 1 tablet by mouth daily.   Yes [provider]  rosuvastatin (CRESTOR) 10 MG tablet TAKE 1 TABLET BY MOUTH ONCE DAILY 02/20/20  Yes Burns, Claudina Lick, MD  ciprofloxacin (CIPRO) 500 MG tablet Take 1 tablet (500 mg total) by mouth 2 (two) times daily for 7 days. 05/06/20 05/13/20  Lucrezia Starch, MD  metroNIDAZOLE (FLAGYL) 500 MG tablet Take 1 tablet (500 mg total) by mouth 3 (three) times daily for  7 days. 05/06/20 05/13/20  Lucrezia Starch, MD    Family History  Problem Relation Age of Onset  . Transient ischemic attack Father      Social History   Tobacco Use  . Smoking status: Former Smoker    Packs/day: 1.50    Years: 30.00    Pack years: 45.00    Quit date: 06/07/1996    Years since quitting: 23.9  . Smokeless tobacco: Never Used  Substance Use Topics  . Alcohol use: Yes    Comment: ocassional   . Drug use: Not on file    Allergies as of 05/06/2020 - Review  Complete 05/06/2020  Allergen Reaction Noted  . Bystolic [nebivolol hcl] Other (See Comments) 08/28/2019    Review of Systems:    All systems reviewed and negative except where noted in HPI.   Physical Exam:  Vital signs in last 24 hours: Vitals:   05/06/20 1120 05/06/20 1130 05/06/20 1220 05/06/20 1340  BP: 120/76  126/81 (!) 112/56  Pulse: 66 64 (!) 56 61  Resp: 16 10 18 16   Temp:      SpO2: 95% 97% 93% 93%  Weight:      Height:         General:   Pleasant, cooperative in NAD Head:  Normocephalic and atraumatic. Eyes:   No icterus.   Conjunctiva pink. PERRLA. Ears:  Normal auditory acuity. Neck:  Supple; no masses or thyroidomegaly Lungs: Respirations even and unlabored. Lungs clear to auscultation bilaterally.   No wheezes, crackles, or rhonchi.  Abdomen:  Soft, nondistended, nontender. Normal bowel sounds. No appreciable masses or hepatomegaly.  No rebound or guarding.  Neurologic:  Alert and oriented x3;  grossly normal neurologically. Skin:  Intact without significant lesions or rashes. Cervical Nodes:  No significant cervical adenopathy. Psych:  Alert and cooperative. Normal affect.  LAB RESULTS: Recent Labs    05/06/20 0109 05/06/20 0925  WBC 10.9* 9.4  HGB 15.9 16.0  HCT 45.4 44.5  PLT 226 232   BMET Recent Labs    05/06/20 0109 05/06/20 0925  NA 135 134*  K 3.9 4.2  CL 100 100  CO2 22 22  GLUCOSE 150* 143*  BUN 12 12  CREATININE 0.77 0.71  CALCIUM 9.6 9.5   LFT Recent Labs    05/06/20 0925  PROT 7.5  ALBUMIN 4.2  AST 25  ALT 32  ALKPHOS 63  BILITOT 1.5*   PT/INR Recent Labs    05/06/20 1118  LABPROT 12.3  INR 1.0    STUDIES: CT Angio Abd/Pel W and/or Wo Contrast  Result Date: 05/06/2020 CLINICAL DATA:  Acute rectal bleeding and abdominal pain. EXAM: CT ANGIOGRAPHY ABDOMEN AND PELVIS WITH CONTRAST AND WITHOUT CONTRAST TECHNIQUE: Multidetector CT imaging of the abdomen and pelvis was performed using the standard protocol during  bolus administration of intravenous contrast. Multiplanar reconstructed images and MIPs were obtained and reviewed to evaluate the vascular anatomy. CONTRAST:  147mL OMNIPAQUE IOHEXOL 350 MG/ML SOLN COMPARISON:  Report from a prior CT of the abdomen on 12/20/2001. FINDINGS: VASCULAR Aorta: Normally patent with mild calcified plaque in the infrarenal aorta. No evidence of aortic aneurysm or dissection. Celiac: Normally patent. Distal branch vessels demonstrate normal patency. The left gastric artery has a separate origin off of the abdominal aorta just above the celiac trunk. SMA: Normally patent. Renals: Single right renal artery and 2 separate left renal arteries demonstrate normal patency. IMA: Normally patent. Inflow: Bilateral iliac arteries demonstrate normal patency without aneurysm  or obstructive disease. Proximal Outflow: Normally patent bilateral common femoral arteries and femoral bifurcations. Veins: Venous phase demonstrates normal patency of venous structures in the abdomen and pelvis including mesenteric veins, portal vein, splenic vein, IVC, bilateral renal veins, iliac veins and common femoral veins. Review of the MIP images confirms the above findings. NON-VASCULAR Lower chest: No acute abnormality. Hepatobiliary: Hepatic steatosis without overt cirrhotic morphology of the liver. No focal masses. No biliary dilatation. Likely a few small gallstones in the gallbladder. Pancreas: Unremarkable. No pancreatic ductal dilatation or surrounding inflammatory changes. Spleen: Normal in size without focal abnormality. Adrenals/Urinary Tract: Adrenal glands are unremarkable. Nonobstructing small calculi in the upper pole collecting systems of both kidneys. No hydronephrosis or focal renal masses. Bladder is unremarkable. Stomach/Bowel: Inflammatory thickening is noted involving the descending colon over a diffuse segment beginning at roughly the mid descending colon and continuing into the proximal sigmoid.  Findings most likely relate to colitis given long segment of colonic inflammation. There are some sparse diverticula in this segment as well. No evidence of focal abscess. An obvious focal mass is not seen by CT although oral contrast was not administered. No evidence of bowel obstruction or free intraperitoneal air. No evidence of active bleeding into the gastrointestinal tract by arterial and venous phase CTA. Lymphatic: No enlarged abdominal or pelvic lymph nodes. Reproductive: Prostate is unremarkable. Other: Tiny right inguinal hernia contains fat. No abdominopelvic ascites. Musculoskeletal: No abdominal wall hernia or abnormality. No abdominopelvic ascites. IMPRESSION: 1. No evidence of significant mesenteric arterial occlusive disease. 2. Inflammatory thickening involving a diffuse segment of the descending colon and continuing into the proximal sigmoid colon. Findings are felt to be most likely related to colitis. An obvious focal mass is not seen by CT although oral contrast was not administered. 3. Hepatic steatosis without overt cirrhotic morphology of the liver. 4. Cholelithiasis. 5. Nonobstructing small calculi in the upper pole collecting systems of both kidneys. Electronically Signed   By: Aletta Edouard M.D.   On: 05/06/2020 13:07      Impression / Plan:   Sean Mullins is a 69 y.o. y/o male with abdominal pain and rectal bleeding with CTA showing inflammatory changes extending from the descending to proximal sigmoid colon with no evidence of mesenteric arterial occlusive disease  Pattern of inflammation is suspicious for ischemic colitis, although no evidence of occlusion reported on CTA  Would recommend ruling out infectious causes with GI panel and C. Difficile  If rectal bleeding continues, we may need to consider colonoscopy as an inpatient.  If it resolves, would recommend resolution of inflammatory changes and then colonoscopy as an outpatient.  Patient has had at least 3  colonoscopies in his lifetime, as detailed in HPI and therefore suspicion for malignancy is low  If rectal bleeding worsens acutely, would recommend consultation with vascular surgery given the pattern of inflammation noted on CTA as well  Continue serial CBCs and transfuse as needed  No evidence of upper GI bleeding  No indication for urgent endoscopy at this time  Avoid NSAIDs  Avoid hypotension. IV fluid resuscitation as needed.  Thank you for involving me in the care of this patient.      LOS: 0 days   Virgel Manifold, MD  05/06/2020, 2:26 PM

## 2020-05-06 NOTE — ED Notes (Signed)
Patient in bathroom

## 2020-05-06 NOTE — ED Notes (Signed)
Registration informed this tech that pt had left.

## 2020-05-06 NOTE — Telephone Encounter (Signed)
Patient states he had diarrhea for several days, resolved yesterday. States yesterday from noon to 12:30am last night there was bright red blood in his stool, this morning it is black with dark red blood. States he went to ER yesterday but didn't stay because of the wait. The [facility] nurse recommended to go to the ED now. Patient said he is going to go to the ED.

## 2020-05-06 NOTE — ED Notes (Signed)
Patient resting on stretcher, denies pain at this time. Lights dimmed for patient comfort.

## 2020-05-06 NOTE — Telephone Encounter (Signed)
  Spouse calling to report patient has blood in stool, diarrhea, stomach cramping, weakness Call transferred to [facility] for immediate advice

## 2020-05-06 NOTE — H&P (Signed)
[facility] NAME: Sean Mullins    MR#:  [id number]  DATE OF BIRTH:  1951/01/13  DATE OF ADMISSION:  05/06/2020  PRIMARY CARE PHYSICIAN: [name], MD   REQUESTING/REFERRING PHYSICIAN: Dr. Hulan Saas  Patient coming from : home wife in the ER  CHIEF COMPLAINT:   Abdominal pain and blood he diarrhea since yesterday HISTORY OF PRESENT ILLNESS:  Sean Mullins  is a 69 y.o. male with a known history of diverticulitis status post sigmoid colectomy in 2003, history of colon polyps and diverticulosis noted on colonoscopy, hypertension, history of TIA comes to the emergency room after he started noticing abdominal pain lower. Patient said he felt constipated try to have a BM and there after several BMs were diarrheal stools with blood mixed in it. Denies any vomiting. Denies any fever.  ED course: in the ER appears hemodynamically stable. No bowel movement yet. Complains of some abdominal pain.  CT scan of the abdomen1. No evidence of significant mesenteric arterial occlusive disease. 2. Inflammatory thickening involving a diffuse segment of the descending colon and continuing into the proximal sigmoid colon. Findings are felt to be most likely related to colitis. An obvious focal mass is not seen by CT although oral contrast was not administered. 3. Hepatic steatosis without overt cirrhotic morphology of the liver. 4. Cholelithiasis. 5. Nonobstructing small calculi in the upper pole collecting systems of both kidneys.  White count 10.9-- 9.4 H&H stable. Will start patient on IV Cipro and Flagyl. Stool studies pending.  PAST MEDICAL HISTORY:   Past Medical History:  Diagnosis Date  . Anxiety   . Arthritis   . Diverticula of colon   . TIA (transient ischemic attack)    November 2013    PAST SURGICAL HISTOIRY:   Past Surgical History:  Procedure Laterality Date  . DUODENAL DIVERTICULECTOMY     2003  . KNEE  ARTHROPLASTY Left     SOCIAL HISTORY:   Social History   Tobacco Use  . Smoking status: Former Smoker    Packs/day: 1.50    Years: 30.00    Pack years: 45.00    Quit date: 06/07/1996    Years since quitting: 23.9  . Smokeless tobacco: Never Used  Substance Use Topics  . Alcohol use: Yes    Comment: ocassional     FAMILY HISTORY:   Family History  Problem Relation Age of Onset  . Transient ischemic attack Father     DRUG ALLERGIES:   Allergies  Allergen Reactions  . Bystolic [Nebivolol Hcl] Other (See Comments)    Severe fatigue    REVIEW OF SYSTEMS:  Review of Systems  Constitutional: Negative for chills, fever and weight loss.  HENT: Negative for ear discharge, ear pain and nosebleeds.   Eyes: Negative for blurred vision, pain and discharge.  Respiratory: Negative for sputum production, shortness of breath, wheezing and stridor.   Cardiovascular: Negative for chest pain, palpitations, orthopnea and PND.  Gastrointestinal: Positive for abdominal pain and blood in stool. Negative for diarrhea, nausea and vomiting.  Genitourinary: Negative for frequency and urgency.  Musculoskeletal: Negative for back pain and joint pain.  Neurological: Positive for weakness. Negative for sensory change, speech change and focal weakness.  Psychiatric/Behavioral: Negative for depression and hallucinations. The patient is not nervous/anxious.      MEDICATIONS AT HOME:   Prior to Admission medications   Medication Sig Start Date End Date Taking? Authorizing Provider  amLODipine (NORVASC) 5 MG  tablet Take 1 tablet (5 mg total) by mouth daily. 05/21/19  Yes Burns, Claudina Lick, MD  aspirin EC 81 MG tablet Take 1 tablet (81 mg total) by mouth daily. 04/26/15  Yes Hillary Bow, MD  butalbital-acetaminophen-caffeine (FIORICET) [phone] MG tablet Take 1 tablet by mouth every 6 (six) hours as needed for headache. 06/15/19  Yes Dohmeier, Asencion Partridge, MD  celecoxib (CELEBREX) 200 MG capsule Take 1  capsule (200 mg total) by mouth 2 (two) times daily. 11/07/19  Yes Burns, Claudina Lick, MD  lisinopril (ZESTRIL) 20 MG tablet TAKE 1 TABLET BY MOUTH ONCE DAILY 02/20/20  Yes Burns, Claudina Lick, MD  Multiple Vitamin (MULTIVITAMIN WITH MINERALS) TABS tablet Take 1 tablet by mouth daily.   Yes [provider]  rosuvastatin (CRESTOR) 10 MG tablet TAKE 1 TABLET BY MOUTH ONCE DAILY 02/20/20  Yes Burns, Claudina Lick, MD  ciprofloxacin (CIPRO) 500 MG tablet Take 1 tablet (500 mg total) by mouth 2 (two) times daily for 7 days. 05/06/20 05/13/20  Lucrezia Starch, MD  metroNIDAZOLE (FLAGYL) 500 MG tablet Take 1 tablet (500 mg total) by mouth 3 (three) times daily for 7 days. 05/06/20 05/13/20  Lucrezia Starch, MD      VITAL SIGNS:  Blood pressure 122/61, pulse 64, temperature 98 F (36.7 C), resp. rate 18, height 6\' 3"  (1.905 m), weight 127 kg, SpO2 97 %.  PHYSICAL EXAMINATION:  GENERAL:  69 y.o.-year-old patient lying in the bed with no acute distress. Obese EYES: Pupils equal, round, reactive to light and accommodation. No scleral icterus.  HEENT: Head atraumatic, normocephalic. Oropharynx and nasopharynx clear.  NECK:  Supple, no jugular venous distention. No thyroid enlargement, no tenderness.  LUNGS: Normal breath sounds bilaterally, no wheezing, rales,rhonchi or crepitation. No use of accessory muscles of respiration.  CARDIOVASCULAR: S1, S2 normal. No murmurs, rubs, or gallops.  ABDOMEN: Soft, diffusely tender, nondistended. Abdominal obesity  bowel sounds present. No organomegaly or mass.  EXTREMITIES: No pedal edema, cyanosis, or clubbing.  NEUROLOGIC: Cranial nerves II through XII are intact. Muscle strength 5/5 in all extremities. Sensation intact. Gait not checked.  PSYCHIATRIC:  patient is alert and oriented x 3.  SKIN: No obvious rash, lesion, or ulcer.   LABORATORY PANEL:   CBC Recent Labs  Lab 05/06/20 0925  WBC 9.4  HGB 16.0  HCT 44.5  PLT 232    ------------------------------------------------------------------------------------------------------------------  Chemistries  Recent Labs  Lab 05/06/20 0925  NA 134*  K 4.2  CL 100  CO2 22  GLUCOSE 143*  BUN 12  CREATININE 0.71  CALCIUM 9.5  AST 25  ALT 32  ALKPHOS 63  BILITOT 1.5*   ------------------------------------------------------------------------------------------------------------------  Cardiac Enzymes No results for input(s): TROPONINI in the last 168 hours. ------------------------------------------------------------------------------------------------------------------  RADIOLOGY:  CT Angio Abd/Pel W and/or Wo Contrast  Result Date: 05/06/2020 CLINICAL DATA:  Acute rectal bleeding and abdominal pain. EXAM: CT ANGIOGRAPHY ABDOMEN AND PELVIS WITH CONTRAST AND WITHOUT CONTRAST TECHNIQUE: Multidetector CT imaging of the abdomen and pelvis was performed using the standard protocol during bolus administration of intravenous contrast. Multiplanar reconstructed images and MIPs were obtained and reviewed to evaluate the vascular anatomy. CONTRAST:  139mL OMNIPAQUE IOHEXOL 350 MG/ML SOLN COMPARISON:  Report from a prior CT of the abdomen on 12/20/2001. FINDINGS: VASCULAR Aorta: Normally patent with mild calcified plaque in the infrarenal aorta. No evidence of aortic aneurysm or dissection. Celiac: Normally patent. Distal branch vessels demonstrate normal patency. The left gastric artery has a separate origin off of the abdominal  aorta just above the celiac trunk. SMA: Normally patent. Renals: Single right renal artery and 2 separate left renal arteries demonstrate normal patency. IMA: Normally patent. Inflow: Bilateral iliac arteries demonstrate normal patency without aneurysm or obstructive disease. Proximal Outflow: Normally patent bilateral common femoral arteries and femoral bifurcations. Veins: Venous phase demonstrates normal patency of venous structures in the abdomen  and pelvis including mesenteric veins, portal vein, splenic vein, IVC, bilateral renal veins, iliac veins and common femoral veins. Review of the MIP images confirms the above findings. NON-VASCULAR Lower chest: No acute abnormality. Hepatobiliary: Hepatic steatosis without overt cirrhotic morphology of the liver. No focal masses. No biliary dilatation. Likely a few small gallstones in the gallbladder. Pancreas: Unremarkable. No pancreatic ductal dilatation or surrounding inflammatory changes. Spleen: Normal in size without focal abnormality. Adrenals/Urinary Tract: Adrenal glands are unremarkable. Nonobstructing small calculi in the upper pole collecting systems of both kidneys. No hydronephrosis or focal renal masses. Bladder is unremarkable. Stomach/Bowel: Inflammatory thickening is noted involving the descending colon over a diffuse segment beginning at roughly the mid descending colon and continuing into the proximal sigmoid. Findings most likely relate to colitis given long segment of colonic inflammation. There are some sparse diverticula in this segment as well. No evidence of focal abscess. An obvious focal mass is not seen by CT although oral contrast was not administered. No evidence of bowel obstruction or free intraperitoneal air. No evidence of active bleeding into the gastrointestinal tract by arterial and venous phase CTA. Lymphatic: No enlarged abdominal or pelvic lymph nodes. Reproductive: Prostate is unremarkable. Other: Tiny right inguinal hernia contains fat. No abdominopelvic ascites. Musculoskeletal: No abdominal wall hernia or abnormality. No abdominopelvic ascites. IMPRESSION: 1. No evidence of significant mesenteric arterial occlusive disease. 2. Inflammatory thickening involving a diffuse segment of the descending colon and continuing into the proximal sigmoid colon. Findings are felt to be most likely related to colitis. An obvious focal mass is not seen by CT although oral contrast was  not administered. 3. Hepatic steatosis without overt cirrhotic morphology of the liver. 4. Cholelithiasis. 5. Nonobstructing small calculi in the upper pole collecting systems of both kidneys. Electronically Signed   By: Aletta Edouard M.D.   On: 05/06/2020 13:07    EKG:    IMPRESSION AND PLAN:   Sean Mullins  is a 69 y.o. male with a known history of diverticulitis status post sigmoid colectomy in 2003, history of colon polyps and diverticulosis noted on colonoscopy, hypertension, history of TIA comes to the emergency room after he started noticing abdominal pain lower. Patient said he felt constipated try to have a BM and there after several BMs were diarrheal stools with blood mixed in it  Acute colitis -- patient presented with several diarrheal stools mixed with blood -- CT abdomen showed inflammatory thickening involving diffuse segment of the descending colon and continuum into proximal segment colon -- clear liquid -IV and PO PRN pain meds -- continue IV Cipro and Flagyl -- monitor H&H Q 12 -- G.I. consultation with Dr. Bonna Gains message sent -- stool studies ordered by ER physician pending -- hold blood thinners NSAIDs  Hypertension -- resume amlodipine, lisinopril  Hyperlipidemia -- continue Crestor  DVT prophylaxis --SCD  --due to G.I. bleed avoid blood thinners  Family Communication : wife in the ER Consults : G.I. Dr. Bonna Gains Code Status : full DVT prophylaxis : SCD Status is: Inpatient  Remains inpatient appropriate because:IV treatments appropriate due to intensity of illness or inability to take PO  Dispo: The patient is from: Home              Anticipated d/c is to: Home              Anticipated d/c date is: 2 days              Patient currently is not medically stable to d/c.       TOTAL TIME TAKING CARE OF THIS PATIENT: 45 minutes.    Fritzi Mandes M.D  [facility]     CC: Primary care physician; [name], MD

## 2020-05-06 NOTE — ED Triage Notes (Signed)
Pt sts he started having abd cramping and bright red stools earlier today.  Pt st's he has had approx 15 blood stools

## 2020-05-06 NOTE — ED Triage Notes (Signed)
Pt comes via POV from home with c/o several bloody stools. Pt states he has had severe belly pain. Pt states he is on blood thinners.  Pt states he went to [facility] last night but left due to wait time. Pt states he had more episodes this am and was advised by PCP to come to ED.

## 2020-05-07 DIAGNOSIS — K529 Noninfective gastroenteritis and colitis, unspecified: Secondary | ICD-10-CM | POA: Diagnosis not present

## 2020-05-07 LAB — GASTROINTESTINAL PANEL BY PCR, STOOL (REPLACES STOOL CULTURE)

## 2020-05-07 LAB — HEMOGLOBIN: Hemoglobin: 14.6 g/dL (ref 13.0–17.0)

## 2020-05-07 LAB — C DIFFICILE QUICK SCREEN W PCR REFLEX
C Diff antigen: NEGATIVE
C Diff interpretation: NOT DETECTED
C Diff toxin: NEGATIVE

## 2020-05-07 MED ORDER — SODIUM CHLORIDE 0.9 % IV SOLN
1.0000 g | INTRAVENOUS | Status: DC
  Administered 2020-05-07: 10:00:00 1 g via INTRAVENOUS
  Filled 2020-05-07 (×2): qty 10

## 2020-05-07 MED ORDER — DIPHENHYDRAMINE HCL 50 MG/ML IJ SOLN
25.0000 mg | Freq: Once | INTRAMUSCULAR | Status: AC
  Administered 2020-05-07: 04:00:00 25 mg via INTRAVENOUS
  Filled 2020-05-07: qty 1

## 2020-05-07 MED ORDER — METRONIDAZOLE 500 MG PO TABS
500.0000 mg | ORAL_TABLET | Freq: Three times a day (TID) | ORAL | Status: DC
  Administered 2020-05-07: 500 mg via ORAL
  Filled 2020-05-07: qty 1

## 2020-05-07 NOTE — Progress Notes (Signed)
Pt discharged to home. IV removed without complication.  AVS given to pt and explained with no further questions.  All belongings at bedside taken with pt.

## 2020-05-07 NOTE — Discharge Summary (Addendum)
Physician Discharge Summary  Sean Mullins JME:[id number] DOB: March 10, 1951 DOA: 05/06/2020  PCP: [name], MD  Admit date: 05/06/2020 Discharge date: 05/07/2020  Admitted From: home Disposition:  home  Recommendations for Outpatient Follow-up:  1. Follow up with PCP in 1-2 weeks 2. Please obtain BMP/CBC in one week 3. Please follow up with GI in 2-4 weeks  4. Please address need for Celebrex given NSAID effects on GI ulcers and inflammation, and patient had bloody diarrhea with colitis.  Recommend avoiding it if possible.  Home Health: No  Equipment/Devices: None   Discharge Condition: Stable  CODE STATUS: Full  Diet recommendation: Regular - start slow, soft easy to digest doods and advance as tolerated  Discharge Diagnoses: Active Problems:   Primary hypertension   Colitis    Summary of HPI and Hospital Course:   HPI on admission by Dr. Posey Pronto: "Sean Mullins  is a 69 y.o. male with a known history of diverticulitis status post sigmoid colectomy in 2003, history of colon polyps and diverticulosis noted on colonoscopy, hypertension, history of TIA comes to the emergency room after he started noticing abdominal pain lower. Patient said he felt constipated try to have a BM and there after several BMs were diarrheal stools with blood mixed in it. Denies any vomiting. Denies any fever.  ED course: in the ER appears hemodynamically stable. No bowel movement yet. Complains of some abdominal pain.  CT scan of the abdomen1. No evidence of significant mesenteric arterial occlusive disease. 2. Inflammatory thickening involving a diffuse segment of the descending colon and continuing into the proximal sigmoid colon. Findings are felt to be most likely related to colitis. An obvious focal mass is not seen by CT although oral contrast was not administered. 3. Hepatic steatosis without overt cirrhotic morphology of the liver. 4. Cholelithiasis. 5. Nonobstructing small  calculi in the upper pole collecting systems of both kidneys.  White count 10.9-- 9.4 H&H stable. Will start patient on IV Cipro and Flagyl. Stool studies pending."   GI pathogen panel and C diff were negative.  Diarrhea resolved and no further bleeding.  Hemoglobin stable. GI was consulted.  Feel this is inflammatory colitis.  Imaging was negative for mesenteric ischemia.  At time of presentation, greater than 2 midnight stay was anticipated, however patient clinically improved more clinically quickly than expected with resolution of abdominal pain and diarrhea, and is tolerating diet.  He was treated with empiric antibiotics during admission, but given negative stool studies, no leukocytosis and no fevers, additional antibiotics at discharge are not needed.    Patient is to follow up with GI in 2-4 weeks for colonoscopy to better assess inflammation seen on CT, once recovered from acute illness.      Discharge Instructions   Discharge Instructions    Call MD for:   Complete by: As directed    Recurrence of abdominal pain or bloody diarrhea, or worsening abdominal pain.   Call MD for:  extreme fatigue   Complete by: As directed    Call MD for:  persistant dizziness or light-headedness   Complete by: As directed    Call MD for:  persistant nausea and vomiting   Complete by: As directed    Call MD for:  severe uncontrolled pain   Complete by: As directed    Call MD for:  temperature >100.4   Complete by: As directed    Diet - low sodium heart healthy   Complete by: As directed    Discharge  instructions   Complete by: As directed    Advance diet slowly, and as tolerated.  Start with softer, easy to digest foods (lower fiber foods). If anything causes pain, avoid for a couple days before trying again.  You do not need to continue with antibiotics after discharge since panel looking for infection was completely negative, no fevers, no high white count.  If you start having fevers,  or symptoms worsen again, please call your primary care doctor.  Gastroenterology recommends outpatient colonoscopy once you've recovered from this acute illness.   They feel your Celebrex could potentially contribute to the colitis and bleeding you had.  I would recommend discussing this with your primary care (or doctor that prescribes Celebrex for you), so determine if still needed, and if another option.  All anti-inflammatory medications can worsen GI ulcers or inflammation.  Would avoid using Celebrex if you can do so.   Increase activity slowly   Complete by: As directed      Allergies as of 05/07/2020      Reactions   Ciprofloxacin Hcl Anaphylaxis   Bystolic [nebivolol Hcl] Other (See Comments)   Severe fatigue      Medication List    TAKE these medications   amLODipine 5 MG tablet Commonly known as: NORVASC Take 1 tablet (5 mg total) by mouth daily.   aspirin EC 81 MG tablet Take 1 tablet (81 mg total) by mouth daily.   butalbital-acetaminophen-caffeine 50-325-40 MG tablet Commonly known as: FIORICET Take 1 tablet by mouth every 6 (six) hours as needed for headache.   celecoxib 200 MG capsule Commonly known as: CELEBREX Take 1 capsule (200 mg total) by mouth 2 (two) times daily.   lisinopril 20 MG tablet Commonly known as: ZESTRIL TAKE 1 TABLET BY MOUTH ONCE DAILY   multivitamin with minerals Tabs tablet Take 1 tablet by mouth daily.   rosuvastatin 10 MG tablet Commonly known as: CRESTOR TAKE 1 TABLET BY MOUTH ONCE DAILY       Follow-up Information    Schedule an appointment as soon as possible for a visit  with Virgel Manifold, MD.   Specialty: Gastroenterology Contact information: [place] [address] [phone]              Allergies  Allergen Reactions  . Ciprofloxacin Hcl Anaphylaxis  . Bystolic [Nebivolol Hcl] Other (See Comments)    Severe fatigue    Consultations:  gastroenterology     Procedures/Studies: CT Angio Abd/Pel W and/or Wo Contrast  Result Date: 05/06/2020 CLINICAL DATA:  Acute rectal bleeding and abdominal pain. EXAM: CT ANGIOGRAPHY ABDOMEN AND PELVIS WITH CONTRAST AND WITHOUT CONTRAST TECHNIQUE: Multidetector CT imaging of the abdomen and pelvis was performed using the standard protocol during bolus administration of intravenous contrast. Multiplanar reconstructed images and MIPs were obtained and reviewed to evaluate the vascular anatomy. CONTRAST:  128mL OMNIPAQUE IOHEXOL 350 MG/ML SOLN COMPARISON:  Report from a prior CT of the abdomen on 12/20/2001. FINDINGS: VASCULAR Aorta: Normally patent with mild calcified plaque in the infrarenal aorta. No evidence of aortic aneurysm or dissection. Celiac: Normally patent. Distal branch vessels demonstrate normal patency. The left gastric artery has a separate origin off of the abdominal aorta just above the celiac trunk. SMA: Normally patent. Renals: Single right renal artery and 2 separate left renal arteries demonstrate normal patency. IMA: Normally patent. Inflow: Bilateral iliac arteries demonstrate normal patency without aneurysm or obstructive disease. Proximal Outflow: Normally patent bilateral common femoral arteries and femoral  bifurcations. Veins: Venous phase demonstrates normal patency of venous structures in the abdomen and pelvis including mesenteric veins, portal vein, splenic vein, IVC, bilateral renal veins, iliac veins and common femoral veins. Review of the MIP images confirms the above findings. NON-VASCULAR Lower chest: No acute abnormality. Hepatobiliary: Hepatic steatosis without overt cirrhotic morphology of the liver. No focal masses. No biliary dilatation. Likely a few small gallstones in the gallbladder. Pancreas: Unremarkable. No pancreatic ductal dilatation or surrounding inflammatory changes. Spleen: Normal in size without focal abnormality. Adrenals/Urinary Tract: Adrenal glands are unremarkable.  Nonobstructing small calculi in the upper pole collecting systems of both kidneys. No hydronephrosis or focal renal masses. Bladder is unremarkable. Stomach/Bowel: Inflammatory thickening is noted involving the descending colon over a diffuse segment beginning at roughly the mid descending colon and continuing into the proximal sigmoid. Findings most likely relate to colitis given long segment of colonic inflammation. There are some sparse diverticula in this segment as well. No evidence of focal abscess. An obvious focal mass is not seen by CT although oral contrast was not administered. No evidence of bowel obstruction or free intraperitoneal air. No evidence of active bleeding into the gastrointestinal tract by arterial and venous phase CTA. Lymphatic: No enlarged abdominal or pelvic lymph nodes. Reproductive: Prostate is unremarkable. Other: Tiny right inguinal hernia contains fat. No abdominopelvic ascites. Musculoskeletal: No abdominal wall hernia or abnormality. No abdominopelvic ascites. IMPRESSION: 1. No evidence of significant mesenteric arterial occlusive disease. 2. Inflammatory thickening involving a diffuse segment of the descending colon and continuing into the proximal sigmoid colon. Findings are felt to be most likely related to colitis. An obvious focal mass is not seen by CT although oral contrast was not administered. 3. Hepatic steatosis without overt cirrhotic morphology of the liver. 4. Cholelithiasis. 5. Nonobstructing small calculi in the upper pole collecting systems of both kidneys. Electronically Signed   By: Aletta Edouard M.D.   On: 05/06/2020 13:07       Subjective: Patient seen today with wife at bedside.  He reports feel much better.  Is asking to try regular foods.  Has no more abdominal pain.  Diarrhea and bleeding have completely resolved.  He hopes to go home today.  Patient's diet was advanced and several hours later he did not have any exacerbation of abdominal pain or  diarrhea.   Discharge Exam: Vitals:   05/07/20 1110 05/07/20 1530  BP: 130/73 (!) 113/51  Pulse: (!) 56 63  Resp: 18 18  Temp: 97.8 F (36.6 C) 97.8 F (36.6 C)  SpO2: 96% 94%   Vitals:   05/07/20 0305 05/07/20 0723 05/07/20 1110 05/07/20 1530  BP: (!) 129/58 (!) 128/104 130/73 (!) 113/51  Pulse: 62 (!) 58 (!) 56 63  Resp:  19 18 18   Temp: 98.3 F (36.8 C) (!) 97.5 F (36.4 C) 97.8 F (36.6 C) 97.8 F (36.6 C)  TempSrc: Oral Oral Oral Oral  SpO2: 94% 98% 96% 94%  Weight:      Height:        General: Pt is alert, awake, not in acute distress Cardiovascular: RRR, S1/S2 +, no rubs, no gallops Respiratory: CTA bilaterally, no wheezing, no rhonchi Abdominal: Soft, NT, ND, bowel sounds +, well-healed midline incisional scar Extremities: no edema, no cyanosis    The results of significant diagnostics from this hospitalization (including imaging, microbiology, ancillary and laboratory) are listed below for reference.     Microbiology: Recent Results (from the past 240 hour(s))  C Difficile Quick Screen  w PCR reflex     Status: None   Collection Time: 05/06/20  1:12 PM   Specimen: STOOL  Result Value Ref Range Status   C Diff antigen NEGATIVE NEGATIVE Final   C Diff toxin NEGATIVE NEGATIVE Final   C Diff interpretation No C. difficile detected.  Final    Comment: Performed at [facility], [address] [place], [place] [address]  Gastrointestinal Panel by PCR , Stool     Status: None   Collection Time: 05/06/20  1:12 PM   Specimen: Stool  Result Value Ref Range Status   Campylobacter species NOT DETECTED NOT DETECTED Final   Plesimonas shigelloides NOT DETECTED NOT DETECTED Final   Salmonella species NOT DETECTED NOT DETECTED Final   Yersinia enterocolitica NOT DETECTED NOT DETECTED Final   Vibrio species NOT DETECTED NOT DETECTED Final   Vibrio cholerae NOT DETECTED NOT DETECTED Final   Enteroaggregative E coli (EAEC) NOT DETECTED NOT DETECTED Final    Enteropathogenic E coli (EPEC) NOT DETECTED NOT DETECTED Final   Enterotoxigenic E coli (ETEC) NOT DETECTED NOT DETECTED Final   Shiga like toxin producing E coli (STEC) NOT DETECTED NOT DETECTED Final   Shigella/Enteroinvasive E coli (EIEC) NOT DETECTED NOT DETECTED Final   Cryptosporidium NOT DETECTED NOT DETECTED Final   Cyclospora cayetanensis NOT DETECTED NOT DETECTED Final   Entamoeba histolytica NOT DETECTED NOT DETECTED Final   Giardia lamblia NOT DETECTED NOT DETECTED Final   Adenovirus F40/41 NOT DETECTED NOT DETECTED Final   Astrovirus NOT DETECTED NOT DETECTED Final   Norovirus GI/GII NOT DETECTED NOT DETECTED Final   Rotavirus A NOT DETECTED NOT DETECTED Final   Sapovirus (I, II, IV, and V) NOT DETECTED NOT DETECTED Final    Comment: Performed at [facility], [address] [place], [place] [address]  Resp Panel by RT-PCR (Flu A&B, Covid) Nasopharyngeal Swab     Status: None   Collection Time: 05/06/20  2:17 PM   Specimen: Nasopharyngeal Swab; Nasopharyngeal(NP) swabs in vial transport medium  Result Value Ref Range Status   SARS Coronavirus 2 by RT PCR NEGATIVE NEGATIVE Final    Comment: (NOTE) SARS-CoV-2 target nucleic acids are NOT DETECTED.  The SARS-CoV-2 RNA is generally detectable in upper respiratory specimens during the acute phase of infection. The lowest concentration of SARS-CoV-2 viral copies this assay can detect is 138 copies/mL. A negative result does not preclude SARS-Cov-2 infection and should not be used as the sole basis for treatment or other patient management decisions. A negative result may occur with  improper specimen collection/handling, submission of specimen other than nasopharyngeal swab, presence of viral mutation(s) within the areas targeted by this assay, and inadequate number of viral copies(<138 copies/mL). A negative result must be combined with clinical observations, patient history, and epidemiological information. The  expected result is Negative.  Fact Sheet for Patients:  EntrepreneurPulse.com.au  Fact Sheet for Healthcare Providers:  IncredibleEmployment.be  This test is no t yet approved or cleared by the [place] FDA and  has been authorized for detection and/or diagnosis of SARS-CoV-2 by FDA under an Emergency Use Authorization (EUA). This EUA will remain  in effect (meaning this test can be used) for the duration of the COVID-19 declaration under Section 564(b)(1) of the Act, 21 U.S.C.section 360bbb-3(b)(1), unless the authorization is terminated  or revoked sooner.       Influenza A by PCR NEGATIVE NEGATIVE Final   Influenza B by PCR NEGATIVE NEGATIVE Final    Comment: (  NOTE) The Xpert Xpress SARS-CoV-2/FLU/RSV plus assay is intended as an aid in the diagnosis of influenza from Nasopharyngeal swab specimens and should not be used as a sole basis for treatment. Nasal washings and aspirates are unacceptable for Xpert Xpress SARS-CoV-2/FLU/RSV testing.  Fact Sheet for Patients: EntrepreneurPulse.com.au  Fact Sheet for Healthcare Providers: IncredibleEmployment.be  This test is not yet approved or cleared by the [place] FDA and has been authorized for detection and/or diagnosis of SARS-CoV-2 by FDA under an Emergency Use Authorization (EUA). This EUA will remain in effect (meaning this test can be used) for the duration of the COVID-19 declaration under Section 564(b)(1) of the Act, 21 U.S.C. section 360bbb-3(b)(1), unless the authorization is terminated or revoked.  Performed at [facility], [address] [place], [place] [address]      Labs: BNP (last 3 results) No results for input(s): BNP in the last 8760 hours. Basic Metabolic Panel: Recent Labs  Lab 05/06/20 0109 05/06/20 0925  NA 135 134*  K 3.9 4.2  CL 100 100  CO2 22 22  GLUCOSE 150* 143*  BUN 12 12  CREATININE 0.77  0.71  CALCIUM 9.6 9.5   Liver Function Tests: Recent Labs  Lab 05/06/20 0109 05/06/20 0925  AST 22 25  ALT 31 32  ALKPHOS 61 63  BILITOT 1.0 1.5*  PROT 7.3 7.5  ALBUMIN 3.9 4.2   Recent Labs  Lab 05/06/20 0925  LIPASE 28   No results for input(s): AMMONIA in the last 168 hours. CBC: Recent Labs  Lab 05/06/20 0109 05/06/20 0925 05/06/20 1808 05/07/20 0754  WBC 10.9* 9.4  --   --   NEUTROABS 6.7  --   --   --   HGB 15.9 16.0 15.3 14.6  HCT 45.4 44.5  --   --   MCV 90.3 89.5  --   --   PLT 226 232  --   --    Cardiac Enzymes: No results for input(s): CKTOTAL, CKMB, CKMBINDEX, TROPONINI in the last 168 hours. BNP: Invalid input(s): POCBNP CBG: No results for input(s): GLUCAP in the last 168 hours. D-Dimer No results for input(s): DDIMER in the last 72 hours. Hgb A1c No results for input(s): HGBA1C in the last 72 hours. Lipid Profile No results for input(s): CHOL, HDL, LDLCALC, TRIG, CHOLHDL, LDLDIRECT in the last 72 hours. Thyroid function studies No results for input(s): TSH, T4TOTAL, T3FREE, THYROIDAB in the last 72 hours.  Invalid input(s): FREET3 Anemia work up No results for input(s): VITAMINB12, FOLATE, FERRITIN, TIBC, IRON, RETICCTPCT in the last 72 hours. Urinalysis    Component Value Date/Time   COLORURINE STRAW (A) 05/06/2020 1118   APPEARANCEUR CLEAR (A) 05/06/2020 1118   LABSPEC 1.028 05/06/2020 1118   PHURINE 6.0 05/06/2020 1118   GLUCOSEU NEGATIVE 05/06/2020 1118   [place] 05/06/2020 1118   [place] 05/06/2020 1118   KETONESUR NEGATIVE 05/06/2020 1118   PROTEINUR NEGATIVE 05/06/2020 1118   NITRITE NEGATIVE 05/06/2020 1118   LEUKOCYTESUR NEGATIVE 05/06/2020 1118   Sepsis Labs Invalid input(s): PROCALCITONIN,  WBC,  LACTICIDVEN Microbiology Recent Results (from the past 240 hour(s))  C Difficile Quick Screen w PCR reflex     Status: None   Collection Time: 05/06/20  1:12 PM   Specimen: STOOL  Result Value Ref Range  Status   C Diff antigen NEGATIVE NEGATIVE Final   C Diff toxin NEGATIVE NEGATIVE Final   C Diff interpretation No C. difficile detected.  Final    Comment:  Performed at [facility], [address] [place], [place] [address]  Gastrointestinal Panel by PCR , Stool     Status: None   Collection Time: 05/06/20  1:12 PM   Specimen: Stool  Result Value Ref Range Status   Campylobacter species NOT DETECTED NOT DETECTED Final   Plesimonas shigelloides NOT DETECTED NOT DETECTED Final   Salmonella species NOT DETECTED NOT DETECTED Final   Yersinia enterocolitica NOT DETECTED NOT DETECTED Final   Vibrio species NOT DETECTED NOT DETECTED Final   Vibrio cholerae NOT DETECTED NOT DETECTED Final   Enteroaggregative E coli (EAEC) NOT DETECTED NOT DETECTED Final   Enteropathogenic E coli (EPEC) NOT DETECTED NOT DETECTED Final   Enterotoxigenic E coli (ETEC) NOT DETECTED NOT DETECTED Final   Shiga like toxin producing E coli (STEC) NOT DETECTED NOT DETECTED Final   Shigella/Enteroinvasive E coli (EIEC) NOT DETECTED NOT DETECTED Final   Cryptosporidium NOT DETECTED NOT DETECTED Final   Cyclospora cayetanensis NOT DETECTED NOT DETECTED Final   Entamoeba histolytica NOT DETECTED NOT DETECTED Final   Giardia lamblia NOT DETECTED NOT DETECTED Final   Adenovirus F40/41 NOT DETECTED NOT DETECTED Final   Astrovirus NOT DETECTED NOT DETECTED Final   Norovirus GI/GII NOT DETECTED NOT DETECTED Final   Rotavirus A NOT DETECTED NOT DETECTED Final   Sapovirus (I, II, IV, and V) NOT DETECTED NOT DETECTED Final    Comment: Performed at [facility], [address] [place], [place] [address]  Resp Panel by RT-PCR (Flu A&B, Covid) Nasopharyngeal Swab     Status: None   Collection Time: 05/06/20  2:17 PM   Specimen: Nasopharyngeal Swab; Nasopharyngeal(NP) swabs in vial transport medium  Result Value Ref Range Status   SARS Coronavirus 2 by RT PCR NEGATIVE NEGATIVE Final    Comment:  (NOTE) SARS-CoV-2 target nucleic acids are NOT DETECTED.  The SARS-CoV-2 RNA is generally detectable in upper respiratory specimens during the acute phase of infection. The lowest concentration of SARS-CoV-2 viral copies this assay can detect is 138 copies/mL. A negative result does not preclude SARS-Cov-2 infection and should not be used as the sole basis for treatment or other patient management decisions. A negative result may occur with  improper specimen collection/handling, submission of specimen other than nasopharyngeal swab, presence of viral mutation(s) within the areas targeted by this assay, and inadequate number of viral copies(<138 copies/mL). A negative result must be combined with clinical observations, patient history, and epidemiological information. The expected result is Negative.  Fact Sheet for Patients:  EntrepreneurPulse.com.au  Fact Sheet for Healthcare Providers:  IncredibleEmployment.be  This test is no t yet approved or cleared by the [place] FDA and  has been authorized for detection and/or diagnosis of SARS-CoV-2 by FDA under an Emergency Use Authorization (EUA). This EUA will remain  in effect (meaning this test can be used) for the duration of the COVID-19 declaration under Section 564(b)(1) of the Act, 21 U.S.C.section 360bbb-3(b)(1), unless the authorization is terminated  or revoked sooner.       Influenza A by PCR NEGATIVE NEGATIVE Final   Influenza B by PCR NEGATIVE NEGATIVE Final    Comment: (NOTE) The Xpert Xpress SARS-CoV-2/FLU/RSV plus assay is intended as an aid in the diagnosis of influenza from Nasopharyngeal swab specimens and should not be used as a sole basis for treatment. Nasal washings and aspirates are unacceptable for Xpert Xpress SARS-CoV-2/FLU/RSV testing.  Fact Sheet for Patients: EntrepreneurPulse.com.au  Fact Sheet for Healthcare  Providers: IncredibleEmployment.be  This  test is not yet approved or cleared by the [place] and has been authorized for detection and/or diagnosis of SARS-CoV-2 by FDA under an Emergency Use Authorization (EUA). This EUA will remain in effect (meaning this test can be used) for the duration of the COVID-19 declaration under Section 564(b)(1) of the Act, 21 U.S.C. section 360bbb-3(b)(1), unless the authorization is terminated or revoked.  Performed at [facility], [address] [place], [place] [address]      Time coordinating discharge: Over 30 minutes  SIGNED:   Ezekiel Slocumb, DO [facility] 05/07/2020, 3:36 PM   If 7PM-7AM, please contact night-coverage [email or web address]

## 2020-05-07 NOTE — Progress Notes (Signed)
Vonda Antigua, MD [address] [place], [place], [place], [address] [address], [place], [place], [place], [address] Phone: [phone]  Fax: [phone]   Subjective: Patient reports complete resolution of symptoms at this time.  No abdominal pain.  No further episodes of bleeding in over 24 hours.  No nausea or vomiting.  Tolerating liquid diet well.   Objective: Exam: Vital signs in last 24 hours: Vitals:   05/06/20 2325 05/07/20 0305 05/07/20 0723 05/07/20 1110  BP: (!) 106/50 (!) 129/58 (!) 128/104 130/73  Pulse: (!) 59 62 (!) 58 (!) 56  Resp:   19 18  Temp: 98.4 F (36.9 C) 98.3 F (36.8 C) (!) 97.5 F (36.4 C) 97.8 F (36.6 C)  TempSrc: Oral Oral Oral Oral  SpO2: 94% 94% 98% 96%  Weight:      Height:       Weight change:   Intake/Output Summary (Last 24 hours) at 05/07/2020 1201 Last data filed at 05/07/2020 0400 Gross per 24 hour  Intake 1260.57 ml  Output 1 ml  Net 1259.57 ml    General: No acute distress, AAO x3 Abd: Soft, NT/ND, No HSM Skin: Warm, no rashes Neck: Supple, Trachea midline   Lab Results: Lab Results  Component Value Date   WBC 9.4 05/06/2020   HGB 14.6 05/07/2020   HCT 44.5 05/06/2020   MCV 89.5 05/06/2020   PLT 232 05/06/2020   Micro Results: Recent Results (from the past 240 hour(s))  C Difficile Quick Screen w PCR reflex     Status: None   Collection Time: 05/06/20  1:12 PM   Specimen: STOOL  Result Value Ref Range Status   C Diff antigen NEGATIVE NEGATIVE Final   C Diff toxin NEGATIVE NEGATIVE Final   C Diff interpretation No C. difficile detected.  Final    Comment: Performed at [facility], [address] [place], [place] [address]  Gastrointestinal Panel by PCR , Stool     Status: None   Collection Time: 05/06/20  1:12 PM   Specimen: Stool  Result Value Ref Range Status   Campylobacter species NOT DETECTED NOT DETECTED Final   Plesimonas shigelloides NOT DETECTED NOT DETECTED Final    Salmonella species NOT DETECTED NOT DETECTED Final   Yersinia enterocolitica NOT DETECTED NOT DETECTED Final   Vibrio species NOT DETECTED NOT DETECTED Final   Vibrio cholerae NOT DETECTED NOT DETECTED Final   Enteroaggregative E coli (EAEC) NOT DETECTED NOT DETECTED Final   Enteropathogenic E coli (EPEC) NOT DETECTED NOT DETECTED Final   Enterotoxigenic E coli (ETEC) NOT DETECTED NOT DETECTED Final   Shiga like toxin producing E coli (STEC) NOT DETECTED NOT DETECTED Final   Shigella/Enteroinvasive E coli (EIEC) NOT DETECTED NOT DETECTED Final   Cryptosporidium NOT DETECTED NOT DETECTED Final   Cyclospora cayetanensis NOT DETECTED NOT DETECTED Final   Entamoeba histolytica NOT DETECTED NOT DETECTED Final   Giardia lamblia NOT DETECTED NOT DETECTED Final   Adenovirus F40/41 NOT DETECTED NOT DETECTED Final   Astrovirus NOT DETECTED NOT DETECTED Final   Norovirus GI/GII NOT DETECTED NOT DETECTED Final   Rotavirus A NOT DETECTED NOT DETECTED Final   Sapovirus (I, II, IV, and V) NOT DETECTED NOT DETECTED Final    Comment: Performed at [facility], [address] [place], [place] [address]  Resp Panel by RT-PCR (Flu A&B, Covid) Nasopharyngeal Swab     Status: None   Collection Time: 05/06/20  2:17 PM   Specimen: Nasopharyngeal  Swab; Nasopharyngeal(NP) swabs in vial transport medium  Result Value Ref Range Status   SARS Coronavirus 2 by RT PCR NEGATIVE NEGATIVE Final    Comment: (NOTE) SARS-CoV-2 target nucleic acids are NOT DETECTED.  The SARS-CoV-2 RNA is generally detectable in upper respiratory specimens during the acute phase of infection. The lowest concentration of SARS-CoV-2 viral copies this assay can detect is 138 copies/mL. A negative result does not preclude SARS-Cov-2 infection and should not be used as the sole basis for treatment or other patient management decisions. A negative result may occur with  improper specimen collection/handling, submission of specimen  other than nasopharyngeal swab, presence of viral mutation(s) within the areas targeted by this assay, and inadequate number of viral copies(<138 copies/mL). A negative result must be combined with clinical observations, patient history, and epidemiological information. The expected result is Negative.  Fact Sheet for Patients:  EntrepreneurPulse.com.au  Fact Sheet for Healthcare Providers:  IncredibleEmployment.be  This test is no t yet approved or cleared by the [place] FDA and  has been authorized for detection and/or diagnosis of SARS-CoV-2 by FDA under an Emergency Use Authorization (EUA). This EUA will remain  in effect (meaning this test can be used) for the duration of the COVID-19 declaration under Section 564(b)(1) of the Act, 21 U.S.C.section 360bbb-3(b)(1), unless the authorization is terminated  or revoked sooner.       Influenza A by PCR NEGATIVE NEGATIVE Final   Influenza B by PCR NEGATIVE NEGATIVE Final    Comment: (NOTE) The Xpert Xpress SARS-CoV-2/FLU/RSV plus assay is intended as an aid in the diagnosis of influenza from Nasopharyngeal swab specimens and should not be used as a sole basis for treatment. Nasal washings and aspirates are unacceptable for Xpert Xpress SARS-CoV-2/FLU/RSV testing.  Fact Sheet for Patients: EntrepreneurPulse.com.au  Fact Sheet for Healthcare Providers: IncredibleEmployment.be  This test is not yet approved or cleared by the [place] FDA and has been authorized for detection and/or diagnosis of SARS-CoV-2 by FDA under an Emergency Use Authorization (EUA). This EUA will remain in effect (meaning this test can be used) for the duration of the COVID-19 declaration under Section 564(b)(1) of the Act, 21 U.S.C. section 360bbb-3(b)(1), unless the authorization is terminated or revoked.  Performed at [facility], [address],  [place], [place] [address]    Studies/Results: CT Angio Abd/Pel W and/or Wo Contrast  Result Date: 05/06/2020 CLINICAL DATA:  Acute rectal bleeding and abdominal pain. EXAM: CT ANGIOGRAPHY ABDOMEN AND PELVIS WITH CONTRAST AND WITHOUT CONTRAST TECHNIQUE: Multidetector CT imaging of the abdomen and pelvis was performed using the standard protocol during bolus administration of intravenous contrast. Multiplanar reconstructed images and MIPs were obtained and reviewed to evaluate the vascular anatomy. CONTRAST:  17mL OMNIPAQUE IOHEXOL 350 MG/ML SOLN COMPARISON:  Report from a prior CT of the abdomen on 12/20/2001. FINDINGS: VASCULAR Aorta: Normally patent with mild calcified plaque in the infrarenal aorta. No evidence of aortic aneurysm or dissection. Celiac: Normally patent. Distal branch vessels demonstrate normal patency. The left gastric artery has a separate origin off of the abdominal aorta just above the celiac trunk. SMA: Normally patent. Renals: Single right renal artery and 2 separate left renal arteries demonstrate normal patency. IMA: Normally patent. Inflow: Bilateral iliac arteries demonstrate normal patency without aneurysm or obstructive disease. Proximal Outflow: Normally patent bilateral common femoral arteries and femoral bifurcations. Veins: Venous phase demonstrates normal patency of venous structures in the abdomen and pelvis including mesenteric veins, portal vein, splenic vein, IVC, bilateral  renal veins, iliac veins and common femoral veins. Review of the MIP images confirms the above findings. NON-VASCULAR Lower chest: No acute abnormality. Hepatobiliary: Hepatic steatosis without overt cirrhotic morphology of the liver. No focal masses. No biliary dilatation. Likely a few small gallstones in the gallbladder. Pancreas: Unremarkable. No pancreatic ductal dilatation or surrounding inflammatory changes. Spleen: Normal in size without focal abnormality. Adrenals/Urinary Tract: Adrenal glands are  unremarkable. Nonobstructing small calculi in the upper pole collecting systems of both kidneys. No hydronephrosis or focal renal masses. Bladder is unremarkable. Stomach/Bowel: Inflammatory thickening is noted involving the descending colon over a diffuse segment beginning at roughly the mid descending colon and continuing into the proximal sigmoid. Findings most likely relate to colitis given long segment of colonic inflammation. There are some sparse diverticula in this segment as well. No evidence of focal abscess. An obvious focal mass is not seen by CT although oral contrast was not administered. No evidence of bowel obstruction or free intraperitoneal air. No evidence of active bleeding into the gastrointestinal tract by arterial and venous phase CTA. Lymphatic: No enlarged abdominal or pelvic lymph nodes. Reproductive: Prostate is unremarkable. Other: Tiny right inguinal hernia contains fat. No abdominopelvic ascites. Musculoskeletal: No abdominal wall hernia or abnormality. No abdominopelvic ascites. IMPRESSION: 1. No evidence of significant mesenteric arterial occlusive disease. 2. Inflammatory thickening involving a diffuse segment of the descending colon and continuing into the proximal sigmoid colon. Findings are felt to be most likely related to colitis. An obvious focal mass is not seen by CT although oral contrast was not administered. 3. Hepatic steatosis without overt cirrhotic morphology of the liver. 4. Cholelithiasis. 5. Nonobstructing small calculi in the upper pole collecting systems of both kidneys. Electronically Signed   By: Aletta Edouard M.D.   On: 05/06/2020 13:07   Medications:  Scheduled Meds: . amLODipine  5 mg Oral Daily  .  HYDROmorphone (DILAUDID) injection  0.5 mg Intravenous Once  . lisinopril  20 mg Oral Daily  . metroNIDAZOLE  500 mg Oral Q8H  . multivitamin with minerals  1 tablet Oral Daily  . oxyCODONE-acetaminophen  1 tablet Oral Once  . rosuvastatin  10 mg Oral  Daily   Continuous Infusions: . cefTRIAXone (ROCEPHIN)  IV 1 g (05/07/20 1016)   PRN Meds:.acetaminophen **OR** acetaminophen, morphine injection, ondansetron **OR** ondansetron (ZOFRAN) IV, oxyCODONE   Assessment: Active Problems:   Primary hypertension   Colitis    Plan: Stool studies were negative for infection Would recommend primary team to reassess patient's Celebrex, as NSAIDs can have adverse effects of GI ulcers, and inflammation  Would recommend colonoscopy as an outpatient given that acute symptoms are resolving, to assess site of inflammation noted on CT.  It would be best done after the acute inflammation noted at that site improves, to prevent complications such as perforation during the procedure.  Patient is agreeable with the plan.  However, if symptoms recur, we may consider procedure as an inpatient, or sooner as needed  Patient to follow-up in GI clinic within 2 to 4 weeks of discharge and he is agreeable with the plan   LOS: 1 day   Vonda Antigua, MD 05/07/2020, 12:01 PM

## 2020-05-07 NOTE — Progress Notes (Signed)
Patient developed immediate hypersensitivity reaction to Ciprofloxacin IV. Patient reported itching, flushing, chest heaviness and sensation of throat closing up. The medication was discontinued immediately and IV benadryl administered with resolution of symptoms.   -Discontinue offending IV abx -Consider alternative, Ceftriaxone or Bactrim po if appropriate -PRN Benadryl -GI consult pending    Rufina Falco, BSN, MSN, DNP, CCRN,FNP-BC  [facility] Nurse Practitioner  [facility]

## 2020-05-07 NOTE — Progress Notes (Signed)
PHARMACIST - PHYSICIAN COMMUNICATION DR:   Arbutus Ped CONCERNING: Antibiotic IV to Oral Route Change Policy  RECOMMENDATION: This patient is receiving metronidazole by the intravenous route (which is critical low supply).    Based on criteria approved by the Pharmacy and Therapeutics Committee, the antibiotic(s) is/are being converted to the equivalent oral dose form(s).   DESCRIPTION: These criteria include:  Patient being treated for a respiratory tract infection, urinary tract infection, cellulitis or clostridium difficile associated diarrhea if on metronidazole  The patient is not neutropenic and does not exhibit a GI malabsorption state  The patient is eating (either orally or via tube) and/or has been taking other orally administered medications for a least 24 hours  The patient is improving clinically and has a Tmax < 100.5  If you have questions about this conversion, please contact the [place], PharmD, BCPS Clinical Pharmacist 05/07/2020 10:19 AM

## 2020-05-09 ENCOUNTER — Telehealth: Payer: Self-pay | Admitting: Pharmacist

## 2020-05-09 NOTE — Progress Notes (Addendum)
    Chronic Care Management Pharmacy Assistant   Name: Sean Mullins  MRN: [id number] DOB: 12-27-1950  Reason for Encounter: Medication Adherence Call    PCP : [name], MD  Allergies:   Allergies  Allergen Reactions   Ciprofloxacin Hcl Anaphylaxis   Bystolic [Nebivolol Hcl] Other (See Comments)    Severe fatigue    Medications: Outpatient Encounter Medications as of 05/09/2020  Medication Sig   amLODipine (NORVASC) 5 MG tablet Take 1 tablet (5 mg total) by mouth daily.   aspirin EC 81 MG tablet Take 1 tablet (81 mg total) by mouth daily.   butalbital-acetaminophen-caffeine (FIORICET) 50-325-40 MG tablet Take 1 tablet by mouth every 6 (six) hours as needed for headache.   celecoxib (CELEBREX) 200 MG capsule Take 1 capsule (200 mg total) by mouth 2 (two) times daily.   lisinopril (ZESTRIL) 20 MG tablet TAKE 1 TABLET BY MOUTH ONCE DAILY   Multiple Vitamin (MULTIVITAMIN WITH MINERALS) TABS tablet Take 1 tablet by mouth daily.   rosuvastatin (CRESTOR) 10 MG tablet TAKE 1 TABLET BY MOUTH ONCE DAILY   No facility-administered encounter medications on file as of 05/09/2020.    Current Diagnosis: Patient Active Problem List   Diagnosis Date Noted   Colitis 05/06/2020   Lower GI bleed    Severe sleep apnea 09/13/2019   Complex sleep apnea syndrome 08/16/2019   Chronic intermittent hypoxia with obstructive sleep apnea 08/16/2019   Migraine 05/15/2019   Diverticulitis 02/28/2019   BPPV (benign paroxysmal positional vertigo) 01/25/2016   Primary hypertension 04/29/2015   History of TIA (transient ischemic attack) 04/29/2015   Hyperlipidemia 04/29/2015   Diabetes ([place]) 04/29/2015   Obese 05/17/2008   OBSTRUCTIVE SLEEP APNEA, severe 05/17/2008   RESTLESS LEG SYNDROME 05/17/2008   Coronary atherosclerosis 05/17/2008   Osteoarthritis 05/17/2008    Goals Addressed   None     Follow-Up:  Pharmacist Review    The patient was on the medication adherence list for  Rosuvastatin. After speaking with the patient he stated that he has been taking his medication daily and he will be due for his next refill on or around 05/17/2020 at [facility].  He denied any barriers for not taking his medications daily the patient stated that he has not had any issues with his medications.   Rosendo Gros, [facility]  Practice Team Manager/ CPA (Clinical Pharmacist Assistant) [phone]

## 2020-05-11 NOTE — Progress Notes (Signed)
Subjective:    Patient ID: Sean Mullins, male    DOB: 10/14/1950, 69 y.o.   MRN: [id number]  HPI The patient is here for follow up from the hospital.    Admitted 05/06/20 - 05/07/20 - inflammatory colitis.    He had lower abdominal pain and felt constipated int he morning.  that evening he had episodes of diarrhea and then started passing bloody stool.  He had no fever, nausea/vomiting.  In ED VSS.  CT of abdomen showed inflammatory thickening involving diffuse segment of desc colon to proximal colon - likely colitis.  No mass, no mesenteric arterial occlusive disease.   WBC 10.9,  H/H stable   He was started on IV cipro and flagyl.  GI studies negative.  Per patient it was thought to be related to his celebrex.  He has not taken it since.     Last colonoscopy 2010   The first day he was home he had dark black stool.  His stool is softer, but denies diarrhea, abdominal pain or fever.      Medications and allergies reviewed with patient and updated if appropriate.  Patient Active Problem List   Diagnosis Date Noted  . Colitis 05/06/2020  . Lower GI bleed   . Severe sleep apnea 09/13/2019  . Complex sleep apnea syndrome 08/16/2019  . Chronic intermittent hypoxia with obstructive sleep apnea 08/16/2019  . Migraine 05/15/2019  . Diverticulitis 02/28/2019  . BPPV (benign paroxysmal positional vertigo) 01/25/2016  . Primary hypertension 04/29/2015  . History of TIA (transient ischemic attack) 04/29/2015  . Hyperlipidemia 04/29/2015  . Diabetes ([place]) 04/29/2015  . Obese 05/17/2008  . OBSTRUCTIVE SLEEP APNEA, severe 05/17/2008  . RESTLESS LEG SYNDROME 05/17/2008  . Coronary atherosclerosis 05/17/2008  . Osteoarthritis 05/17/2008    Current Outpatient Medications on File Prior to Visit  Medication Sig Dispense Refill  . amLODipine (NORVASC) 5 MG tablet Take 1 tablet (5 mg total) by mouth daily. 90 tablet 3  . aspirin EC 81 MG tablet Take 1 tablet (81 mg total) by mouth  daily. 30 tablet 0  . butalbital-acetaminophen-caffeine (FIORICET) 50-325-40 MG tablet Take 1 tablet by mouth every 6 (six) hours as needed for headache. 10 tablet 3  . lisinopril (ZESTRIL) 20 MG tablet TAKE 1 TABLET BY MOUTH ONCE DAILY 90 tablet 1  . Multiple Vitamin (MULTIVITAMIN WITH MINERALS) TABS tablet Take 1 tablet by mouth daily.    . rosuvastatin (CRESTOR) 10 MG tablet TAKE 1 TABLET BY MOUTH ONCE DAILY 90 tablet 1  . celecoxib (CELEBREX) 200 MG capsule Take 1 capsule (200 mg total) by mouth 2 (two) times daily. (Patient not taking: Reported on 05/12/2020) 180 capsule 0   No current facility-administered medications on file prior to visit.    Past Medical History:  Diagnosis Date  . Anxiety   . Arthritis   . Diverticula of colon   . TIA (transient ischemic attack)    November 2013    Past Surgical History:  Procedure Laterality Date  . DUODENAL DIVERTICULECTOMY     2003  . KNEE ARTHROPLASTY Left     Social History   Socioeconomic History  . Marital status: Married    Spouse name: Not on file  . Number of children: Not on file  . Years of education: Not on file  . Highest education level: Not on file  Occupational History  . Not on file  Tobacco Use  . Smoking status: Former Smoker    Packs/day: 1.50  Years: 30.00    Pack years: 71.00    Quit date: 06/07/1996    Years since quitting: 23.9  . Smokeless tobacco: Never Used  Substance and Sexual Activity  . Alcohol use: Yes    Comment: ocassional   . Drug use: Not on file  . Sexual activity: Not on file  Other Topics Concern  . Not on file  Social History Narrative   No regular exercise.    Social Determinants of Health   Financial Resource Strain: Low Risk   . Difficulty of Paying Living Expenses: Not hard at all  Food Insecurity: No Food Insecurity  . Worried About Charity fundraiser in the Last Year: Never true  . Ran Out of Food in the Last Year: Never true  Transportation Needs: No Transportation  Needs  . Lack of Transportation (Medical): No  . Lack of Transportation (Non-Medical): No  Physical Activity: Sufficiently Active  . Days of Exercise per Week: 5 days  . Minutes of Exercise per Session: 30 min  Stress: No Stress Concern Present  . Feeling of Stress : Not at all  Social Connections: Socially Integrated  . Frequency of Communication with Friends and Family: More than three times a week  . Frequency of Social Gatherings with Friends and Family: More than three times a week  . Attends Religious Services: More than 4 times per year  . Active Member of Clubs or Organizations: Yes  . Attends Archivist Meetings: More than 4 times per year  . Marital Status: Married    Family History  Problem Relation Age of Onset  . Transient ischemic attack Father     Review of Systems  Constitutional: Negative for appetite change and fever.  Respiratory: Negative for shortness of breath.   Cardiovascular: Negative for chest pain and palpitations.  Gastrointestinal: Negative for abdominal pain, blood in stool and nausea.  Neurological: Negative for light-headedness and headaches.       Objective:   Vitals:   05/12/20 1402  BP: 126/70  Pulse: 82  Temp: 98.1 F (36.7 C)  SpO2: 95%   BP Readings from Last 3 Encounters:  05/12/20 126/70  05/07/20 (!) 113/51  05/06/20 (!) 165/89   Wt Readings from Last 3 Encounters:  05/12/20 280 lb 6.4 oz (127.2 kg)  05/06/20 280 lb (127 kg)  05/06/20 280 lb (127 kg)   Body mass index is 35.05 kg/m.   Physical Exam    Constitutional: Appears well-developed and well-nourished. No distress.  HENT:  Head: Normocephalic and atraumatic.  Neck: Neck supple. No tracheal deviation present. No thyromegaly present.  No cervical lymphadenopathy Cardiovascular: Normal rate, regular rhythm and normal heart sounds.   No murmur heard. No carotid bruit .  No edema Pulmonary/Chest: Effort normal and breath sounds normal. No respiratory  distress. No has no wheezes. No rales.  Abdomen: soft, NT, ND Skin: Skin is warm and dry. Not diaphoretic.  Psychiatric: Normal mood and affect. Behavior is normal.   CT Angio Abd/Pel W and/or Wo Contrast CLINICAL DATA:  Acute rectal bleeding and abdominal pain.  EXAM: CT ANGIOGRAPHY ABDOMEN AND PELVIS WITH CONTRAST AND WITHOUT CONTRAST  TECHNIQUE: Multidetector CT imaging of the abdomen and pelvis was performed using the standard protocol during bolus administration of intravenous contrast. Multiplanar reconstructed images and MIPs were obtained and reviewed to evaluate the vascular anatomy.  CONTRAST:  151mL OMNIPAQUE IOHEXOL 350 MG/ML SOLN  COMPARISON:  Report from a prior CT of the abdomen on 12/20/2001.  FINDINGS: VASCULAR  Aorta: Normally patent with mild calcified plaque in the infrarenal aorta. No evidence of aortic aneurysm or dissection.  Celiac: Normally patent. Distal branch vessels demonstrate normal patency. The left gastric artery has a separate origin off of the abdominal aorta just above the celiac trunk.  SMA: Normally patent.  Renals: Single right renal artery and 2 separate left renal arteries demonstrate normal patency.  IMA: Normally patent.  Inflow: Bilateral iliac arteries demonstrate normal patency without aneurysm or obstructive disease.  Proximal Outflow: Normally patent bilateral common femoral arteries and femoral bifurcations.  Veins: Venous phase demonstrates normal patency of venous structures in the abdomen and pelvis including mesenteric veins, portal vein, splenic vein, IVC, bilateral renal veins, iliac veins and common femoral veins.  Review of the MIP images confirms the above findings.  NON-VASCULAR  Lower chest: No acute abnormality.  Hepatobiliary: Hepatic steatosis without overt cirrhotic morphology of the liver. No focal masses. No biliary dilatation. Likely a few small gallstones in the gallbladder.  Pancreas:  Unremarkable. No pancreatic ductal dilatation or surrounding inflammatory changes.  Spleen: Normal in size without focal abnormality.  Adrenals/Urinary Tract: Adrenal glands are unremarkable. Nonobstructing small calculi in the upper pole collecting systems of both kidneys. No hydronephrosis or focal renal masses. Bladder is unremarkable.  Stomach/Bowel: Inflammatory thickening is noted involving the descending colon over a diffuse segment beginning at roughly the mid descending colon and continuing into the proximal sigmoid. Findings most likely relate to colitis given long segment of colonic inflammation. There are some sparse diverticula in this segment as well. No evidence of focal abscess. An obvious focal mass is not seen by CT although oral contrast was not administered. No evidence of bowel obstruction or free intraperitoneal air. No evidence of active bleeding into the gastrointestinal tract by arterial and venous phase CTA.  Lymphatic: No enlarged abdominal or pelvic lymph nodes.  Reproductive: Prostate is unremarkable.  Other: Tiny right inguinal hernia contains fat. No abdominopelvic ascites.  Musculoskeletal: No abdominal wall hernia or abnormality. No abdominopelvic ascites.  IMPRESSION: 1. No evidence of significant mesenteric arterial occlusive disease. 2. Inflammatory thickening involving a diffuse segment of the descending colon and continuing into the proximal sigmoid colon. Findings are felt to be most likely related to colitis. An obvious focal mass is not seen by CT although oral contrast was not administered. 3. Hepatic steatosis without overt cirrhotic morphology of the liver. 4. Cholelithiasis. 5. Nonobstructing small calculi in the upper pole collecting systems of both kidneys.  Electronically Signed   By: Aletta Edouard M.D.   On: 05/06/2020 13:07   Lab Results  Component Value Date   WBC 9.4 05/06/2020   HGB 14.6 05/07/2020   HCT 44.5  05/06/2020   PLT 232 05/06/2020   GLUCOSE 143 (H) 05/06/2020   CHOL 119 02/28/2020   TRIG 138 02/28/2020   HDL 39 (L) 02/28/2020   LDLCALC 58 02/28/2020   ALT 32 05/06/2020   AST 25 05/06/2020   NA 134 (L) 05/06/2020   K 4.2 05/06/2020   CL 100 05/06/2020   CREATININE 0.71 05/06/2020   BUN 12 05/06/2020   CO2 22 05/06/2020   TSH 1.54 02/28/2020   PSA 0.88 02/28/2020   INR 1.0 05/06/2020   HGBA1C 6.0 (H) 02/28/2020   MICROALBUR 0.8 04/08/2016      Assessment & Plan:    See Problem List for Assessment and Plan of chronic medical problems.    This visit occurred during the SARS-CoV-2 public health emergency.  Safety protocols were in place, including screening questions prior to the visit, additional usage of staff PPE, and extensive cleaning of exam room while observing appropriate contact time as indicated for disinfecting solutions.

## 2020-05-11 NOTE — Patient Instructions (Addendum)
  Blood work was ordered.     Medications changes include :  none      A referral was ordered for GI .    Someone from their office will call you to schedule an appointment.

## 2020-05-12 ENCOUNTER — Encounter: Payer: Self-pay | Admitting: Internal Medicine

## 2020-05-12 ENCOUNTER — Other Ambulatory Visit: Payer: Self-pay

## 2020-05-12 ENCOUNTER — Ambulatory Visit (INDEPENDENT_AMBULATORY_CARE_PROVIDER_SITE_OTHER): Payer: PPO | Admitting: Internal Medicine

## 2020-05-12 VITALS — BP 126/70 | HR 82 | Temp 98.1°F | Ht 75.0 in | Wt 280.4 lb

## 2020-05-12 DIAGNOSIS — K529 Noninfective gastroenteritis and colitis, unspecified: Secondary | ICD-10-CM

## 2020-05-12 DIAGNOSIS — G4733 Obstructive sleep apnea (adult) (pediatric): Secondary | ICD-10-CM | POA: Diagnosis not present

## 2020-05-12 DIAGNOSIS — I10 Essential (primary) hypertension: Secondary | ICD-10-CM | POA: Diagnosis not present

## 2020-05-12 DIAGNOSIS — M8949 Other hypertrophic osteoarthropathy, multiple sites: Secondary | ICD-10-CM

## 2020-05-12 DIAGNOSIS — E119 Type 2 diabetes mellitus without complications: Secondary | ICD-10-CM | POA: Diagnosis not present

## 2020-05-12 DIAGNOSIS — M159 Polyosteoarthritis, unspecified: Secondary | ICD-10-CM

## 2020-05-12 LAB — CBC WITH DIFFERENTIAL/PLATELET
Basophils Absolute: 0.1 10*3/uL (ref 0.0–0.1)
Basophils Relative: 0.9 % (ref 0.0–3.0)
Eosinophils Absolute: 0.2 10*3/uL (ref 0.0–0.7)
Eosinophils Relative: 2.8 % (ref 0.0–5.0)
HCT: 43.4 % (ref 39.0–52.0)
Hemoglobin: 15.1 g/dL (ref 13.0–17.0)
Lymphocytes Relative: 44.8 % (ref 12.0–46.0)
Lymphs Abs: 3.1 10*3/uL (ref 0.7–4.0)
MCHC: 34.7 g/dL (ref 30.0–36.0)
MCV: 92.3 fl (ref 78.0–100.0)
Monocytes Absolute: 0.6 10*3/uL (ref 0.1–1.0)
Monocytes Relative: 9.5 % (ref 3.0–12.0)
Neutro Abs: 2.9 10*3/uL (ref 1.4–7.7)
Neutrophils Relative %: 42 % — ABNORMAL LOW (ref 43.0–77.0)
Platelets: 229 10*3/uL (ref 150.0–400.0)
RBC: 4.7 Mil/uL (ref 4.22–5.81)
RDW: 13.1 % (ref 11.5–15.5)
WBC: 6.8 10*3/uL (ref 4.0–10.5)

## 2020-05-12 LAB — BASIC METABOLIC PANEL
BUN: 13 mg/dL (ref 6–23)
CO2: 29 mEq/L (ref 19–32)
Calcium: 9.7 mg/dL (ref 8.4–10.5)
Chloride: 102 mEq/L (ref 96–112)
Creatinine, Ser: 0.8 mg/dL (ref 0.40–1.50)
GFR: 91.76 mL/min (ref >60.00)
Glucose, Bld: 103 mg/dL — ABNORMAL HIGH (ref 70–99)
Potassium: 4.4 mEq/L (ref 3.5–5.1)
Sodium: 138 mEq/L (ref 135–145)

## 2020-05-12 MED ORDER — EPINEPHRINE 0.3 MG/0.3ML IJ SOAJ: 0.3000 mg | INTRAMUSCULAR | 2 refills | Status: DC | PRN

## 2020-05-12 NOTE — Assessment & Plan Note (Signed)
Chronic BP well controlled Continue amlodipine 5 mg daily, lisinopril 20 mg daily cmp  

## 2020-05-12 NOTE — Assessment & Plan Note (Signed)
Acute Hospitalization reviewed No bleeding or abdominal pain since d/c Needs to f/u with GI - will order referral Cbc, bmp today

## 2020-05-12 NOTE — Assessment & Plan Note (Signed)
Chronic Not taking celebrex given recent hospitalization Advised taking tylenol three times a day

## 2020-05-12 NOTE — Assessment & Plan Note (Signed)
Chronic Diet controlled - well controlled  Lab Results  Component Value Date   HGBA1C 6.0 (H) 02/28/2020

## 2020-05-12 NOTE — Assessment & Plan Note (Signed)
Chronic Has not been using his cpap recently - stressed importance of regular use

## 2020-05-14 ENCOUNTER — Encounter: Payer: Self-pay | Admitting: Gastroenterology

## 2020-05-14 ENCOUNTER — Ambulatory Visit (INDEPENDENT_AMBULATORY_CARE_PROVIDER_SITE_OTHER): Payer: PPO | Admitting: Gastroenterology

## 2020-05-14 ENCOUNTER — Other Ambulatory Visit: Payer: Self-pay

## 2020-05-14 VITALS — BP 112/65 | HR 64 | Temp 97.8°F | Ht 75.0 in | Wt 281.0 lb

## 2020-05-14 DIAGNOSIS — K529 Noninfective gastroenteritis and colitis, unspecified: Secondary | ICD-10-CM

## 2020-05-14 DIAGNOSIS — Z1211 Encounter for screening for malignant neoplasm of colon: Secondary | ICD-10-CM

## 2020-05-14 MED ORDER — NA SULFATE-K SULFATE-MG SULF 17.5-3.13-1.6 GM/177ML PO SOLN: ORAL | 0 refills | Status: DC

## 2020-05-15 NOTE — Progress Notes (Signed)
Vonda Antigua, MD [address]  [place]  [place], [place] [address]  Main: [phone]  Fax: [phone]   Primary Care Physician: [name], MD   Chief complaint: Colitis  HPI: Sean Mullins is a 69 y.o. male here for posthospitalization follow-up.  Patient presents with his wife.  He denies any abdominal symptoms since hospital discharge. The patient denies abdominal or flank pain, anorexia, nausea or vomiting, dysphagia, change in bowel habits or black or bloody stools or weight loss.  Patient CT scan during hospitalization showed thickening in the descending colon continuing into the proximal sigmoid colon with no evidence of mesenteric arterial occlusive disease and patient symptoms resolved quickly with antibiotics with GI panel being negative for infectious etiology.  As per my consult note: "Is on Celebrex at home.  I personally reviewed his past medical records, and under media there are scant reports of colonoscopies in the past.  2002 colonoscopy done for abdominal pain and bloating shows diverticulosis in the sigmoid and descending colon.  2008 colonoscopy with 2 subcentimeter polyps removed, diverticulosis reported in the descending and sigmoid colon.  Pathology report not available.  2010 colonoscopy states that patient has had previous sigmoid resection due to history of diverticulitis, and colonoscopy findings included diverticulosis in the sigmoid and descending colon, 2 mm polyp removed from the rectum, internal hemorrhoids and was otherwise normal.  Pathology showed hyperplastic polyp.  I was able to see pathology from sigmoid resection from 2003 under his labs that showed acute diverticulitis on the sigmoid colon segmental resection."   Current Outpatient Medications  Medication Sig Dispense Refill  . amLODipine (NORVASC) 5 MG tablet Take 1 tablet (5 mg total) by mouth daily. 90 tablet 3  . aspirin EC 81 MG tablet Take 1 tablet (81 mg total) by mouth  daily. 30 tablet 0  . butalbital-acetaminophen-caffeine (FIORICET) 50-325-40 MG tablet Take 1 tablet by mouth every 6 (six) hours as needed for headache. 10 tablet 3  . EPINEPHrine 0.3 mg/0.3 mL IJ SOAJ injection Inject 0.3 mg into the muscle as needed for anaphylaxis. 1 each 2  . lisinopril (ZESTRIL) 20 MG tablet TAKE 1 TABLET BY MOUTH ONCE DAILY 90 tablet 1  . Multiple Vitamin (MULTIVITAMIN WITH MINERALS) TABS tablet Take 1 tablet by mouth daily.    . rosuvastatin (CRESTOR) 10 MG tablet TAKE 1 TABLET BY MOUTH ONCE DAILY 90 tablet 1  . Na Sulfate-K Sulfate-Mg Sulf 17.5-3.13-1.6 GM/177ML SOLN At 5 PM the day before procedure take 1 bottle and 5 hours before procedure take 1 bottle. 354 mL 0   No current facility-administered medications for this visit.    Allergies as of 05/14/2020 - Review Complete 05/14/2020  Allergen Reaction Noted  . Ciprofloxacin hcl Anaphylaxis 05/07/2020  . Bystolic [nebivolol hcl] Other (See Comments) 08/28/2019    ROS:  General: Negative for anorexia, weight loss, fever, chills, fatigue, weakness. ENT: Negative for hoarseness, difficulty swallowing , nasal congestion. CV: Negative for chest pain, angina, palpitations, dyspnea on exertion, peripheral edema.  Respiratory: Negative for dyspnea at rest, dyspnea on exertion, cough, sputum, wheezing.  GI: See history of present illness. GU:  Negative for dysuria, hematuria, urinary incontinence, urinary frequency, nocturnal urination.  Endo: Negative for unusual weight change.    Physical Examination:   BP 112/65   Pulse 64   Temp 97.8 F (36.6 C) (Oral)   Ht 6\' 3"  (1.905 m)   Wt 281 lb (127.5 kg)   BMI 35.12 kg/m  General: Well-nourished, well-developed in no acute distress.  Eyes: No icterus. Conjunctivae pink. Mouth: Oropharyngeal mucosa moist and pink , no lesions erythema or exudate. Neck: Supple, Trachea midline Abdomen: Bowel sounds are normal, nontender, nondistended, no hepatosplenomegaly or  masses, no abdominal bruits or hernia , no rebound or guarding.   Extremities: No lower extremity edema. No clubbing or deformities. Neuro: Alert and oriented x 3.  Grossly intact. Skin: Warm and dry, no jaundice.   Psych: Alert and cooperative, normal mood and affect.   Labs: CMP     Component Value Date/Time   NA 138 05/12/2020 1456   K 4.4 05/12/2020 1456   CL 102 05/12/2020 1456   CO2 29 05/12/2020 1456   GLUCOSE 103 (H) 05/12/2020 1456   BUN 13 05/12/2020 1456   CREATININE 0.80 05/12/2020 1456   CREATININE 0.91 02/28/2020 0925   CALCIUM 9.7 05/12/2020 1456   PROT 7.5 05/06/2020 0925   ALBUMIN 4.2 05/06/2020 0925   AST 25 05/06/2020 0925   ALT 32 05/06/2020 0925   ALKPHOS 63 05/06/2020 0925   BILITOT 1.5 (H) 05/06/2020 0925   GFRNONAA >60 05/06/2020 0925   GFRNONAA 87 02/28/2020 0925   GFRAA 101 02/28/2020 0925   Lab Results  Component Value Date   WBC 6.8 05/12/2020   HGB 15.1 05/12/2020   HCT 43.4 05/12/2020   MCV 92.3 05/12/2020   PLT 229.0 05/12/2020    Imaging Studies: CT Angio Abd/Pel W and/or Wo Contrast  Result Date: 05/06/2020 CLINICAL DATA:  Acute rectal bleeding and abdominal pain. EXAM: CT ANGIOGRAPHY ABDOMEN AND PELVIS WITH CONTRAST AND WITHOUT CONTRAST TECHNIQUE: Multidetector CT imaging of the abdomen and pelvis was performed using the standard protocol during bolus administration of intravenous contrast. Multiplanar reconstructed images and MIPs were obtained and reviewed to evaluate the vascular anatomy. CONTRAST:  191mL OMNIPAQUE IOHEXOL 350 MG/ML SOLN COMPARISON:  Report from a prior CT of the abdomen on 12/20/2001. FINDINGS: VASCULAR Aorta: Normally patent with mild calcified plaque in the infrarenal aorta. No evidence of aortic aneurysm or dissection. Celiac: Normally patent. Distal branch vessels demonstrate normal patency. The left gastric artery has a separate origin off of the abdominal aorta just above the celiac trunk. SMA: Normally patent.  Renals: Single right renal artery and 2 separate left renal arteries demonstrate normal patency. IMA: Normally patent. Inflow: Bilateral iliac arteries demonstrate normal patency without aneurysm or obstructive disease. Proximal Outflow: Normally patent bilateral common femoral arteries and femoral bifurcations. Veins: Venous phase demonstrates normal patency of venous structures in the abdomen and pelvis including mesenteric veins, portal vein, splenic vein, IVC, bilateral renal veins, iliac veins and common femoral veins. Review of the MIP images confirms the above findings. NON-VASCULAR Lower chest: No acute abnormality. Hepatobiliary: Hepatic steatosis without overt cirrhotic morphology of the liver. No focal masses. No biliary dilatation. Likely a few small gallstones in the gallbladder. Pancreas: Unremarkable. No pancreatic ductal dilatation or surrounding inflammatory changes. Spleen: Normal in size without focal abnormality. Adrenals/Urinary Tract: Adrenal glands are unremarkable. Nonobstructing small calculi in the upper pole collecting systems of both kidneys. No hydronephrosis or focal renal masses. Bladder is unremarkable. Stomach/Bowel: Inflammatory thickening is noted involving the descending colon over a diffuse segment beginning at roughly the mid descending colon and continuing into the proximal sigmoid. Findings most likely relate to colitis given long segment of colonic inflammation. There are some sparse diverticula in this segment as well. No evidence of focal abscess. An obvious focal mass is not seen by CT although  oral contrast was not administered. No evidence of bowel obstruction or free intraperitoneal air. No evidence of active bleeding into the gastrointestinal tract by arterial and venous phase CTA. Lymphatic: No enlarged abdominal or pelvic lymph nodes. Reproductive: Prostate is unremarkable. Other: Tiny right inguinal hernia contains fat. No abdominopelvic ascites. Musculoskeletal: No  abdominal wall hernia or abnormality. No abdominopelvic ascites. IMPRESSION: 1. No evidence of significant mesenteric arterial occlusive disease. 2. Inflammatory thickening involving a diffuse segment of the descending colon and continuing into the proximal sigmoid colon. Findings are felt to be most likely related to colitis. An obvious focal mass is not seen by CT although oral contrast was not administered. 3. Hepatic steatosis without overt cirrhotic morphology of the liver. 4. Cholelithiasis. 5. Nonobstructing small calculi in the upper pole collecting systems of both kidneys. Electronically Signed   By: Aletta Edouard M.D.   On: 05/06/2020 13:07    Assessment and Plan:   Sean Mullins is a 69 y.o. y/o male here for posthospitalization follow-up where he presented with abdominal pain and rectal bleeding with CT showing inflammatory thickening involving the descending and proximal sigmoid colon with patient being asymptomatic since hospital discharge   Patient Celebrex was discontinued after last hospital admission as GI panel was negative for infectious etiology and patient had been admitted with thickening of the colon.  NSAIDs at his age can have high risk of causing GI inflammation and ulceration.  The area of thickening was noted to be in the descending to proximal sigmoid colon on his CT scan.  However, patient has had previous sigmoid resection, therefore the location reported on CT may not in fact be the sigmoid colon itself, depending on how much of his sigmoid colon was previously removed  Patient is currently asymptomatic  Would recommend colonoscopy both for screening and for evaluation of the area of colitis seen on CT scan.  No indication for urgent endoscopy at this time, and allowing at least 6 weeks from his last CT scan for the colonoscopy would allow for the area of thickening to heal and lower risk of perforation and complications during the procedure  I have also reached  out to his PCP and requested them to contact the patient, as patient has been taking Tylenol for his arthritis pain and it is not working well.   Dr Vonda Antigua

## 2020-05-21 ENCOUNTER — Other Ambulatory Visit: Payer: Self-pay | Admitting: Internal Medicine

## 2020-06-26 ENCOUNTER — Other Ambulatory Visit: Payer: Self-pay | Admitting: Internal Medicine

## 2020-07-02 ENCOUNTER — Other Ambulatory Visit
Admission: RE | Admit: 2020-07-02 | Discharge: 2020-07-02 | Disposition: A | Discharge status: Home / Self Care | Payer: PPO | Source: Ambulatory Visit | Attending: Gastroenterology | Admitting: Gastroenterology

## 2020-07-02 ENCOUNTER — Other Ambulatory Visit: Payer: Self-pay

## 2020-07-02 DIAGNOSIS — Z01812 Encounter for preprocedural laboratory examination: Secondary | ICD-10-CM | POA: Insufficient documentation

## 2020-07-02 DIAGNOSIS — Z20822 Contact with and (suspected) exposure to covid-19: Secondary | ICD-10-CM | POA: Diagnosis not present

## 2020-07-02 LAB — SARS CORONAVIRUS 2 (TAT 6-24 HRS): SARS Coronavirus 2: NEGATIVE

## 2020-07-04 ENCOUNTER — Encounter
Admission: RE | Disposition: A | Discharge status: Home / Self Care | Payer: Self-pay | Source: Home / Self Care | Attending: Gastroenterology

## 2020-07-04 ENCOUNTER — Other Ambulatory Visit: Payer: Self-pay

## 2020-07-04 ENCOUNTER — Encounter: Payer: Self-pay | Admitting: Gastroenterology

## 2020-07-04 ENCOUNTER — Telehealth: Payer: Self-pay

## 2020-07-04 ENCOUNTER — Ambulatory Visit: Payer: PPO | Admitting: Certified Registered Nurse Anesthetist

## 2020-07-04 ENCOUNTER — Ambulatory Visit
Admission: RE | Admit: 2020-07-04 | Discharge: 2020-07-04 | Disposition: A | Discharge status: Home / Self Care | Payer: PPO | Attending: Gastroenterology | Admitting: Gastroenterology

## 2020-07-04 DIAGNOSIS — Z888 Allergy status to other drugs, medicaments and biological substances status: Secondary | ICD-10-CM | POA: Insufficient documentation

## 2020-07-04 DIAGNOSIS — Z881 Allergy status to other antibiotic agents status: Secondary | ICD-10-CM | POA: Insufficient documentation

## 2020-07-04 DIAGNOSIS — Z1211 Encounter for screening for malignant neoplasm of colon: Secondary | ICD-10-CM | POA: Diagnosis not present

## 2020-07-04 DIAGNOSIS — K5140 Inflammatory polyps of colon without complications: Secondary | ICD-10-CM | POA: Diagnosis not present

## 2020-07-04 DIAGNOSIS — D122 Benign neoplasm of ascending colon: Secondary | ICD-10-CM | POA: Insufficient documentation

## 2020-07-04 DIAGNOSIS — Z87891 Personal history of nicotine dependence: Secondary | ICD-10-CM | POA: Diagnosis not present

## 2020-07-04 DIAGNOSIS — E785 Hyperlipidemia, unspecified: Secondary | ICD-10-CM | POA: Diagnosis not present

## 2020-07-04 DIAGNOSIS — Z79899 Other long term (current) drug therapy: Secondary | ICD-10-CM | POA: Insufficient documentation

## 2020-07-04 DIAGNOSIS — K5730 Diverticulosis of large intestine without perforation or abscess without bleeding: Secondary | ICD-10-CM | POA: Insufficient documentation

## 2020-07-04 DIAGNOSIS — I10 Essential (primary) hypertension: Secondary | ICD-10-CM | POA: Diagnosis not present

## 2020-07-04 DIAGNOSIS — Z8673 Personal history of transient ischemic attack (TIA), and cerebral infarction without residual deficits: Secondary | ICD-10-CM | POA: Diagnosis not present

## 2020-07-04 DIAGNOSIS — K635 Polyp of colon: Secondary | ICD-10-CM | POA: Diagnosis not present

## 2020-07-04 DIAGNOSIS — Z7982 Long term (current) use of aspirin: Secondary | ICD-10-CM | POA: Insufficient documentation

## 2020-07-04 DIAGNOSIS — K5790 Diverticulosis of intestine, part unspecified, without perforation or abscess without bleeding: Secondary | ICD-10-CM | POA: Diagnosis not present

## 2020-07-04 HISTORY — PX: COLONOSCOPY WITH PROPOFOL: SHX5780

## 2020-07-04 SURGERY — COLONOSCOPY WITH PROPOFOL
Anesthesia: General

## 2020-07-04 MED ORDER — PROPOFOL 500 MG/50ML IV EMUL
INTRAVENOUS | Status: DC | PRN
  Administered 2020-07-04: 100 ug/kg/min via INTRAVENOUS

## 2020-07-04 MED ORDER — SODIUM CHLORIDE 0.9 % IV SOLN: INTRAVENOUS | Status: DC

## 2020-07-04 MED ORDER — SODIUM CHLORIDE (PF) 0.9 % IJ SOLN
INTRAMUSCULAR | Status: DC | PRN
  Administered 2020-07-04: 6 mL

## 2020-07-04 MED ORDER — PROPOFOL 10 MG/ML IV BOLUS
INTRAVENOUS | Status: DC | PRN
  Administered 2020-07-04 (×6): 30 mg via INTRAVENOUS
  Administered 2020-07-04: 50 mg via INTRAVENOUS

## 2020-07-04 MED ORDER — PHENYLEPHRINE HCL (PRESSORS) 10 MG/ML IV SOLN
INTRAVENOUS | Status: DC | PRN
  Administered 2020-07-04: 100 ug via INTRAVENOUS

## 2020-07-04 MED ORDER — SPOT INK MARKER SYRINGE KIT
PACK | SUBMUCOSAL | Status: DC | PRN
  Administered 2020-07-04: 3 mL via SUBMUCOSAL

## 2020-07-04 MED ORDER — PHENYLEPHRINE HCL (PRESSORS) 10 MG/ML IV SOLN
INTRAVENOUS | Status: AC
  Filled 2020-07-04: qty 1

## 2020-07-04 MED ORDER — LIDOCAINE HCL (CARDIAC) PF 100 MG/5ML IV SOSY
PREFILLED_SYRINGE | INTRAVENOUS | Status: DC | PRN
  Administered 2020-07-04: 100 mg via INTRAVENOUS

## 2020-07-04 NOTE — Op Note (Signed)
[facility] Gastroenterology Patient Name: Sean Mullins Procedure Date: 07/04/2020 8:34 AM MRN: [id number] Account #: [id number] Date of Birth: 10/11/1950 Admit Type: Outpatient Age: 70 Room: [facility] ENDO ROOM 2 Gender: Male Note Status: Finalized Procedure:             Colonoscopy Indications:           Screening for colorectal malignant neoplasm Providers:             Daleon Willinger B. Bonna Gains MD, MD Referring MD:          [name], MD (Referring MD) Medicines:             Monitored Anesthesia Care Complications:         No immediate complications. Procedure:             Pre-Anesthesia Assessment:                        - ASA Grade Assessment: II - A patient with mild                         systemic disease.                        - Prior to the procedure, a History and Physical was                         performed, and patient medications, allergies and                         sensitivities were reviewed. The patient's tolerance                         of previous anesthesia was reviewed.                        - The risks and benefits of the procedure and the                         sedation options and risks were discussed with the                         patient. All questions were answered and informed                         consent was obtained.                        - Patient identification and proposed procedure were                         verified prior to the procedure by the physician, the                         nurse, the anesthesiologist, the anesthetist and the                         technician. The procedure was verified in the                         procedure  room.                        After obtaining informed consent, the colonoscope was                         passed under direct vision. Throughout the procedure,                         the patient's blood pressure, pulse, and oxygen                         saturations were  monitored continuously. The                         Colonoscope was introduced through the anus and                         advanced to the the cecum, identified by appendiceal                         orifice and ileocecal valve. The colonoscopy was                         performed with ease. The patient tolerated the                         procedure well. The quality of the bowel preparation                         was fair except the rectum was good, the sigmoid colon                         was good, the descending colon was good and the                         transverse colon was good. Findings:      The perianal and digital rectal examinations were normal.      A 15 mm polyp was found in the ascending colon. The polyp was sessile.       Area was successfully injected with 5 mL saline for lesion assessment,       and this injection appeared to lift the lesion adequately. The polyp was       removed with a saline injection-lift technique using a hot snare.       Resection and retrieval were complete. To prevent bleeding after the       polypectomy, one hemostatic clip was successfully placed. There was no       bleeding at the end of the procedure.      Two flat and sessile polyps were found in the sigmoid colon and       ascending colon. The polyps were 3 to 4 mm in size. These polyps were       removed with a jumbo cold forceps. Resection and retrieval were complete.      A single small-mouthed diverticulum was found in the sigmoid colon.       Peri-diverticular erythema was seen. Area was tattooed with an injection       of 3 mL of Spot (  carbon black).      Multiple diverticula were found in the sigmoid colon.      The exam was otherwise without abnormality.      The rectum, sigmoid colon, descending colon, transverse colon, ascending       colon and cecum appeared normal.      The retroflexed view of the distal rectum and anal verge was normal and       showed no anal or rectal  abnormalities. Impression:            - One 15 mm polyp in the ascending colon, removed                         using injection-lift and a hot snare. Resected and                         retrieved. Injected. Clip was placed.                        - Two 3 to 4 mm polyps in the sigmoid colon and in the                         ascending colon, removed with a jumbo cold forceps.                         Resected and retrieved.                        - Diverticulosis in the sigmoid colon.                         Peri-diverticular erythema was seen. Tattooed.                        - Diverticulosis in the sigmoid colon.                        - The examination was otherwise normal.                        - The rectum, sigmoid colon, descending colon,                         transverse colon, ascending colon and cecum are normal.                        - The distal rectum and anal verge are normal on                         retroflexion view. Recommendation:        - Perform a flexible sigmoidoscopy to evaluate                         diverticulum with possible polyp next to it in 4 weeks.                        - Discharge patient to home (with escort).                        -  Advance diet as tolerated.                        - Continue present medications.                        - Await pathology results.                        - Repeat colonoscopy date to be determined after                         pending pathology results are reviewed.                        - The findings and recommendations were discussed with                         the patient.                        - The findings and recommendations were discussed with                         the patient's family.                        - Return to primary care physician as previously                         scheduled.                        - High fiber diet. Procedure Code(s):     --- Professional ---                         [phone], Colonoscopy, flexible; with removal of                         tumor(s), polyp(s), or other lesion(s) by snare                         technique                        [id number], Colonoscopy, flexible; with directed submucosal                         injection(s), any substance                        [id number], 37, Colonoscopy, flexible; with biopsy, single                         or multiple Diagnosis Code(s):     --- Professional ---                        K63.5, Polyp of colon                        Z12.11, Encounter for screening for malignant neoplasm  of colon CPT copyright 2019 American Medical Association. All rights reserved. The codes documented in this report are preliminary and upon coder review may  be revised to meet current compliance requirements.  Vonda Antigua, MD Margretta Sidle B. Bonna Gains MD, MD 07/04/2020 9:48:00 AM This report has been signed electronically. Number of Addenda: 0 Note Initiated On: 07/04/2020 8:34 AM Scope Withdrawal Time: 0 hours 34 minutes 49 seconds  Total Procedure Duration: 0 hours 42 minutes 32 seconds  Estimated Blood Loss:  Estimated blood loss: none.      [facility]

## 2020-07-04 NOTE — H&P (Signed)
Sean Antigua, MD [address], [place], [place], [place], [address] [address], [place], [place], [place], [address] Phone: [phone]  Fax: [phone]  Primary Care Physician:  [name], MD   Pre-Procedure History & Physical: HPI:  Sean Mullins is a 71 y.o. male is here for a colonoscopy.   Past Medical History:  Diagnosis Date  . Anxiety   . Arthritis   . Diverticula of colon   . TIA (transient ischemic attack)    November 2013    Past Surgical History:  Procedure Laterality Date  . DUODENAL DIVERTICULECTOMY     2003  . KNEE ARTHROPLASTY Left     Prior to Admission medications   Medication Sig Start Date End Date Taking? Authorizing Provider  amLODipine (NORVASC) 5 MG tablet TAKE 1 TABLET BY MOUTH ONCE A DAY 06/26/20  Yes Burns, Claudina Lick, MD  aspirin EC 81 MG tablet Take 1 tablet (81 mg total) by mouth daily. 04/26/15  Yes Hillary Bow, MD  butalbital-acetaminophen-caffeine (FIORICET) [phone] MG tablet Take 1 tablet by mouth every 6 (six) hours as needed for headache. 06/15/19  Yes Dohmeier, Asencion Partridge, MD  EPINEPHrine 0.3 mg/0.3 mL IJ SOAJ injection Inject 0.3 mg into the muscle as needed for anaphylaxis. 05/12/20  Yes Burns, Claudina Lick, MD  lisinopril (ZESTRIL) 20 MG tablet TAKE 1 TABLET BY MOUTH ONCE DAILY 02/20/20  Yes Burns, Claudina Lick, MD  Multiple Vitamin (MULTIVITAMIN WITH MINERALS) TABS tablet Take 1 tablet by mouth daily.   Yes [provider]  rosuvastatin (CRESTOR) 10 MG tablet TAKE 1 TABLET BY MOUTH ONCE DAILY 05/21/20  Yes Burns, Claudina Lick, MD  Na Sulfate-K Sulfate-Mg Sulf 17.5-3.13-1.6 GM/177ML SOLN At 5 PM the day before procedure take 1 bottle and 5 hours before procedure take 1 bottle. Patient not taking: Reported on 07/04/2020 05/14/20   Virgel Manifold, MD    Allergies as of 05/14/2020 - Review Complete 05/14/2020  Allergen Reaction Noted  . Ciprofloxacin hcl Anaphylaxis 05/07/2020  . Bystolic [nebivolol hcl] Other (See  Comments) 08/28/2019    Family History  Problem Relation Age of Onset  . Transient ischemic attack Father     Social History   Socioeconomic History  . Marital status: Married    Spouse name: Not on file  . Number of children: Not on file  . Years of education: Not on file  . Highest education level: Not on file  Occupational History  . Not on file  Tobacco Use  . Smoking status: Former Smoker    Packs/day: 1.50    Years: 30.00    Pack years: 45.00    Quit date: 06/07/1996    Years since quitting: 24.0  . Smokeless tobacco: Never Used  Vaping Use  . Vaping Use: Never used  Substance and Sexual Activity  . Alcohol use: Yes    Comment: ocassional   . Drug use: Never  . Sexual activity: Not on file  Other Topics Concern  . Not on file  Social History Narrative   No regular exercise.    Social Determinants of Health   Financial Resource Strain: Low Risk   . Difficulty of Paying Living Expenses: Not hard at all  Food Insecurity: No Food Insecurity  . Worried About Charity fundraiser in the Last Year: Never true  . Ran Out of Food in the Last Year: Never true  Transportation Needs: No Transportation Needs  . Lack of Transportation (Medical): No  . Lack of Transportation (  Non-Medical): No  Physical Activity: Sufficiently Active  . Days of Exercise per Week: 5 days  . Minutes of Exercise per Session: 30 min  Stress: No Stress Concern Present  . Feeling of Stress : Not at all  Social Connections: Socially Integrated  . Frequency of Communication with Friends and Family: More than three times a week  . Frequency of Social Gatherings with Friends and Family: More than three times a week  . Attends Religious Services: More than 4 times per year  . Active Member of Clubs or Organizations: Yes  . Attends Archivist Meetings: More than 4 times per year  . Marital Status: Married  Human resources officer Violence: Not on file    Review of Systems: See HPI, otherwise  negative ROS  Physical Exam: There were no vitals taken for this visit. General:   Alert,  pleasant and cooperative in NAD Head:  Normocephalic and atraumatic. Neck:  Supple; no masses or thyromegaly. Lungs:  Clear throughout to auscultation, normal respiratory effort.    Heart:  +S1, +S2, Regular rate and rhythm, No edema. Abdomen:  Soft, nontender and nondistended. Normal bowel sounds, without guarding, and without rebound.   Neurologic:  Alert and  oriented x4;  grossly normal neurologically.  Impression/Plan: Sean Mullins is here for a colonoscopy to be performed for average risk screening.  Risks, benefits, limitations, and alternatives regarding  colonoscopy have been reviewed with the patient.  Questions have been answered.  All parties agreeable.   Virgel Manifold, MD  07/04/2020, 8:02 AM

## 2020-07-04 NOTE — Telephone Encounter (Signed)
Called patient but had to leave him a voicemail to schedule a flex sigmoid.

## 2020-07-04 NOTE — Anesthesia Preprocedure Evaluation (Signed)
Anesthesia Evaluation  Patient identified by MRN, date of birth, ID band Patient awake    Reviewed: Allergy & Precautions, H&P , NPO status , Patient's Chart, lab work & pertinent test results, reviewed documented beta blocker date and time   History of Anesthesia Complications Negative for: history of anesthetic complications  Airway Mallampati: III  TM Distance: >3 FB Neck ROM: full    Dental  (+) Dental Advidsory Given, Implants, Teeth Intact, Missing   Pulmonary neg shortness of breath, sleep apnea , neg COPD, neg recent URI, former smoker,    Pulmonary exam normal breath sounds clear to auscultation       Cardiovascular Exercise Tolerance: Good hypertension, (-) angina+ CAD  (-) Past MI and (-) Cardiac Stents Normal cardiovascular exam(-) dysrhythmias (-) Valvular Problems/Murmurs Rhythm:regular Rate:Normal     Neuro/Psych  Headaches, neg Seizures PSYCHIATRIC DISORDERS Anxiety TIA   GI/Hepatic negative GI ROS, Neg liver ROS,   Endo/Other  diabetes (borderline)  Renal/GU negative Renal ROS  negative genitourinary   Musculoskeletal   Abdominal   Peds  Hematology negative hematology ROS (+)   Anesthesia Other Findings Past Medical History: No date: Anxiety No date: Arthritis No date: Diverticula of colon No date: TIA (transient ischemic attack)     Comment:  November 2013   Reproductive/Obstetrics negative OB ROS                             Anesthesia Physical Anesthesia Plan  ASA: III  Anesthesia Plan: General   Post-op Pain Management:    Induction: Intravenous  PONV Risk Score and Plan: 2 and TIVA and Propofol infusion  Airway Management Planned: Natural Airway and Nasal Cannula  Additional Equipment:   Intra-op Plan:   Post-operative Plan:   Informed Consent: I have reviewed the patients History and Physical, chart, labs and discussed the procedure including the  risks, benefits and alternatives for the proposed anesthesia with the patient or authorized representative who has indicated his/her understanding and acceptance.     Dental Advisory Given  Plan Discussed with: Anesthesiologist, CRNA and Surgeon  Anesthesia Plan Comments:         Anesthesia Quick Evaluation

## 2020-07-04 NOTE — Anesthesia Postprocedure Evaluation (Signed)
Anesthesia Post Note  Patient: Sean Mullins  Procedure(s) Performed: COLONOSCOPY WITH PROPOFOL (N/A )  Patient location during evaluation: Endoscopy Anesthesia Type: General Level of consciousness: awake and alert Pain management: pain level controlled Vital Signs Assessment: post-procedure vital signs reviewed and stable Respiratory status: spontaneous breathing, nonlabored ventilation, respiratory function stable and patient connected to nasal cannula oxygen Cardiovascular status: blood pressure returned to baseline and stable Postop Assessment: no apparent nausea or vomiting Anesthetic complications: no   No complications documented.   Last Vitals:  Vitals:   07/04/20 0939 07/04/20 0949  BP: 126/76 137/77  Pulse: 63 61  Resp: 17 16  Temp:    SpO2: 96% 96%    Last Pain:  Vitals:   07/04/20 0949  TempSrc:   PainSc: 0-No pain                 Martha Clan

## 2020-07-04 NOTE — Transfer of Care (Signed)
Immediate Anesthesia Transfer of Care Note  Patient: GUIDO COMP  Procedure(s) Performed: COLONOSCOPY WITH PROPOFOL (N/A )  Patient Location: PACU and Endoscopy Unit  Anesthesia Type:General  Level of Consciousness: awake, drowsy and patient cooperative  Airway & Oxygen Therapy: Patient Spontanous Breathing  Post-op Assessment: Report given to RN and Post -op Vital signs reviewed and stable  Post vital signs: Reviewed and stable  Last Vitals:  Vitals Value Taken Time  BP 113/78 07/04/20 0930  Temp 37.1 C 07/04/20 0929  Pulse 70 07/04/20 0932  Resp 10 07/04/20 0932  SpO2 96 % 07/04/20 0932  Vitals shown include unvalidated device data.  Last Pain:  Vitals:   07/04/20 0929  TempSrc: Temporal  PainSc: 0-No pain         Complications: No complications documented.

## 2020-07-07 ENCOUNTER — Encounter: Payer: Self-pay | Admitting: Gastroenterology

## 2020-07-07 LAB — SURGICAL PATHOLOGY

## 2020-07-29 ENCOUNTER — Telehealth: Payer: Self-pay

## 2020-07-29 ENCOUNTER — Other Ambulatory Visit: Payer: Self-pay

## 2020-07-29 DIAGNOSIS — R933 Abnormal findings on diagnostic imaging of other parts of digestive tract: Secondary | ICD-10-CM

## 2020-07-29 NOTE — Telephone Encounter (Signed)
Called and got patient scheduled for 08/05/2020 went over instructions with Patient and sent to [facility]

## 2020-07-29 NOTE — Telephone Encounter (Signed)
-----   Message from Virgel Manifold, MD sent at 07/08/2020 12:28 PM EST ----- Sean Mullins please let the patient know, his pathology report showed precancerous polyps that were removed.  He needs a repeat flexible sigmoidoscopy to evaluate the area of abnormality seen in the sigmoid colon which was next to a diverticulum.  This needs to be reevaluated to see if it has resolved or if it is a polyp at that site that needs to be removed.  Please schedule in 6-8 weeks from now.  Please tell him this will be a shortened colonoscopy, but order regular oral prep.  His follow-up clinic appointment can be moved to be 2 to 3 weeks after his procedure

## 2020-07-29 NOTE — Telephone Encounter (Signed)
Called and left a message for call back. Sent mychart message  °

## 2020-08-01 ENCOUNTER — Other Ambulatory Visit: Payer: Self-pay

## 2020-08-01 ENCOUNTER — Other Ambulatory Visit
Admission: RE | Admit: 2020-08-01 | Discharge: 2020-08-01 | Disposition: A | Discharge status: Home / Self Care | Payer: PPO | Source: Ambulatory Visit | Attending: Gastroenterology | Admitting: Gastroenterology

## 2020-08-01 DIAGNOSIS — Z01812 Encounter for preprocedural laboratory examination: Secondary | ICD-10-CM | POA: Diagnosis not present

## 2020-08-01 DIAGNOSIS — Z20822 Contact with and (suspected) exposure to covid-19: Secondary | ICD-10-CM | POA: Diagnosis not present

## 2020-08-01 LAB — SARS CORONAVIRUS 2 (TAT 6-24 HRS): SARS Coronavirus 2: NEGATIVE

## 2020-08-05 ENCOUNTER — Encounter
Admission: RE | Disposition: A | Discharge status: Home / Self Care | Payer: Self-pay | Source: Home / Self Care | Attending: Gastroenterology

## 2020-08-05 ENCOUNTER — Other Ambulatory Visit: Payer: Self-pay

## 2020-08-05 ENCOUNTER — Ambulatory Visit: Payer: PPO | Admitting: Anesthesiology

## 2020-08-05 ENCOUNTER — Ambulatory Visit
Admission: RE | Admit: 2020-08-05 | Discharge: 2020-08-05 | Disposition: A | Discharge status: Home / Self Care | Payer: PPO | Attending: Gastroenterology | Admitting: Gastroenterology

## 2020-08-05 ENCOUNTER — Encounter: Payer: Self-pay | Admitting: Gastroenterology

## 2020-08-05 DIAGNOSIS — K648 Other hemorrhoids: Secondary | ICD-10-CM | POA: Diagnosis not present

## 2020-08-05 DIAGNOSIS — Z87891 Personal history of nicotine dependence: Secondary | ICD-10-CM | POA: Diagnosis not present

## 2020-08-05 DIAGNOSIS — Z79899 Other long term (current) drug therapy: Secondary | ICD-10-CM | POA: Diagnosis not present

## 2020-08-05 DIAGNOSIS — I10 Essential (primary) hypertension: Secondary | ICD-10-CM | POA: Diagnosis not present

## 2020-08-05 DIAGNOSIS — K5730 Diverticulosis of large intestine without perforation or abscess without bleeding: Secondary | ICD-10-CM | POA: Insufficient documentation

## 2020-08-05 DIAGNOSIS — R948 Abnormal results of function studies of other organs and systems: Secondary | ICD-10-CM | POA: Insufficient documentation

## 2020-08-05 DIAGNOSIS — R933 Abnormal findings on diagnostic imaging of other parts of digestive tract: Secondary | ICD-10-CM

## 2020-08-05 DIAGNOSIS — Z980 Intestinal bypass and anastomosis status: Secondary | ICD-10-CM | POA: Insufficient documentation

## 2020-08-05 DIAGNOSIS — G4733 Obstructive sleep apnea (adult) (pediatric): Secondary | ICD-10-CM | POA: Diagnosis not present

## 2020-08-05 DIAGNOSIS — K5790 Diverticulosis of intestine, part unspecified, without perforation or abscess without bleeding: Secondary | ICD-10-CM | POA: Diagnosis not present

## 2020-08-05 DIAGNOSIS — E785 Hyperlipidemia, unspecified: Secondary | ICD-10-CM | POA: Diagnosis not present

## 2020-08-05 DIAGNOSIS — Z8673 Personal history of transient ischemic attack (TIA), and cerebral infarction without residual deficits: Secondary | ICD-10-CM | POA: Diagnosis not present

## 2020-08-05 DIAGNOSIS — Z7982 Long term (current) use of aspirin: Secondary | ICD-10-CM | POA: Insufficient documentation

## 2020-08-05 DIAGNOSIS — K922 Gastrointestinal hemorrhage, unspecified: Secondary | ICD-10-CM | POA: Diagnosis not present

## 2020-08-05 HISTORY — PX: FLEXIBLE SIGMOIDOSCOPY: SHX5431

## 2020-08-05 SURGERY — SIGMOIDOSCOPY, FLEXIBLE
Anesthesia: General

## 2020-08-05 MED ORDER — PHENYLEPHRINE HCL (PRESSORS) 10 MG/ML IV SOLN
INTRAVENOUS | Status: DC | PRN
  Administered 2020-08-05 (×2): 50 ug via INTRAVENOUS
  Administered 2020-08-05 (×3): 100 ug via INTRAVENOUS

## 2020-08-05 MED ORDER — GLYCOPYRROLATE 0.2 MG/ML IJ SOLN
INTRAMUSCULAR | Status: AC
  Filled 2020-08-05: qty 1

## 2020-08-05 MED ORDER — SODIUM CHLORIDE 0.9 % IV SOLN: INTRAVENOUS | Status: DC

## 2020-08-05 MED ORDER — PROPOFOL 500 MG/50ML IV EMUL
INTRAVENOUS | Status: DC | PRN
  Administered 2020-08-05: 140 ug/kg/min via INTRAVENOUS

## 2020-08-05 MED ORDER — PROPOFOL 10 MG/ML IV BOLUS
INTRAVENOUS | Status: DC | PRN
  Administered 2020-08-05: 10 mg via INTRAVENOUS
  Administered 2020-08-05: 20 mg via INTRAVENOUS
  Administered 2020-08-05: 50 mg via INTRAVENOUS
  Administered 2020-08-05: 20 mg via INTRAVENOUS

## 2020-08-05 MED ORDER — GLYCOPYRROLATE 0.2 MG/ML IJ SOLN
INTRAMUSCULAR | Status: DC | PRN
  Administered 2020-08-05: .2 mg via INTRAVENOUS

## 2020-08-05 MED ORDER — LIDOCAINE HCL (CARDIAC) PF 100 MG/5ML IV SOSY
PREFILLED_SYRINGE | INTRAVENOUS | Status: DC | PRN
  Administered 2020-08-05: 100 mg via INTRAVENOUS

## 2020-08-05 NOTE — Anesthesia Procedure Notes (Signed)
Procedure Name: MAC Date/Time: 08/05/2020 11:24 AM Performed by: Lily Peer, Kayleena Eke, CRNA Pre-anesthesia Checklist: Patient identified, Emergency Drugs available, Suction available and Timeout performed Oxygen Delivery Method: Nasal cannula Induction Type: IV induction

## 2020-08-05 NOTE — Anesthesia Postprocedure Evaluation (Signed)
Anesthesia Post Note  Patient: Sean Mullins  Procedure(s) Performed: FLEXIBLE SIGMOIDOSCOPY (N/A )  Patient location during evaluation: Endoscopy Anesthesia Type: General Level of consciousness: awake and alert Pain management: pain level controlled Vital Signs Assessment: post-procedure vital signs reviewed and stable Respiratory status: spontaneous breathing, nonlabored ventilation, respiratory function stable and patient connected to nasal cannula oxygen Cardiovascular status: blood pressure returned to baseline and stable Postop Assessment: no apparent nausea or vomiting Anesthetic complications: no   No complications documented.   Last Vitals:  Vitals:   08/05/20 1211 08/05/20 1219  BP: 123/76 128/83  Pulse: 68   Resp: 12   Temp:    SpO2: 98%     Last Pain:  Vitals:   08/05/20 1221  TempSrc:   PainSc: 0-No pain                 Precious Haws Whitten Andreoni

## 2020-08-05 NOTE — Op Note (Addendum)
[facility] Gastroenterology Patient Name: Sean Mullins Procedure Date: 08/05/2020 11:23 AM MRN: [id number] Account #: [id number] Date of Birth: [birth year] Admit Type: Outpatient Age: 70 Room: [facility] ENDO ROOM 3 Gender: Male Note Status: Finalized Procedure:             Flexible Sigmoidoscopy Indications:           Abnormal CT of the GI tract, Nodule vs inflammatory                         polyp seen next to a diverticulum on last colonoscopy Providers:             Varnita B. Bonna Gains MD, MD Referring MD:          [name], MD (Referring MD) Medicines:             General Anesthesia Complications:         No immediate complications. Procedure:             Pre-Anesthesia Assessment:                        - Prior to the procedure, a History and Physical was                         performed, and patient medications and allergies were                         reviewed. The patient is competent. The risks and                         benefits of the procedure and the sedation options and                         risks were discussed with the patient. All questions                         were answered and informed consent was obtained.                         Patient identification and proposed procedure were                         verified by the physician, the nurse, the                         anesthesiologist, the anesthetist and the technician                         in the pre-procedure area in the procedure room in the                         endoscopy suite. Mental Status Examination: alert and                         oriented. Airway Examination: normal oropharyngeal                         airway and neck mobility. Respiratory Examination:  clear to auscultation. CV Examination: normal.                         Prophylactic Antibiotics: The patient does not require                         prophylactic antibiotics. Prior  Anticoagulants: The                         patient has taken no previous anticoagulant or                         antiplatelet agents. ASA Grade Assessment: II - A                         patient with mild systemic disease. After reviewing                         the risks and benefits, the patient was deemed in                         satisfactory condition to undergo the procedure. The                         anesthesia plan was to use general anesthesia.                         Immediately prior to administration of medications,                         the patient was re-assessed for adequacy to receive                         sedatives. The heart rate, respiratory rate, oxygen                         saturations, blood pressure, adequacy of pulmonary                         ventilation, and response to care were monitored                         throughout the procedure. The physical status of the                         patient was re-assessed after the procedure.                        After obtaining informed consent, the scope was passed                         under direct vision. The Colonoscope was introduced                         through the anus and advanced to the the splenic                         flexure. The flexible sigmoidoscopy was accomplished  with ease. The patient tolerated the procedure well.                         The quality of the bowel preparation was adequate. Findings:      The perianal and digital rectal examinations were normal.      A tattoo was seen in the sigmoid colon. The tattoo site appeared normal.      Previously seen lesion noted on last colonoscopy 4 weeks ago was not       present on this exam and was likely a benign inflammatory polyp next to       a diverticulum that has resolved.      Multiple small and large-mouthed diverticula were found in the sigmoid       colon.      There was evidence of a prior surgical  anastomosis in the sigmoid colon.       This was patent and was characterized by healthy appearing mucosa.      Non-bleeding internal hemorrhoids were found during retroflexion. Impression:            - A tattoo was seen in the sigmoid colon. The tattoo                         site appeared normal.                        - Diverticulosis in the sigmoid colon.                        - Patent surgical anastomosis, characterized by                         healthy appearing mucosa.                        - Non-bleeding internal hemorrhoids.                        - No specimens collected. Recommendation:        - Surveillance colonoscopy in 3 years due to polyps                         seen in Jan 2022 colonoscopy.                        - Continue present medications.                        - Resume previous diet.                        - Return to primary care physician as previously                         scheduled.                        - The findings and recommendations were discussed with                         the patient.                        -  The findings and recommendations were discussed with                         the patient's family. Procedure Code(s):     --- Professional ---                        [phone], Sigmoidoscopy, flexible; diagnostic, including                         collection of specimen(s) by brushing or washing, when                         performed (separate procedure) Diagnosis Code(s):     --- Professional ---                        R93.3, Abnormal findings on diagnostic imaging of                         other parts of digestive tract CPT copyright 2019 American Medical Association. All rights reserved. The codes documented in this report are preliminary and upon coder review may  be revised to meet current compliance requirements.  Vonda Antigua, MD Margretta Sidle B. Bonna Gains MD, MD 08/05/2020 12:02:53 PM This report has been signed  electronically. Number of Addenda: 0 Note Initiated On: 08/05/2020 11:23 AM Total Procedure Duration: 0 hours 17 minutes 6 seconds       [facility]

## 2020-08-05 NOTE — Transfer of Care (Signed)
Immediate Anesthesia Transfer of Care Note  Patient: Sean Mullins  Procedure(s) Performed: FLEXIBLE SIGMOIDOSCOPY (N/A )  Patient Location: PACU and Endoscopy Unit  Anesthesia Type:General  Level of Consciousness: drowsy  Airway & Oxygen Therapy: Patient Spontanous Breathing  Post-op Assessment: Report given to RN and Post -op Vital signs reviewed and stable  Post vital signs: Reviewed and stable  Last Vitals:  Vitals Value Taken Time  BP 90/53 08/05/20 1151  Temp 35.8 C 08/05/20 1151  Pulse 65 08/05/20 1153  Resp 14 08/05/20 1153  SpO2 95 % 08/05/20 1153  Vitals shown include unvalidated device data.  Last Pain:  Vitals:   08/05/20 1151  TempSrc: Temporal  PainSc: Asleep         Complications: No complications documented.

## 2020-08-05 NOTE — Progress Notes (Signed)
No risk at this time. 

## 2020-08-05 NOTE — H&P (Signed)
Sean Antigua, MD [address] [place], [place], [place], [address] [address], [place], [place], [place], [address] Phone: [phone]  Fax: [phone]  Primary Care Physician:  [name], MD   Pre-Procedure History & Physical: HPI:  Sean Mullins is a 70 y.o. male is here for a colonoscopy/flexible sigmoidoscopy.   Past Medical History:  Diagnosis Date  . Anxiety   . Arthritis   . Diverticula of colon   . TIA (transient ischemic attack)    November 2013    Past Surgical History:  Procedure Laterality Date  . COLONOSCOPY WITH PROPOFOL N/A 07/04/2020   Procedure: COLONOSCOPY WITH PROPOFOL;  Surgeon: Virgel Manifold, MD;  Location: [facility] ENDOSCOPY;  Service: Endoscopy;  Laterality: N/A;  . DUODENAL DIVERTICULECTOMY     2003  . KNEE ARTHROPLASTY Left     Prior to Admission medications   Medication Sig Start Date End Date Taking? Authorizing Provider  amLODipine (NORVASC) 5 MG tablet TAKE 1 TABLET BY MOUTH ONCE A DAY 06/26/20  Yes Burns, Claudina Lick, MD  aspirin EC 81 MG tablet Take 1 tablet (81 mg total) by mouth daily. 04/26/15  Yes Sudini, Alveta Heimlich, MD  lisinopril (ZESTRIL) 20 MG tablet TAKE 1 TABLET BY MOUTH ONCE DAILY 02/20/20  Yes Burns, Claudina Lick, MD  Multiple Vitamin (MULTIVITAMIN WITH MINERALS) TABS tablet Take 1 tablet by mouth daily.   Yes [provider]  rosuvastatin (CRESTOR) 10 MG tablet TAKE 1 TABLET BY MOUTH ONCE DAILY 05/21/20  Yes Burns, Claudina Lick, MD  butalbital-acetaminophen-caffeine (FIORICET) 50-325-40 MG tablet Take 1 tablet by mouth every 6 (six) hours as needed for headache. 06/15/19   Dohmeier, Asencion Partridge, MD  EPINEPHrine 0.3 mg/0.3 mL IJ SOAJ injection Inject 0.3 mg into the muscle as needed for anaphylaxis. 05/12/20   Burns, Claudina Lick, MD  Na Sulfate-K Sulfate-Mg Sulf 17.5-3.13-1.6 GM/177ML SOLN At 5 PM the day before procedure take 1 bottle and 5 hours before procedure take 1 bottle. Patient not taking: Reported on 07/04/2020 05/14/20    Virgel Manifold, MD    Allergies as of 07/30/2020 - Review Complete 07/04/2020  Allergen Reaction Noted  . Ciprofloxacin hcl Anaphylaxis 05/07/2020  . Bystolic [nebivolol hcl] Other (See Comments) 08/28/2019    Family History  Problem Relation Age of Onset  . Transient ischemic attack Father     Social History   Socioeconomic History  . Marital status: Married    Spouse name: Not on file  . Number of children: Not on file  . Years of education: Not on file  . Highest education level: Not on file  Occupational History  . Not on file  Tobacco Use  . Smoking status: Former Smoker    Packs/day: 1.50    Years: 30.00    Pack years: 45.00    Quit date: 06/07/1996    Years since quitting: 24.1  . Smokeless tobacco: Never Used  Vaping Use  . Vaping Use: Never used  Substance and Sexual Activity  . Alcohol use: Yes    Comment: ocassional   . Drug use: Never  . Sexual activity: Not on file  Other Topics Concern  . Not on file  Social History Narrative   No regular exercise.    Social Determinants of Health   Financial Resource Strain: Low Risk   . Difficulty of Paying Living Expenses: Not hard at all  Food Insecurity: No Food Insecurity  . Worried About Charity fundraiser in the Last Year: Never true  .  Ran Out of Food in the Last Year: Never true  Transportation Needs: No Transportation Needs  . Lack of Transportation (Medical): No  . Lack of Transportation (Non-Medical): No  Physical Activity: Sufficiently Active  . Days of Exercise per Week: 5 days  . Minutes of Exercise per Session: 30 min  Stress: No Stress Concern Present  . Feeling of Stress : Not at all  Social Connections: Socially Integrated  . Frequency of Communication with Friends and Family: More than three times a week  . Frequency of Social Gatherings with Friends and Family: More than three times a week  . Attends Religious Services: More than 4 times per year  . Active Member of Clubs or  Organizations: Yes  . Attends Archivist Meetings: More than 4 times per year  . Marital Status: Married  Human resources officer Violence: Not on file    Review of Systems: See HPI, otherwise negative ROS  Physical Exam: BP 140/74   Pulse 64   Temp (!) 96.8 F (36 C) (Temporal)   Resp 18   Ht 6' 2.5" (1.892 m)   Wt 127.5 kg   SpO2 97%   BMI 35.60 kg/m  General:   Alert,  pleasant and cooperative in NAD Head:  Normocephalic and atraumatic. Neck:  Supple; no masses or thyromegaly. Lungs:  Clear throughout to auscultation, normal respiratory effort.    Heart:  +S1, +S2, Regular rate and rhythm, No edema. Abdomen:  Soft, nontender and nondistended. Normal bowel sounds, without guarding, and without rebound.   Neurologic:  Alert and  oriented x4;  grossly normal neurologically.  Impression/Plan: Sean Mullins is here for a colonoscopy/flexible sigmoidoscopy to be performed for follow up of sigmoid colon nodule or inflamed polyp seen on last procedure.  Risks, benefits, limitations, and alternatives regarding  colonoscopy have been reviewed with the patient.  Questions have been answered.  All parties agreeable.   Virgel Manifold, MD  08/05/2020, 11:15 AM

## 2020-08-05 NOTE — Anesthesia Preprocedure Evaluation (Addendum)
Anesthesia Evaluation  Patient identified by MRN, date of birth, ID band Patient awake    Reviewed: Allergy & Precautions, H&P , NPO status , Patient's Chart, lab work & pertinent test results, reviewed documented beta blocker date and time   History of Anesthesia Complications Negative for: history of anesthetic complications  Airway Mallampati: III  TM Distance: >3 FB Neck ROM: full    Dental  (+) Dental Advidsory Given, Implants, Missing   Pulmonary neg shortness of breath, sleep apnea , neg COPD, neg recent URI, former smoker,    Pulmonary exam normal breath sounds clear to auscultation       Cardiovascular Exercise Tolerance: Good hypertension, (-) angina+ CAD  (-) Past MI and (-) Cardiac Stents Normal cardiovascular exam(-) dysrhythmias (-) Valvular Problems/Murmurs Rhythm:regular Rate:Normal     Neuro/Psych  Headaches, neg Seizures PSYCHIATRIC DISORDERS Anxiety TIA   GI/Hepatic negative GI ROS, Neg liver ROS,   Endo/Other  diabetes (borderline)  Renal/GU negative Renal ROS  negative genitourinary   Musculoskeletal   Abdominal   Peds  Hematology negative hematology ROS (+)   Anesthesia Other Findings Past Medical History: No date: Anxiety No date: Arthritis No date: Diverticula of colon No date: TIA (transient ischemic attack)     Comment:  November 2013   Reproductive/Obstetrics negative OB ROS                            Anesthesia Physical  Anesthesia Plan  ASA: III  Anesthesia Plan: General   Post-op Pain Management:    Induction: Intravenous  PONV Risk Score and Plan: 2 and TIVA and Propofol infusion  Airway Management Planned: Natural Airway and Nasal Cannula  Additional Equipment:   Intra-op Plan:   Post-operative Plan:   Informed Consent: I have reviewed the patients History and Physical, chart, labs and discussed the procedure including the risks, benefits  and alternatives for the proposed anesthesia with the patient or authorized representative who has indicated his/her understanding and acceptance.     Dental Advisory Given  Plan Discussed with: Anesthesiologist, CRNA and Surgeon  Anesthesia Plan Comments: (Patient consented for risks of anesthesia including but not limited to:  - adverse reactions to medications - risk of airway placement if required - damage to eyes, teeth, lips or other oral mucosa - nerve damage due to positioning  - sore throat or hoarseness - Damage to heart, brain, nerves, lungs, other parts of body or loss of life  Patient voiced understanding.)        Anesthesia Quick Evaluation

## 2020-08-07 ENCOUNTER — Encounter: Payer: Self-pay | Admitting: Gastroenterology

## 2020-08-13 ENCOUNTER — Ambulatory Visit: Payer: PPO | Admitting: Gastroenterology

## 2020-11-20 ENCOUNTER — Other Ambulatory Visit: Payer: Self-pay | Admitting: Internal Medicine

## 2021-02-26 ENCOUNTER — Other Ambulatory Visit: Payer: Self-pay | Admitting: Internal Medicine

## 2021-03-22 ENCOUNTER — Encounter: Payer: Self-pay | Admitting: Internal Medicine

## 2021-03-22 NOTE — Patient Instructions (Addendum)
    Flu immunization administered today.     Blood work was ordered.      Medications changes include :   none    Please follow up for a physical exam

## 2021-03-22 NOTE — Progress Notes (Signed)
Subjective:    Patient ID: Sean Mullins, male    DOB: December 15, 1950, 70 y.o.   MRN: [id number]  This visit occurred during the SARS-CoV-2 public health emergency.  Safety protocols were in place, including screening questions prior to the visit, additional usage of staff PPE, and extensive cleaning of exam room while observing appropriate contact time as indicated for disinfecting solutions.    HPI The patient is here for an acute visit.  He is here with his wife today who helps with his history.  Frequent nocturia - Q 1 hour - this started a couple of weeks ago.  He goes normally during the day - 3-4 times.  Prior to two weeks ago he could go 6 hrs w/o going to the bathroom.  Because he is up some much he is not sleeping well and is fatigued.  Slight headache, frequent thirst and hunger.   Has been off the celebrex since GI bleed and everything hurts.  He wonders if he can go back on this.  All he wants to do is sleep - that started several months ago.  He is not using his cpap.      Medications and allergies reviewed with patient and updated if appropriate.  Patient Active Problem List   Diagnosis Date Noted   Colitis 05/06/2020   Lower GI bleed    Complex sleep apnea syndrome 08/16/2019   Chronic intermittent hypoxia with obstructive sleep apnea 08/16/2019   Migraine 05/15/2019   Diverticulitis 02/28/2019   BPPV (benign paroxysmal positional vertigo) 01/25/2016   Primary hypertension 04/29/2015   History of TIA (transient ischemic attack) 04/29/2015   Hyperlipidemia 04/29/2015   Diabetes ([place]) 04/29/2015   Obese 05/17/2008   OBSTRUCTIVE SLEEP APNEA, severe 05/17/2008   RESTLESS LEG SYNDROME 05/17/2008   Coronary atherosclerosis 05/17/2008   Osteoarthritis 05/17/2008    Current Outpatient Medications on File Prior to Visit  Medication Sig Dispense Refill   amLODipine (NORVASC) 5 MG tablet TAKE 1 TABLET BY MOUTH ONCE A DAY 90 tablet 3   aspirin EC 81 MG tablet  Take 1 tablet (81 mg total) by mouth daily. 30 tablet 0   butalbital-acetaminophen-caffeine (FIORICET) 50-325-40 MG tablet Take 1 tablet by mouth every 6 (six) hours as needed for headache. 10 tablet 3   EPINEPHrine 0.3 mg/0.3 mL IJ SOAJ injection Inject 0.3 mg into the muscle as needed for anaphylaxis. 1 each 2   fluticasone (FLONASE) 50 MCG/ACT nasal spray Place 2 sprays into both nostrils daily.     lisinopril (ZESTRIL) 20 MG tablet TAKE 1 TABLET BY MOUTH ONCE DAILY 90 tablet 1   Multiple Vitamin (MULTIVITAMIN WITH MINERALS) TABS tablet Take 1 tablet by mouth daily.     rosuvastatin (CRESTOR) 10 MG tablet TAKE 1 TABLET BY MOUTH ONCE DAILY 60 tablet 0   No current facility-administered medications on file prior to visit.    Past Medical History:  Diagnosis Date   Anxiety    Arthritis    Diverticula of colon    TIA (transient ischemic attack)    November 2013    Past Surgical History:  Procedure Laterality Date   COLONOSCOPY WITH PROPOFOL N/A 07/04/2020   Procedure: COLONOSCOPY WITH PROPOFOL;  Surgeon: Virgel Manifold, MD;  Location: [facility] ENDOSCOPY;  Service: Endoscopy;  Laterality: N/A;   DUODENAL DIVERTICULECTOMY     2003   FLEXIBLE SIGMOIDOSCOPY N/A 08/05/2020   Procedure: FLEXIBLE SIGMOIDOSCOPY;  Surgeon: Virgel Manifold, MD;  Location: [facility] ENDOSCOPY;  Service: Endoscopy;  Laterality: N/A;   KNEE ARTHROPLASTY Left     Social History   Socioeconomic History   Marital status: Married    Spouse name: Not on file   Number of children: Not on file   Years of education: Not on file   Highest education level: Not on file  Occupational History   Not on file  Tobacco Use   Smoking status: Former    Packs/day: 1.50    Years: 30.00    Pack years: 45.00    Types: Cigarettes    Quit date: 06/07/1996    Years since quitting: 24.8   Smokeless tobacco: Never  Vaping Use   Vaping Use: Never used  Substance and Sexual Activity   Alcohol use: Yes    Comment: ocassional     Drug use: Never   Sexual activity: Not on file  Other Topics Concern   Not on file  Social History Narrative   No regular exercise.    Social Determinants of Health   Financial Resource Strain: Not on file  Food Insecurity: Not on file  Transportation Needs: Not on file  Physical Activity: Not on file  Stress: Not on file  Social Connections: Not on file    Family History  Problem Relation Age of Onset   Transient ischemic attack Father     Review of Systems  Constitutional:  Positive for fatigue. Negative for chills and fever.  HENT:  Positive for trouble swallowing (thin liquids only).   Respiratory:  Positive for cough (occ tickle in throat) and shortness of breath (with exertion). Negative for wheezing.   Cardiovascular:  Negative for chest pain, palpitations and leg swelling.  Gastrointestinal:  Negative for abdominal pain.       No gerd  Endocrine: Positive for polydipsia and polyuria.  Genitourinary:  Positive for frequency and urgency. Negative for difficulty urinating, dysuria, flank pain and hematuria.  Psychiatric/Behavioral:  Positive for sleep disturbance (from nocturia).       Objective:   Vitals:   03/23/21 0916  BP: 120/70  Pulse: 69  Temp: 98.3 F (36.8 C)  SpO2: 94%   BP Readings from Last 3 Encounters:  03/23/21 120/70  08/05/20 128/83  07/04/20 137/77   Wt Readings from Last 3 Encounters:  03/23/21 286 lb (129.7 kg)  08/05/20 281 lb (127.5 kg)  07/04/20 282 lb (127.9 kg)   Body mass index is 36.23 kg/m.   Physical Exam    Constitutional: Appears well-developed and well-nourished. No distress.  Head: Normocephalic and atraumatic.  Neck: Neck supple. No tracheal deviation present. No thyromegaly present.  No cervical lymphadenopathy Cardiovascular: Normal rate, regular rhythm and normal heart sounds.  No murmur heard. No carotid bruit .  No edema Pulmonary/Chest: Effort normal and breath sounds normal. No respiratory distress. No has  no wheezes. No rales.  Skin: Skin is warm and dry. Not diaphoretic.  Psychiatric: Normal mood and affect. Behavior is normal.       Assessment & Plan:    See Problem List for Assessment and Plan of chronic medical problems.

## 2021-03-23 ENCOUNTER — Ambulatory Visit (INDEPENDENT_AMBULATORY_CARE_PROVIDER_SITE_OTHER): Payer: PPO | Admitting: Internal Medicine

## 2021-03-23 ENCOUNTER — Other Ambulatory Visit: Payer: Self-pay

## 2021-03-23 VITALS — BP 120/70 | HR 69 | Temp 98.3°F | Ht 74.5 in | Wt 286.0 lb

## 2021-03-23 DIAGNOSIS — I10 Essential (primary) hypertension: Secondary | ICD-10-CM | POA: Diagnosis not present

## 2021-03-23 DIAGNOSIS — E7849 Other hyperlipidemia: Secondary | ICD-10-CM

## 2021-03-23 DIAGNOSIS — G4733 Obstructive sleep apnea (adult) (pediatric): Secondary | ICD-10-CM | POA: Diagnosis not present

## 2021-03-23 DIAGNOSIS — E119 Type 2 diabetes mellitus without complications: Secondary | ICD-10-CM

## 2021-03-23 DIAGNOSIS — R5383 Other fatigue: Secondary | ICD-10-CM | POA: Insufficient documentation

## 2021-03-23 DIAGNOSIS — R351 Nocturia: Secondary | ICD-10-CM

## 2021-03-23 DIAGNOSIS — Z125 Encounter for screening for malignant neoplasm of prostate: Secondary | ICD-10-CM | POA: Insufficient documentation

## 2021-03-23 DIAGNOSIS — R413 Other amnesia: Secondary | ICD-10-CM

## 2021-03-23 LAB — CBC WITH DIFFERENTIAL/PLATELET
Basophils Absolute: 0 10*3/uL (ref 0.0–0.1)
Basophils Relative: 0.5 % (ref 0.0–3.0)
Eosinophils Absolute: 0.2 10*3/uL (ref 0.0–0.7)
Eosinophils Relative: 3 % (ref 0.0–5.0)
HCT: 44.5 % (ref 39.0–52.0)
Hemoglobin: 15.2 g/dL (ref 13.0–17.0)
Lymphocytes Relative: 36.6 % (ref 12.0–46.0)
Lymphs Abs: 2.3 10*3/uL (ref 0.7–4.0)
MCHC: 34.2 g/dL (ref 30.0–36.0)
MCV: 92.1 fl (ref 78.0–100.0)
Monocytes Absolute: 0.6 10*3/uL (ref 0.1–1.0)
Monocytes Relative: 9.6 % (ref 3.0–12.0)
Neutro Abs: 3.2 10*3/uL (ref 1.4–7.7)
Neutrophils Relative %: 50.3 % (ref 43.0–77.0)
Platelets: 220 10*3/uL (ref 150.0–400.0)
RBC: 4.83 Mil/uL (ref 4.22–5.81)
RDW: 13 % (ref 11.5–15.5)
WBC: 6.4 10*3/uL (ref 4.0–10.5)

## 2021-03-23 LAB — COMPREHENSIVE METABOLIC PANEL
ALT: 48 U/L (ref 0–53)
AST: 48 U/L — ABNORMAL HIGH (ref 0–37)
Albumin: 4.4 g/dL (ref 3.5–5.2)
Alkaline Phosphatase: 63 U/L (ref 39–117)
BUN: 13 mg/dL (ref 6–23)
CO2: 26 mEq/L (ref 19–32)
Calcium: 9.7 mg/dL (ref 8.4–10.5)
Chloride: 102 mEq/L (ref 96–112)
Creatinine, Ser: 0.79 mg/dL (ref 0.40–1.50)
GFR: 91.55 mL/min (ref >60.00)
Glucose, Bld: 134 mg/dL — ABNORMAL HIGH (ref 70–99)
Potassium: 4.2 mEq/L (ref 3.5–5.1)
Sodium: 136 mEq/L (ref 135–145)
Total Bilirubin: 1.3 mg/dL — ABNORMAL HIGH (ref 0.2–1.2)
Total Protein: 7.5 g/dL (ref 6.0–8.3)

## 2021-03-23 LAB — LIPID PANEL
Cholesterol: 118 mg/dL (ref 0–200)
HDL: 36.7 mg/dL — ABNORMAL LOW (ref >39.00)
LDL Cholesterol: 51 mg/dL (ref 0–99)
NonHDL: 81.37
Total CHOL/HDL Ratio: 3
Triglycerides: 153 mg/dL — ABNORMAL HIGH (ref 0.0–149.0)
VLDL: 30.6 mg/dL (ref 0.0–40.0)

## 2021-03-23 LAB — HEMOGLOBIN A1C: Hgb A1c MFr Bld: 7 % — ABNORMAL HIGH (ref 4.6–6.5)

## 2021-03-23 LAB — TSH: TSH: 1.49 u[IU]/mL (ref 0.35–5.50)

## 2021-03-23 LAB — VITAMIN B12: Vitamin B-12: 462 pg/mL (ref 211–911)

## 2021-03-23 NOTE — Assessment & Plan Note (Signed)
Acute His wife states some memory issues - he feels this is related to old age Check tsh, b12 Discussed this may be partially from untreated osa

## 2021-03-23 NOTE — Assessment & Plan Note (Signed)
new Past two weeks likely related to nocturia and not sleeping enough Untreated osa likely contributing - has had fatigue for a while Check labs - cbc, cmp, tsh Advised seeing sleep clinic to see what other options there are for his OSA

## 2021-03-23 NOTE — Assessment & Plan Note (Signed)
Acute Two weeks of frequent nocturia ? Related to BPH or elevated sugars Urine dip showed blood, but no infection - will send for cx Check psa, a1c Decrease fluid intake in evening Treatment depending on above

## 2021-03-23 NOTE — Assessment & Plan Note (Signed)
Chronic Blood pressure well controlled CMP Continue amlodipine 5 mg daily, lisinopril 20 mg daily 

## 2021-03-23 NOTE — Assessment & Plan Note (Signed)
Chronic Was severe in 2021 Not on cpap now - did not tolerate it Discussed consequences of untreated sleep apnea - ? Candidate for inspire or something else - recommended following up with sleep clinic to see what other options there are Discussed this is likely contributing to his fatigue, but may not be the only cause

## 2021-03-23 NOTE — Assessment & Plan Note (Signed)
Chronic Has been well controlled with diet Symptoms now may be consistent with DM  a1c Encouraged weight loss, low carb/sugar diet

## 2021-03-23 NOTE — Assessment & Plan Note (Signed)
chronic Continue crestor 10 mg daily Check lipids

## 2021-03-24 LAB — PSA, TOTAL AND FREE
PSA, % Free: 25 % (calc) — ABNORMAL LOW (ref >25)
PSA, Free: 0.2 ng/mL
PSA, Total: 0.8 ng/mL (ref <=4.0)

## 2021-03-24 LAB — POC URINALSYSI DIPSTICK (AUTOMATED)
Bilirubin, UA: NEGATIVE
Glucose, UA: NEGATIVE
Ketones, UA: NEGATIVE
Leukocytes, UA: NEGATIVE
Nitrite, UA: NEGATIVE
Protein, UA: NEGATIVE
Spec Grav, UA: 1.025 (ref 1.010–1.025)
Urobilinogen, UA: 0.2 E.U./dL
pH, UA: 6 (ref 5.0–8.0)

## 2021-03-24 NOTE — Addendum Note (Signed)
Addended by: Marcina Millard on: 03/24/2021 04:17 PM   Modules accepted: Orders

## 2021-03-25 LAB — CULTURE, URINE COMPREHENSIVE: RESULT:: NO GROWTH

## 2021-04-15 ENCOUNTER — Encounter: Payer: PPO | Admitting: Internal Medicine

## 2021-05-06 ENCOUNTER — Other Ambulatory Visit: Payer: Self-pay | Admitting: Internal Medicine

## 2021-05-19 ENCOUNTER — Encounter: Payer: Self-pay | Admitting: Internal Medicine

## 2021-05-19 NOTE — Progress Notes (Signed)
Subjective:    Patient ID: Sean Mullins, male    DOB: 1951-04-20, 70 y.o.   MRN: [id number]   This visit occurred during the SARS-CoV-2 public health emergency.  Safety protocols were in place, including screening questions prior to the visit, additional usage of staff PPE, and extensive cleaning of exam room while observing appropriate contact time as indicated for disinfecting solutions.   HPI He is here for a physical exam.   He has chronic arthritic pain, but otherwise has no concerns.  Medications and allergies reviewed with patient and updated if appropriate.  Patient Active Problem List   Diagnosis Date Noted   Fatigue 03/23/2021   Memory difficulties 03/23/2021   Colitis 05/06/2020   Lower GI bleed    Complex sleep apnea syndrome 08/16/2019   Chronic intermittent hypoxia with obstructive sleep apnea 08/16/2019   Migraine 05/15/2019   Diverticulitis 02/28/2019   BPPV (benign paroxysmal positional vertigo) 01/25/2016   Primary hypertension 04/29/2015   History of TIA (transient ischemic attack) 04/29/2015   Hyperlipidemia 04/29/2015   Diabetes ([place]) 04/29/2015   Nocturia 08/12/2009   Obese 05/17/2008   OBSTRUCTIVE SLEEP APNEA, severe 05/17/2008   RESTLESS LEG SYNDROME 05/17/2008   Coronary atherosclerosis 05/17/2008   Osteoarthritis 05/17/2008    Current Outpatient Medications on File Prior to Visit  Medication Sig Dispense Refill   amLODipine (NORVASC) 5 MG tablet TAKE 1 TABLET BY MOUTH ONCE A DAY 90 tablet 3   aspirin EC 81 MG tablet Take 1 tablet (81 mg total) by mouth daily. 30 tablet 0   butalbital-acetaminophen-caffeine (FIORICET) 50-325-40 MG tablet Take 1 tablet by mouth every 6 (six) hours as needed for headache. 10 tablet 3   EPINEPHrine 0.3 mg/0.3 mL IJ SOAJ injection Inject 0.3 mg into the muscle as needed for anaphylaxis. 1 each 2   fluticasone (FLONASE) 50 MCG/ACT nasal spray Place 2 sprays into both nostrils daily.     lisinopril (ZESTRIL) 20  MG tablet TAKE 1 TABLET BY MOUTH ONCE DAILY 90 tablet 1   Multiple Vitamin (MULTIVITAMIN WITH MINERALS) TABS tablet Take 1 tablet by mouth daily.     rosuvastatin (CRESTOR) 10 MG tablet TAKE 1 TABLET BY MOUTH ONCE DAILY 60 tablet 0   No current facility-administered medications on file prior to visit.    Past Medical History:  Diagnosis Date   Anxiety    Arthritis    Diverticula of colon    TIA (transient ischemic attack)    November 2013    Past Surgical History:  Procedure Laterality Date   COLONOSCOPY WITH PROPOFOL N/A 07/04/2020   Procedure: COLONOSCOPY WITH PROPOFOL;  Surgeon: Virgel Manifold, MD;  Location: [facility] ENDOSCOPY;  Service: Endoscopy;  Laterality: N/A;   DUODENAL DIVERTICULECTOMY     2003   FLEXIBLE SIGMOIDOSCOPY N/A 08/05/2020   Procedure: FLEXIBLE SIGMOIDOSCOPY;  Surgeon: Virgel Manifold, MD;  Location: [facility] ENDOSCOPY;  Service: Endoscopy;  Laterality: N/A;   KNEE ARTHROPLASTY Left     Social History   Socioeconomic History   Marital status: Married    Spouse name: Not on file   Number of children: Not on file   Years of education: Not on file   Highest education level: Not on file  Occupational History   Not on file  Tobacco Use   Smoking status: Former    Packs/day: 1.50    Years: 30.00    Pack years: 45.00    Types: Cigarettes    Quit date: 06/07/1996  Years since quitting: 24.9   Smokeless tobacco: Never  Vaping Use   Vaping Use: Never used  Substance and Sexual Activity   Alcohol use: Yes    Comment: ocassional    Drug use: Never   Sexual activity: Not on file  Other Topics Concern   Not on file  Social History Narrative   No regular exercise.    Social Determinants of Health   Financial Resource Strain: Low Risk    Difficulty of Paying Living Expenses: Not hard at all  Food Insecurity: No Food Insecurity   Worried About Charity fundraiser in the Last Year: Never true   [place] in the Last Year: Never true   Transportation Needs: No Transportation Needs   Lack of Transportation (Medical): No   Lack of Transportation (Non-Medical): No  Physical Activity: Inactive   Days of Exercise per Week: 0 days   Minutes of Exercise per Session: 0 min  Stress: No Stress Concern Present   Feeling of Stress : Not at all  Social Connections: Socially Integrated   Frequency of Communication with Friends and Family: More than three times a week   Frequency of Social Gatherings with Friends and Family: More than three times a week   Attends Religious Services: More than 4 times per year   Active Member of [employer] or Organizations: Yes   Attends Music therapist: More than 4 times per year   Marital Status: Married    Family History  Problem Relation Age of Onset   Transient ischemic attack Father     Review of Systems  Constitutional:  Negative for chills and fever.  Eyes:  Negative for visual disturbance.  Respiratory:  Negative for cough, shortness of breath and wheezing.   Cardiovascular:  Negative for chest pain, palpitations and leg swelling.  Gastrointestinal:  Negative for abdominal pain, blood in stool, constipation, diarrhea and nausea.       No gerd  Genitourinary:  Negative for difficulty urinating and dysuria.  Musculoskeletal:  Positive for arthralgias.  Skin:  Negative for rash.  Neurological:  Negative for light-headedness and headaches.  Psychiatric/Behavioral:  Negative for dysphoric mood. The patient is not nervous/anxious.       Objective:   Vitals:   05/20/21 0811  BP: 132/80  Pulse: 70  Temp: 98.1 F (36.7 C)  SpO2: 94%   Filed Weights   05/20/21 0811  Weight: 291 lb (132 kg)   Body mass index is 36.86 kg/m.  BP Readings from Last 3 Encounters:  05/20/21 132/80  05/20/21 132/80  03/23/21 120/70    Wt Readings from Last 3 Encounters:  05/20/21 291 lb (132 kg)  05/20/21 291 lb (132 kg)  03/23/21 286 lb (129.7 kg)     Physical  Exam Constitutional: He appears well-developed and well-nourished. No distress.  HENT:  Head: Normocephalic and atraumatic.  Right Ear: External ear normal.  Left Ear: External ear normal.  Mouth/Throat: Oropharynx is clear and moist.  Normal ear canals and TM b/l  Eyes: Conjunctivae and EOM are normal.  Neck: Neck supple. No tracheal deviation present. No thyromegaly present.  No carotid bruit  Cardiovascular: Normal rate, regular rhythm, normal heart sounds and intact distal pulses.   No murmur heard. Pulmonary/Chest: Effort normal and breath sounds normal. No respiratory distress. He has no wheezes. He has no rales.  Abdominal: Soft. He exhibits no distension. There is no tenderness.  Genitourinary: deferred  Musculoskeletal: He exhibits no edema.  Lymphadenopathy:   He has no cervical adenopathy.  Skin: Skin is warm and dry. He is not diaphoretic.  Psychiatric: He has a normal mood and affect. His behavior is normal.   Lab Results  Component Value Date   WBC 6.6 05/20/2021   HGB 15.2 05/20/2021   HCT 44.6 05/20/2021   PLT 228.0 05/20/2021   GLUCOSE 159 (H) 05/20/2021   CHOL 124 05/20/2021   TRIG 162.0 (H) 05/20/2021   HDL 36.30 (L) 05/20/2021   LDLCALC 55 05/20/2021   ALT 49 05/20/2021   AST 32 05/20/2021   NA 135 05/20/2021   K 4.9 05/20/2021   CL 100 05/20/2021   CREATININE 0.76 05/20/2021   BUN 13 05/20/2021   CO2 28 05/20/2021   TSH 1.32 05/20/2021   PSA 0.96 05/20/2021   INR 1.0 05/06/2020   HGBA1C 7.5 (H) 05/20/2021   MICROALBUR 1.2 05/20/2021         Assessment & Plan:   Physical exam: Screening blood work  ordered Exercise  - none - advised regular exercise Weight  advised weight loss - he is working on it Substance abuse   none   Reviewed recommended immunizations.   Health Maintenance  Topic Date Due   OPHTHALMOLOGY EXAM  04/11/2019   FOOT EXAM  08/28/2019   Zoster Vaccines- Shingrix (2 of 2) 05/18/2021   COVID-19 Vaccine (1) 06/05/2021  (Originally 08/19/1951)   HEMOGLOBIN A1C  11/18/2021   TETANUS/TDAP  06/08/2023   COLONOSCOPY (Pts 45-55yrs Insurance coverage will need to be confirmed)  07/05/2023   Pneumonia Vaccine 63+ Years old  Completed   INFLUENZA VACCINE  Completed   Hepatitis C Screening  Completed   HPV VACCINES  Aged Out     See Problem List for Assessment and Plan of chronic medical problems.

## 2021-05-19 NOTE — Patient Instructions (Addendum)
Blood work was ordered.     Medications changes include :   start rybelsus 3 mg daily - we can increase the dose after one month  Your prescription(s) have been submitted to your pharmacy. Please take as directed and contact our office if you believe you are having problem(s) with the medication(s).    Please followup in 6 months    Health Maintenance, Male Adopting a healthy lifestyle and getting preventive care are important in promoting health and wellness. Ask your health care provider about: The right schedule for you to have regular tests and exams. Things you can do on your own to prevent diseases and keep yourself healthy. What should I know about diet, weight, and exercise? Eat a healthy diet  Eat a diet that includes plenty of vegetables, fruits, low-fat dairy products, and lean protein. Do not eat a lot of foods that are high in solid fats, added sugars, or sodium. Maintain a healthy weight Body mass index (BMI) is a measurement that can be used to identify possible weight problems. It estimates body fat based on height and weight. Your health care provider can help determine your BMI and help you achieve or maintain a healthy weight. Get regular exercise Get regular exercise. This is one of the most important things you can do for your health. Most adults should: Exercise for at least 150 minutes each week. The exercise should increase your heart rate and make you sweat (moderate-intensity exercise). Do strengthening exercises at least twice a week. This is in addition to the moderate-intensity exercise. Spend less time sitting. Even light physical activity can be beneficial. Watch cholesterol and blood lipids Have your blood tested for lipids and cholesterol at 68 years of age, then have this test every 5 years. You may need to have your cholesterol levels checked more often if: Your lipid or cholesterol levels are high. You are older than 68 years of age. You are at  high risk for heart disease. What should I know about cancer screening? Many types of cancers can be detected early and may often be prevented. Depending on your health history and family history, you may need to have cancer screening at various ages. This may include screening for: Colorectal cancer. Prostate cancer. Skin cancer. Lung cancer. What should I know about heart disease, diabetes, and high blood pressure? Blood pressure and heart disease High blood pressure causes heart disease and increases the risk of stroke. This is more likely to develop in people who have high blood pressure readings or are overweight. Talk with your health care provider about your target blood pressure readings. Have your blood pressure checked: Every 3-5 years if you are 55-95 years of age. Every year if you are 32 years old or older. If you are between the ages of 3 and 57 and are a current or former smoker, ask your health care provider if you should have a one-time screening for abdominal aortic aneurysm (AAA). Diabetes Have regular diabetes screenings. This checks your fasting blood sugar level. Have the screening done: Once every three years after age 56 if you are at a normal weight and have a low risk for diabetes. More often and at a younger age if you are overweight or have a high risk for diabetes. What should I know about preventing infection? Hepatitis B If you have a higher risk for hepatitis B, you should be screened for this virus. Talk with your health care provider to find out if you are  at risk for hepatitis B infection. Hepatitis C Blood testing is recommended for: Everyone born from [birth year] through 1963. Anyone with known risk factors for hepatitis C. Sexually transmitted infections (STIs) You should be screened each year for STIs, including gonorrhea and chlamydia, if: You are sexually active and are younger than 68 years of age. You are older than 68 years of age and your health  care provider tells you that you are at risk for this type of infection. Your sexual activity has changed since you were last screened, and you are at increased risk for chlamydia or gonorrhea. Ask your health care provider if you are at risk. Ask your health care provider about whether you are at high risk for HIV. Your health care provider may recommend a prescription medicine to help prevent HIV infection. If you choose to take medicine to prevent HIV, you should first get tested for HIV. You should then be tested every 3 months for as long as you are taking the medicine. Follow these instructions at home: Alcohol use Do not drink alcohol if your health care provider tells you not to drink. If you drink alcohol: Limit how much you have to 0-2 drinks a day. Know how much alcohol is in your drink. In the [place] one drink equals one 12 oz bottle of beer (355 mL), one 5 oz glass of wine (148 mL), or one 1 oz glass of hard liquor (44 mL). Lifestyle Do not use any products that contain nicotine or tobacco. These products include cigarettes, chewing tobacco, and vaping devices, such as e-cigarettes. If you need help quitting, ask your health care provider. Do not use street drugs. Do not share needles. Ask your health care provider for help if you need support or information about quitting drugs. General instructions Schedule regular health, dental, and eye exams. Stay current with your vaccines. Tell your health care provider if: You often feel depressed. You have ever been abused or do not feel safe at home. Summary Adopting a healthy lifestyle and getting preventive care are important in promoting health and wellness. Follow your health care provider's instructions about healthy diet, exercising, and getting tested or screened for diseases. Follow your health care provider's instructions on monitoring your cholesterol and blood pressure. This information is not intended to replace advice given  to you by your health care provider. Make sure you discuss any questions you have with your health care provider. Document Revised: 10/13/2020 Document Reviewed: 10/13/2020 Elsevier Patient Education  [place].

## 2021-05-20 ENCOUNTER — Ambulatory Visit (INDEPENDENT_AMBULATORY_CARE_PROVIDER_SITE_OTHER): Payer: PPO | Admitting: Internal Medicine

## 2021-05-20 ENCOUNTER — Other Ambulatory Visit: Payer: Self-pay

## 2021-05-20 ENCOUNTER — Ambulatory Visit (INDEPENDENT_AMBULATORY_CARE_PROVIDER_SITE_OTHER): Payer: PPO

## 2021-05-20 VITALS — BP 132/80 | HR 70 | Temp 98.1°F | Ht 74.5 in | Wt 291.0 lb

## 2021-05-20 DIAGNOSIS — E119 Type 2 diabetes mellitus without complications: Secondary | ICD-10-CM | POA: Diagnosis not present

## 2021-05-20 DIAGNOSIS — H269 Unspecified cataract: Secondary | ICD-10-CM | POA: Diagnosis not present

## 2021-05-20 DIAGNOSIS — I10 Essential (primary) hypertension: Secondary | ICD-10-CM | POA: Diagnosis not present

## 2021-05-20 DIAGNOSIS — Z0000 Encounter for general adult medical examination without abnormal findings: Secondary | ICD-10-CM

## 2021-05-20 DIAGNOSIS — G43109 Migraine with aura, not intractable, without status migrainosus: Secondary | ICD-10-CM | POA: Diagnosis not present

## 2021-05-20 DIAGNOSIS — E7849 Other hyperlipidemia: Secondary | ICD-10-CM | POA: Diagnosis not present

## 2021-05-20 DIAGNOSIS — Z8673 Personal history of transient ischemic attack (TIA), and cerebral infarction without residual deficits: Secondary | ICD-10-CM | POA: Diagnosis not present

## 2021-05-20 DIAGNOSIS — Z125 Encounter for screening for malignant neoplasm of prostate: Secondary | ICD-10-CM | POA: Diagnosis not present

## 2021-05-20 DIAGNOSIS — I2510 Atherosclerotic heart disease of native coronary artery without angina pectoris: Secondary | ICD-10-CM

## 2021-05-20 LAB — COMPREHENSIVE METABOLIC PANEL
ALT: 49 U/L (ref 0–53)
AST: 32 U/L (ref 0–37)
Albumin: 4.2 g/dL (ref 3.5–5.2)
Alkaline Phosphatase: 75 U/L (ref 39–117)
BUN: 13 mg/dL (ref 6–23)
CO2: 28 mEq/L (ref 19–32)
Calcium: 9.6 mg/dL (ref 8.4–10.5)
Chloride: 100 mEq/L (ref 96–112)
Creatinine, Ser: 0.76 mg/dL (ref 0.40–1.50)
GFR: 92.53 mL/min (ref >60.00)
Glucose, Bld: 159 mg/dL — ABNORMAL HIGH (ref 70–99)
Potassium: 4.9 mEq/L (ref 3.5–5.1)
Sodium: 135 mEq/L (ref 135–145)
Total Bilirubin: 0.9 mg/dL (ref 0.2–1.2)
Total Protein: 7.3 g/dL (ref 6.0–8.3)

## 2021-05-20 LAB — LIPID PANEL
Cholesterol: 124 mg/dL (ref 0–200)
HDL: 36.3 mg/dL — ABNORMAL LOW (ref >39.00)
LDL Cholesterol: 55 mg/dL (ref 0–99)
NonHDL: 87.39
Total CHOL/HDL Ratio: 3
Triglycerides: 162 mg/dL — ABNORMAL HIGH (ref 0.0–149.0)
VLDL: 32.4 mg/dL (ref 0.0–40.0)

## 2021-05-20 LAB — CBC WITH DIFFERENTIAL/PLATELET
Basophils Absolute: 0 10*3/uL (ref 0.0–0.1)
Basophils Relative: 0.3 % (ref 0.0–3.0)
Eosinophils Absolute: 0.3 10*3/uL (ref 0.0–0.7)
Eosinophils Relative: 4.5 % (ref 0.0–5.0)
HCT: 44.6 % (ref 39.0–52.0)
Hemoglobin: 15.2 g/dL (ref 13.0–17.0)
Lymphocytes Relative: 40 % (ref 12.0–46.0)
Lymphs Abs: 2.6 10*3/uL (ref 0.7–4.0)
MCHC: 34.1 g/dL (ref 30.0–36.0)
MCV: 91.6 fl (ref 78.0–100.0)
Monocytes Absolute: 0.6 10*3/uL (ref 0.1–1.0)
Monocytes Relative: 9.6 % (ref 3.0–12.0)
Neutro Abs: 3 10*3/uL (ref 1.4–7.7)
Neutrophils Relative %: 45.6 % (ref 43.0–77.0)
Platelets: 228 10*3/uL (ref 150.0–400.0)
RBC: 4.86 Mil/uL (ref 4.22–5.81)
RDW: 13.5 % (ref 11.5–15.5)
WBC: 6.6 10*3/uL (ref 4.0–10.5)

## 2021-05-20 LAB — PSA, MEDICARE: PSA: 0.96 ng/ml (ref 0.10–4.00)

## 2021-05-20 LAB — HEMOGLOBIN A1C: Hgb A1c MFr Bld: 7.5 % — ABNORMAL HIGH (ref 4.6–6.5)

## 2021-05-20 LAB — MICROALBUMIN / CREATININE URINE RATIO
Creatinine,U: 87.5 mg/dL
Microalb Creat Ratio: 1.4 mg/g (ref 0.0–30.0)
Microalb, Ur: 1.2 mg/dL (ref 0.0–1.9)

## 2021-05-20 LAB — TSH: TSH: 1.32 u[IU]/mL (ref 0.35–5.50)

## 2021-05-20 MED ORDER — RYBELSUS 3 MG PO TABS: 3.0000 mg | ORAL_TABLET | Freq: Every day | ORAL | 1 refills | Status: DC

## 2021-05-20 NOTE — Assessment & Plan Note (Signed)
Chronic BMI 36.86 kg/m with diabetes, hyperlipidemia, hypertension Stressed regular exercise, decrease portions, healthy diet and weight loss Will start Rybelsus 3 mg daily for better sugar control and to help with his weight loss

## 2021-05-20 NOTE — Assessment & Plan Note (Signed)
Chronic Regular exercise and healthy diet encouraged Check lipid panel  Continue rosuvastatin 10 mg daily 

## 2021-05-20 NOTE — Assessment & Plan Note (Signed)
Chronic No symptoms consistent with angina Following with cardiology Continue aspirin 81 mg daily, statin Blood pressure well controlled

## 2021-05-20 NOTE — Assessment & Plan Note (Signed)
Chronic Blood pressure well controlled CMP Continue amlodipine 5 mg daily, lisinopril 20 mg daily 

## 2021-05-20 NOTE — Assessment & Plan Note (Signed)
History of TIA On aspirin 81 mg daily, Crestor 10 mg daily Blood pressure well controlled Check lipid panel

## 2021-05-20 NOTE — Assessment & Plan Note (Signed)
Chronic Infrequent Has Fioricet to take if needed

## 2021-05-20 NOTE — Progress Notes (Signed)
Subjective:   Sean Mullins is a 70 y.o. male who presents for Medicare Annual/Subsequent preventive examination.  Review of Systems     Cardiac Risk Factors include: family history of premature cardiovascular disease;advanced age (>33men, >79 women);dyslipidemia;hypertension;male gender;obesity (BMI >30kg/m2)     Objective:    Today's Vitals   05/20/21 0945  BP: 132/80  Pulse: 70  Temp: 98.1 F (36.7 C)  SpO2: 94%  Weight: 291 lb (132 kg)  Height: 6' 2.5" (1.892 m)  PainSc: 0-No pain   Body mass index is 36.86 kg/m.  Advanced Directives 05/20/2021 08/05/2020 07/04/2020 05/06/2020 05/06/2020 05/06/2020 03/12/2020  Does Patient Have a Medical Advance Directive? No No No No No No Yes  Type of Advance Directive - - - - - - Living will  Does patient want to make changes to medical advance directive? No - Patient declined - - - - - No - Patient declined  Would patient like information on creating a medical advance directive? - - No - Patient declined No - Patient declined - - -    Current Medications (verified) Outpatient Encounter Medications as of 05/20/2021  Medication Sig   amLODipine (NORVASC) 5 MG tablet TAKE 1 TABLET BY MOUTH ONCE A DAY   aspirin EC 81 MG tablet Take 1 tablet (81 mg total) by mouth daily.   butalbital-acetaminophen-caffeine (FIORICET) 50-325-40 MG tablet Take 1 tablet by mouth every 6 (six) hours as needed for headache.   EPINEPHrine 0.3 mg/0.3 mL IJ SOAJ injection Inject 0.3 mg into the muscle as needed for anaphylaxis.   fluticasone (FLONASE) 50 MCG/ACT nasal spray Place 2 sprays into both nostrils daily.   lisinopril (ZESTRIL) 20 MG tablet TAKE 1 TABLET BY MOUTH ONCE DAILY   Multiple Vitamin (MULTIVITAMIN WITH MINERALS) TABS tablet Take 1 tablet by mouth daily.   rosuvastatin (CRESTOR) 10 MG tablet TAKE 1 TABLET BY MOUTH ONCE DAILY   No facility-administered encounter medications on file as of 05/20/2021.    Allergies (verified) Ciprofloxacin  hcl and Bystolic [nebivolol hcl]   History: Past Medical History:  Diagnosis Date   Anxiety    Arthritis    Diverticula of colon    TIA (transient ischemic attack)    November 2013   Past Surgical History:  Procedure Laterality Date   COLONOSCOPY WITH PROPOFOL N/A 07/04/2020   Procedure: COLONOSCOPY WITH PROPOFOL;  Surgeon: Virgel Manifold, MD;  Location: [facility] ENDOSCOPY;  Service: Endoscopy;  Laterality: N/A;   DUODENAL DIVERTICULECTOMY     2003   FLEXIBLE SIGMOIDOSCOPY N/A 08/05/2020   Procedure: FLEXIBLE SIGMOIDOSCOPY;  Surgeon: Virgel Manifold, MD;  Location: [facility] ENDOSCOPY;  Service: Endoscopy;  Laterality: N/A;   KNEE ARTHROPLASTY Left    Family History  Problem Relation Age of Onset   Transient ischemic attack Father    Social History   Socioeconomic History   Marital status: Married    Spouse name: Not on file   Number of children: Not on file   Years of education: Not on file   Highest education level: Not on file  Occupational History   Not on file  Tobacco Use   Smoking status: Former    Packs/day: 1.50    Years: 30.00    Pack years: 45.00    Types: Cigarettes    Quit date: 06/07/1996    Years since quitting: 24.9   Smokeless tobacco: Never  Vaping Use   Vaping Use: Never used  Substance and Sexual Activity   Alcohol use: Yes  Comment: ocassional    Drug use: Never   Sexual activity: Not on file  Other Topics Concern   Not on file  Social History Narrative   No regular exercise.    Social Determinants of Health   Financial Resource Strain: Low Risk    Difficulty of Paying Living Expenses: Not hard at all  Food Insecurity: No Food Insecurity   Worried About Charity fundraiser in the Last Year: Never true   [place] in the Last Year: Never true  Transportation Needs: No Transportation Needs   Lack of Transportation (Medical): No   Lack of Transportation (Non-Medical): No  Physical Activity: Inactive   Days of Exercise per Week:  0 days   Minutes of Exercise per Session: 0 min  Stress: No Stress Concern Present   Feeling of Stress : Not at all  Social Connections: Socially Integrated   Frequency of Communication with Friends and Family: More than three times a week   Frequency of Social Gatherings with Friends and Family: More than three times a week   Attends Religious Services: More than 4 times per year   Active Member of [employer] or Organizations: Yes   Attends Music therapist: More than 4 times per year   Marital Status: Married    Tobacco Counseling Counseling given: Not Answered   Clinical Intake:  Pre-visit preparation completed: Yes  Pain : No/denies pain Pain Score: 0-No pain     BMI - recorded: 36.86 Nutritional Status: BMI > 30  Obese Nutritional Risks: None Diabetes: No  How often do you need to have someone help you when you read instructions, pamphlets, or other written materials from your doctor or pharmacy?: 1 - Never What is the last grade level you completed in school?: High School Graduate; Retired Art gallery manager  Diabetic? no  Interpreter Needed?: No  Information entered by :: Lisette Abu, LPN   Activities of Daily Living In your present state of health, do you have any difficulty performing the following activities: 05/20/2021  Hearing? N  Vision? N  Difficulty concentrating or making decisions? N  Walking or climbing stairs? N  Dressing or bathing? N  Doing errands, shopping? N  Preparing Food and eating ? N  Using the Toilet? N  In the past six months, have you accidently leaked urine? N  Do you have problems with loss of bowel control? N  Managing your Medications? N  Managing your Finances? N  Housekeeping or managing your Housekeeping? N  Some recent data might be hidden    Patient Care Team: [name], MD as PCP - General (Internal Medicine) Charlton Haws, [facility] as Pharmacist (Pharmacist)  Indicate any recent Medical  Services you may have received from other than [facility] providers in the past year (date may be approximate).     Assessment:   This is a routine wellness examination for Sean Mullins.  Hearing/Vision screen Hearing Screening - Comments:: Patient denied any hearing difficulty. No hearing aids. Vision Screening - Comments:: Patient wears corrective glasses/contacts.  Eye exam done annually by: [facility] Patient requested referral for ophthalmologist in [place], [place] to evaluate for left eye cataract.  Dietary issues and exercise activities discussed: Current Exercise Habits: The patient does not participate in regular exercise at present, Exercise limited by: cardiac condition(s);orthopedic condition(s)   Goals Addressed               This Visit's Progress     Patient Stated (pt-stated)  My goal is to increase my physical activity.      Depression Screen PHQ 2/9 Scores 05/20/2021 03/12/2020 02/28/2020 02/28/2019 08/28/2018 08/24/2017  PHQ - 2 Score 0 0 0 0 0 0    Fall Risk Fall Risk  03/23/2021 03/12/2020 06/15/2019 02/28/2019 08/28/2018  Falls in the past year? 0 0 0 0 0  Number falls in past yr: 0 0 - 0 -  Injury with Fall? 0 0 - - -  Risk for fall due to : No Fall Risks No Fall Risks - - -  Follow up Falls evaluation completed Falls evaluation completed - - -    FALL RISK PREVENTION PERTAINING TO THE HOME:  Any stairs in or around the home? Yes  If so, are there any without handrails? No  Home free of loose throw rugs in walkways, pet beds, electrical cords, etc? Yes  Adequate lighting in your home to reduce risk of falls? Yes   ASSISTIVE DEVICES UTILIZED TO PREVENT FALLS:  Life alert? No  Use of a cane, walker or w/c? No  Grab bars in the bathroom? No  Shower chair or bench in shower? No  Elevated toilet seat or a handicapped toilet? Yes   TIMED UP AND GO:  Was the test performed? Yes .  Length of time to ambulate 10 feet: 5 sec.   Gait steady and fast without use of  assistive device  Cognitive Function: Normal cognitive status assessed by direct observation by this Nurse Health Advisor. No abnormalities found.          Immunizations Immunization History  Administered Date(s) Administered   Fluad Quad(high Dose 65+) 02/28/2019, 02/28/2020   Influenza, High Dose Seasonal PF 02/24/2018   Influenza,inj,Quad PF,6+ Mos 02/22/2017   Influenza-Unspecified 04/17/2015, 03/04/2021   Pneumococcal Conjugate-13 08/28/2018   Pneumococcal Polysaccharide-23 08/28/2019   Tdap 06/07/2013    TDAP status: Due, Education has been provided regarding the importance of this vaccine. Advised may receive this vaccine at local pharmacy or Health Dept. Aware to provide a copy of the vaccination record if obtained from local pharmacy or Health Dept. Verbalized acceptance and understanding.  Flu Vaccine status: Up to date  Pneumococcal vaccine status: Up to date  Covid-19 vaccine status: Declined, Education has been provided regarding the importance of this vaccine but patient still declined. Advised may receive this vaccine at local pharmacy or Health Dept.or vaccine clinic. Aware to provide a copy of the vaccination record if obtained from local pharmacy or Health Dept. Verbalized acceptance and understanding.  Qualifies for Shingles Vaccine? Yes   Zostavax completed No   Shingrix Completed?: Yes  Screening Tests Health Maintenance  Topic Date Due   OPHTHALMOLOGY EXAM  04/11/2019   FOOT EXAM  08/28/2019   COVID-19 Vaccine (1) 06/05/2021 (Originally 08/19/1951)   HEMOGLOBIN A1C  09/21/2021   TETANUS/TDAP  06/08/2023   COLONOSCOPY (Pts 45-41yrs Insurance coverage will need to be confirmed)  07/05/2023   Pneumonia Vaccine 28+ Years old  Completed   INFLUENZA VACCINE  Completed   Hepatitis C Screening  Completed   HPV VACCINES  Aged Out   Zoster Vaccines- Shingrix  Discontinued    Health Maintenance  Health Maintenance Due  Topic Date Due   OPHTHALMOLOGY EXAM   04/11/2019   FOOT EXAM  08/28/2019    Colorectal cancer screening: Type of screening: Sigmoidoscopy. Completed 08/05/2020. Repeat every 3 years  Lung Cancer Screening: (Low Dose CT Chest recommended if Age 6-80 years, 30 pack-year currently smoking OR have quit  w/in 15years.) does not qualify.   Lung Cancer Screening Referral: no  Additional Screening:  Hepatitis C Screening: does qualify; Completed yes  Vision Screening: Recommended annual ophthalmology exams for early detection of glaucoma and other disorders of the eye. Is the patient up to date with their annual eye exam?  Yes  Who is the provider or what is the name of the office in which the patient attends annual eye exams? [facility] If pt is not established with a provider, would they like to be referred to a provider to establish care? Yes .  (Referral to Ophthalmologist)  Dental Screening: Recommended annual dental exams for proper oral hygiene  Community Resource Referral / Chronic Care Management: CRR required this visit?  Yes   CCM required this visit?  No      Plan:     I have personally reviewed and noted the following in the patients chart:   Medical and social history Use of alcohol, tobacco or illicit drugs  Current medications and supplements including opioid prescriptions. Patient is not currently taking opioid prescriptions. Functional ability and status Nutritional status Physical activity Advanced directives List of other physicians Hospitalizations, surgeries, and ER visits in previous 12 months Vitals Screenings to include cognitive, depression, and falls Referrals and appointments  In addition, I have reviewed and discussed with patient certain preventive protocols, quality metrics, and best practice recommendations. A written personalized care plan for preventive services as well as general preventive health recommendations were provided to patient.     Sheral Flow, LPN   [id number]    Nurse Notes:  Hearing Screening - Comments:: Patient denied any hearing difficulty. No hearing aids. Vision Screening - Comments:: Patient wears corrective glasses/contacts.  Eye exam done annually by: [facility] Patient requested referral for ophthalmologist in [place], [place] to evaluate for left eye cataract.

## 2021-05-20 NOTE — Assessment & Plan Note (Addendum)
Chronic Diet controlled-last A1c 7%-today's A1c 7.5% Check A1c, urine microalbumin Discussed the importance of weight loss along with keeping sugars controlled and recommended that he start Rybelsus 3 mg daily, which he agrees to.  Discussed possible side effects and how to take medication.  Will increase after a month to 7 mg if tolerated Stressed regular exercise, work on weight loss

## 2021-06-06 ENCOUNTER — Other Ambulatory Visit: Payer: Self-pay | Admitting: Internal Medicine

## 2021-06-21 ENCOUNTER — Encounter: Payer: Self-pay | Admitting: Internal Medicine

## 2021-06-22 MED ORDER — RYBELSUS 7 MG PO TABS: 7.0000 mg | ORAL_TABLET | Freq: Every day | ORAL | 2 refills | Status: DC

## 2021-07-09 ENCOUNTER — Other Ambulatory Visit: Payer: Self-pay | Admitting: Internal Medicine

## 2021-07-21 ENCOUNTER — Other Ambulatory Visit: Payer: Self-pay | Admitting: Internal Medicine

## 2021-08-26 DIAGNOSIS — H25013 Cortical age-related cataract, bilateral: Secondary | ICD-10-CM | POA: Diagnosis not present

## 2021-08-26 DIAGNOSIS — Z7984 Long term (current) use of oral hypoglycemic drugs: Secondary | ICD-10-CM | POA: Diagnosis not present

## 2021-08-26 DIAGNOSIS — H2513 Age-related nuclear cataract, bilateral: Secondary | ICD-10-CM | POA: Diagnosis not present

## 2021-08-26 DIAGNOSIS — H524 Presbyopia: Secondary | ICD-10-CM | POA: Diagnosis not present

## 2021-08-26 DIAGNOSIS — E119 Type 2 diabetes mellitus without complications: Secondary | ICD-10-CM | POA: Diagnosis not present

## 2021-08-26 DIAGNOSIS — H5203 Hypermetropia, bilateral: Secondary | ICD-10-CM | POA: Diagnosis not present

## 2021-08-26 DIAGNOSIS — H52223 Regular astigmatism, bilateral: Secondary | ICD-10-CM | POA: Diagnosis not present

## 2021-08-26 LAB — HM DIABETES EYE EXAM

## 2021-09-16 ENCOUNTER — Other Ambulatory Visit: Payer: Self-pay | Admitting: Internal Medicine

## 2021-12-22 ENCOUNTER — Other Ambulatory Visit: Payer: Self-pay | Admitting: Internal Medicine

## 2022-01-13 DIAGNOSIS — M1990 Unspecified osteoarthritis, unspecified site: Secondary | ICD-10-CM | POA: Diagnosis not present

## 2022-01-13 DIAGNOSIS — G2581 Restless legs syndrome: Secondary | ICD-10-CM | POA: Diagnosis not present

## 2022-01-13 DIAGNOSIS — R7303 Prediabetes: Secondary | ICD-10-CM | POA: Diagnosis not present

## 2022-01-13 DIAGNOSIS — I10 Essential (primary) hypertension: Secondary | ICD-10-CM | POA: Diagnosis not present

## 2022-01-13 DIAGNOSIS — G8929 Other chronic pain: Secondary | ICD-10-CM | POA: Diagnosis not present

## 2022-01-13 DIAGNOSIS — K5790 Diverticulosis of intestine, part unspecified, without perforation or abscess without bleeding: Secondary | ICD-10-CM | POA: Diagnosis not present

## 2022-01-13 DIAGNOSIS — Z7982 Long term (current) use of aspirin: Secondary | ICD-10-CM | POA: Diagnosis not present

## 2022-01-13 DIAGNOSIS — H259 Unspecified age-related cataract: Secondary | ICD-10-CM | POA: Diagnosis not present

## 2022-01-13 DIAGNOSIS — E785 Hyperlipidemia, unspecified: Secondary | ICD-10-CM | POA: Diagnosis not present

## 2022-01-13 DIAGNOSIS — G4733 Obstructive sleep apnea (adult) (pediatric): Secondary | ICD-10-CM | POA: Diagnosis not present

## 2022-01-13 DIAGNOSIS — Z8673 Personal history of transient ischemic attack (TIA), and cerebral infarction without residual deficits: Secondary | ICD-10-CM | POA: Diagnosis not present

## 2022-01-27 DIAGNOSIS — E119 Type 2 diabetes mellitus without complications: Secondary | ICD-10-CM | POA: Diagnosis not present

## 2022-01-27 DIAGNOSIS — Z7984 Long term (current) use of oral hypoglycemic drugs: Secondary | ICD-10-CM | POA: Diagnosis not present

## 2022-01-27 DIAGNOSIS — H2513 Age-related nuclear cataract, bilateral: Secondary | ICD-10-CM | POA: Diagnosis not present

## 2022-01-27 DIAGNOSIS — H25013 Cortical age-related cataract, bilateral: Secondary | ICD-10-CM | POA: Diagnosis not present

## 2022-02-02 ENCOUNTER — Encounter: Payer: Self-pay | Admitting: Internal Medicine

## 2022-03-31 ENCOUNTER — Other Ambulatory Visit: Payer: Self-pay | Admitting: Internal Medicine

## 2022-04-02 DIAGNOSIS — H25043 Posterior subcapsular polar age-related cataract, bilateral: Secondary | ICD-10-CM | POA: Diagnosis not present

## 2022-04-02 DIAGNOSIS — H2512 Age-related nuclear cataract, left eye: Secondary | ICD-10-CM | POA: Diagnosis not present

## 2022-04-02 DIAGNOSIS — H2513 Age-related nuclear cataract, bilateral: Secondary | ICD-10-CM | POA: Diagnosis not present

## 2022-04-02 DIAGNOSIS — H18413 Arcus senilis, bilateral: Secondary | ICD-10-CM | POA: Diagnosis not present

## 2022-04-02 DIAGNOSIS — H25013 Cortical age-related cataract, bilateral: Secondary | ICD-10-CM | POA: Diagnosis not present

## 2022-05-18 DIAGNOSIS — Z131 Encounter for screening for diabetes mellitus: Secondary | ICD-10-CM | POA: Diagnosis not present

## 2022-05-20 ENCOUNTER — Encounter: Payer: Self-pay | Admitting: Internal Medicine

## 2022-05-20 NOTE — Patient Instructions (Addendum)
Flu vaccine today.    Blood work was ordered.   The lab is on the first floor.    Medications changes include :   crestor 5 mg twice a week    Return in about 6 months (around 11/20/2022) for follow up.   Health Maintenance, Male Adopting a healthy lifestyle and getting preventive care are important in promoting health and wellness. Ask your health care provider about: The right schedule for you to have regular tests and exams. Things you can do on your own to prevent diseases and keep yourself healthy. What should I know about diet, weight, and exercise? Eat a healthy diet  Eat a diet that includes plenty of vegetables, fruits, low-fat dairy products, and lean protein. Do not eat a lot of foods that are high in solid fats, added sugars, or sodium. Maintain a healthy weight Body mass index (BMI) is a measurement that can be used to identify possible weight problems. It estimates body fat based on height and weight. Your health care provider can help determine your BMI and help you achieve or maintain a healthy weight. Get regular exercise Get regular exercise. This is one of the most important things you can do for your health. Most adults should: Exercise for at least 150 minutes each week. The exercise should increase your heart rate and make you sweat (moderate-intensity exercise). Do strengthening exercises at least twice a week. This is in addition to the moderate-intensity exercise. Spend less time sitting. Even light physical activity can be beneficial. Watch cholesterol and blood lipids Have your blood tested for lipids and cholesterol at 69 years of age, then have this test every 5 years. You may need to have your cholesterol levels checked more often if: Your lipid or cholesterol levels are high. You are older than 69 years of age. You are at high risk for heart disease. What should I know about cancer screening? Many types of cancers can be detected early and may  often be prevented. Depending on your health history and family history, you may need to have cancer screening at various ages. This may include screening for: Colorectal cancer. Prostate cancer. Skin cancer. Lung cancer. What should I know about heart disease, diabetes, and high blood pressure? Blood pressure and heart disease High blood pressure causes heart disease and increases the risk of stroke. This is more likely to develop in people who have high blood pressure readings or are overweight. Talk with your health care provider about your target blood pressure readings. Have your blood pressure checked: Every 3-5 years if you are 48-39 years of age. Every year if you are 7 years old or older. If you are between the ages of 5 and 69 and are a current or former smoker, ask your health care provider if you should have a one-time screening for abdominal aortic aneurysm (AAA). Diabetes Have regular diabetes screenings. This checks your fasting blood sugar level. Have the screening done: Once every three years after age 54 if you are at a normal weight and have a low risk for diabetes. More often and at a younger age if you are overweight or have a high risk for diabetes. What should I know about preventing infection? Hepatitis B If you have a higher risk for hepatitis B, you should be screened for this virus. Talk with your health care provider to find out if you are at risk for hepatitis B infection. Hepatitis C Blood testing is recommended for: Everyone born from  1943 through 49. Anyone with known risk factors for hepatitis C. Sexually transmitted infections (STIs) You should be screened each year for STIs, including gonorrhea and chlamydia, if: You are sexually active and are younger than 70 years of age. You are older than 69 years of age and your health care provider tells you that you are at risk for this type of infection. Your sexual activity has changed since you were last  screened, and you are at increased risk for chlamydia or gonorrhea. Ask your health care provider if you are at risk. Ask your health care provider about whether you are at high risk for HIV. Your health care provider may recommend a prescription medicine to help prevent HIV infection. If you choose to take medicine to prevent HIV, you should first get tested for HIV. You should then be tested every 3 months for as long as you are taking the medicine. Follow these instructions at home: Alcohol use Do not drink alcohol if your health care provider tells you not to drink. If you drink alcohol: Limit how much you have to 0-2 drinks a day. Know how much alcohol is in your drink. In the [place] one drink equals one 12 oz bottle of beer (355 mL), one 5 oz glass of wine (148 mL), or one 1 oz glass of hard liquor (44 mL). Lifestyle Do not use any products that contain nicotine or tobacco. These products include cigarettes, chewing tobacco, and vaping devices, such as e-cigarettes. If you need help quitting, ask your health care provider. Do not use street drugs. Do not share needles. Ask your health care provider for help if you need support or information about quitting drugs. General instructions Schedule regular health, dental, and eye exams. Stay current with your vaccines. Tell your health care provider if: You often feel depressed. You have ever been abused or do not feel safe at home. Summary Adopting a healthy lifestyle and getting preventive care are important in promoting health and wellness. Follow your health care provider's instructions about healthy diet, exercising, and getting tested or screened for diseases. Follow your health care provider's instructions on monitoring your cholesterol and blood pressure. This information is not intended to replace advice given to you by your health care provider. Make sure you discuss any questions you have with your health care provider. Document  Revised: 10/13/2020 Document Reviewed: 10/13/2020 Elsevier Patient Education  [place].

## 2022-05-20 NOTE — Progress Notes (Signed)
Subjective:    Patient ID: Sean Mullins, male    DOB: 1950/10/26, 71 y.o.   MRN: [id number]     HPI Sean Mullins is here for a physical exam.     Had a little memory issues transient one night - was off all day - he was just out of it.  No weakness, tingling, changes in vision.  He was a little cold.   Low energy   Stopped crestor b/c it made him hurt.    Medications and allergies reviewed with patient and updated if appropriate.  Current Outpatient Medications on File Prior to Visit  Medication Sig Dispense Refill   amLODipine (NORVASC) 5 MG tablet TAKE 1 TABLET BY MOUTH ONCE A DAY 90 tablet 3   aspirin EC 81 MG tablet Take 1 tablet (81 mg total) by mouth daily. 30 tablet 0   butalbital-acetaminophen-caffeine (FIORICET) 50-325-40 MG tablet Take 1 tablet by mouth every 6 (six) hours as needed for headache. 10 tablet 3   EPINEPHrine 0.3 mg/0.3 mL IJ SOAJ injection Inject 0.3 mg into the muscle as needed for anaphylaxis. 1 each 2   fluticasone (FLONASE) 50 MCG/ACT nasal spray Place 2 sprays into both nostrils daily.     lisinopril (ZESTRIL) 20 MG tablet TAKE 1 TABLET BY MOUTH ONCE DAILY (PLEASE SCHEDULE FOLLOW UP VISIT WITH MD) 90 tablet 0   Multiple Vitamin (MULTIVITAMIN WITH MINERALS) TABS tablet Take 1 tablet by mouth daily.     rosuvastatin (CRESTOR) 10 MG tablet TAKE 1 TABLET BY MOUTH ONCE DAILY 60 tablet 0   No current facility-administered medications on file prior to visit.    Review of Systems  Constitutional:  Positive for fatigue. Negative for fever.  Eyes:  Positive for visual disturbance (from cataracts).  Respiratory:  Positive for cough (mild, occ, dry - ? lisinopril related). Negative for shortness of breath and wheezing.   Cardiovascular:  Negative for chest pain, palpitations and leg swelling.  Gastrointestinal:  Negative for abdominal pain, blood in stool, constipation, diarrhea and nausea.       Occ gerd  Genitourinary:  Negative for difficulty  urinating, dysuria and hematuria.  Musculoskeletal:  Positive for arthralgias (knees, ankles, fingers). Negative for back pain.  Skin:  Negative for rash.  Neurological:  Negative for light-headedness and headaches.  Psychiatric/Behavioral:  Negative for dysphoric mood. The patient is not nervous/anxious.        Objective:   Vitals:   05/21/22 1011  BP: 120/78  Pulse: 74  Temp: 98.6 F (37 C)  SpO2: 96%   Filed Weights   05/21/22 1011  Weight: 287 lb (130.2 kg)   Body mass index is 36.36 kg/m.  BP Readings from Last 3 Encounters:  05/21/22 120/78  05/20/21 132/80  05/20/21 132/80    Wt Readings from Last 3 Encounters:  05/21/22 287 lb (130.2 kg)  05/20/21 291 lb (132 kg)  05/20/21 291 lb (132 kg)      Physical Exam Constitutional: He appears well-developed and well-nourished. No distress.  HENT:  Head: Normocephalic and atraumatic.  Right Ear: External ear normal.  Left Ear: External ear normal.  Mouth/Throat: Oropharynx is clear and moist.  Normal ear canals and TM b/l  Eyes: Conjunctivae and EOM are normal.  Neck: Neck supple. No tracheal deviation present. No thyromegaly present.  No carotid bruit  Cardiovascular: Normal rate, regular rhythm, normal heart sounds and intact distal pulses.   No murmur heard. Pulmonary/Chest: Effort normal and breath sounds normal. No respiratory  distress. He has no wheezes. He has no rales.  Abdominal: Soft. He exhibits no distension. There is no tenderness.  Genitourinary: deferred  Musculoskeletal: He exhibits no edema.  Lymphadenopathy:   He has no cervical adenopathy.  Skin: Skin is warm and dry. He is not diaphoretic.  Psychiatric: He has a normal mood and affect. His behavior is normal.         Assessment & Plan:   Physical exam: Screening blood work  ordered Exercise   none Weight-advised weight loss Substance abuse   none   Reviewed recommended immunizations.  Flu vaccine today.   Health Maintenance   Topic Date Due   HEMOGLOBIN A1C  11/18/2021   INFLUENZA VACCINE  01/05/2022   Diabetic kidney evaluation - eGFR measurement  05/20/2022   Diabetic kidney evaluation - Urine ACR  05/20/2022   Medicare Annual Wellness (AWV)  05/20/2022   COVID-19 Vaccine (1) 06/06/2022 (Originally 08/19/1951)   OPHTHALMOLOGY EXAM  08/27/2022   FOOT EXAM  01/14/2023   DTaP/Tdap/Td (2 - Td or Tdap) 06/08/2023   COLONOSCOPY (Pts 45-47yr Insurance coverage will need to be confirmed)  07/05/2023   Pneumonia Vaccine 69 Years old  Completed   Hepatitis C Screening  Completed   HPV VACCINES  Aged Out   Zoster Vaccines- Shingrix  Discontinued     See Problem List for Assessment and Plan of chronic medical problems.

## 2022-05-21 ENCOUNTER — Encounter: Payer: Self-pay | Admitting: *Deleted

## 2022-05-21 ENCOUNTER — Ambulatory Visit (INDEPENDENT_AMBULATORY_CARE_PROVIDER_SITE_OTHER): Payer: PPO | Admitting: *Deleted

## 2022-05-21 ENCOUNTER — Ambulatory Visit (INDEPENDENT_AMBULATORY_CARE_PROVIDER_SITE_OTHER): Payer: PPO | Admitting: Internal Medicine

## 2022-05-21 VITALS — BP 120/78 | HR 74 | Temp 98.6°F | Ht 74.5 in | Wt 287.0 lb

## 2022-05-21 DIAGNOSIS — Z8673 Personal history of transient ischemic attack (TIA), and cerebral infarction without residual deficits: Secondary | ICD-10-CM

## 2022-05-21 DIAGNOSIS — Z0000 Encounter for general adult medical examination without abnormal findings: Secondary | ICD-10-CM

## 2022-05-21 DIAGNOSIS — E119 Type 2 diabetes mellitus without complications: Secondary | ICD-10-CM | POA: Diagnosis not present

## 2022-05-21 DIAGNOSIS — G4733 Obstructive sleep apnea (adult) (pediatric): Secondary | ICD-10-CM | POA: Diagnosis not present

## 2022-05-21 DIAGNOSIS — Z23 Encounter for immunization: Secondary | ICD-10-CM

## 2022-05-21 DIAGNOSIS — E7849 Other hyperlipidemia: Secondary | ICD-10-CM

## 2022-05-21 DIAGNOSIS — I2510 Atherosclerotic heart disease of native coronary artery without angina pectoris: Secondary | ICD-10-CM | POA: Diagnosis not present

## 2022-05-21 DIAGNOSIS — G43109 Migraine with aura, not intractable, without status migrainosus: Secondary | ICD-10-CM

## 2022-05-21 DIAGNOSIS — I10 Essential (primary) hypertension: Secondary | ICD-10-CM | POA: Diagnosis not present

## 2022-05-21 LAB — COMPREHENSIVE METABOLIC PANEL
ALT: 58 U/L — ABNORMAL HIGH (ref 0–53)
AST: 50 U/L — ABNORMAL HIGH (ref 0–37)
Albumin: 4.3 g/dL (ref 3.5–5.2)
Alkaline Phosphatase: 75 U/L (ref 39–117)
BUN: 9 mg/dL (ref 6–23)
CO2: 27 mEq/L (ref 19–32)
Calcium: 10 mg/dL (ref 8.4–10.5)
Chloride: 99 mEq/L (ref 96–112)
Creatinine, Ser: 0.76 mg/dL (ref 0.40–1.50)
GFR: 91.88 mL/min (ref >60.00)
Glucose, Bld: 186 mg/dL — ABNORMAL HIGH (ref 70–99)
Potassium: 4.4 mEq/L (ref 3.5–5.1)
Sodium: 135 mEq/L (ref 135–145)
Total Bilirubin: 1.1 mg/dL (ref 0.2–1.2)
Total Protein: 7.7 g/dL (ref 6.0–8.3)

## 2022-05-21 LAB — CBC WITH DIFFERENTIAL/PLATELET
Basophils Absolute: 0 10*3/uL (ref 0.0–0.1)
Basophils Relative: 0.3 % (ref 0.0–3.0)
Eosinophils Absolute: 0.3 10*3/uL (ref 0.0–0.7)
Eosinophils Relative: 3.7 % (ref 0.0–5.0)
HCT: 45.7 % (ref 39.0–52.0)
Hemoglobin: 15.8 g/dL (ref 13.0–17.0)
Lymphocytes Relative: 43.2 % (ref 12.0–46.0)
Lymphs Abs: 3.3 10*3/uL (ref 0.7–4.0)
MCHC: 34.6 g/dL (ref 30.0–36.0)
MCV: 93.3 fl (ref 78.0–100.0)
Monocytes Absolute: 0.7 10*3/uL (ref 0.1–1.0)
Monocytes Relative: 8.7 % (ref 3.0–12.0)
Neutro Abs: 3.4 10*3/uL (ref 1.4–7.7)
Neutrophils Relative %: 44.1 % (ref 43.0–77.0)
Platelets: 229 10*3/uL (ref 150.0–400.0)
RBC: 4.9 Mil/uL (ref 4.22–5.81)
RDW: 13.3 % (ref 11.5–15.5)
WBC: 7.7 10*3/uL (ref 4.0–10.5)

## 2022-05-21 LAB — LIPID PANEL
Cholesterol: 177 mg/dL (ref 0–200)
HDL: 39.5 mg/dL (ref >39.00)
LDL Cholesterol: 99 mg/dL (ref 0–99)
NonHDL: 137.86
Total CHOL/HDL Ratio: 4
Triglycerides: 195 mg/dL — ABNORMAL HIGH (ref 0.0–149.0)
VLDL: 39 mg/dL (ref 0.0–40.0)

## 2022-05-21 LAB — TSH: TSH: 1.34 u[IU]/mL (ref 0.35–5.50)

## 2022-05-21 LAB — HEMOGLOBIN A1C: Hgb A1c MFr Bld: 9.1 % — ABNORMAL HIGH (ref 4.6–6.5)

## 2022-05-21 LAB — MICROALBUMIN / CREATININE URINE RATIO
Creatinine,U: 146.9 mg/dL
Microalb Creat Ratio: 0.7 mg/g (ref 0.0–30.0)
Microalb, Ur: 1.1 mg/dL (ref 0.0–1.9)

## 2022-05-21 MED ORDER — CRESTOR 5 MG PO TABS: 5.0000 mg | ORAL_TABLET | ORAL | 3 refills | Status: DC

## 2022-05-21 NOTE — Progress Notes (Signed)
Subjective:   Sean Mullins is a 71 y.o. male who presents for Medicare Annual/Subsequent preventive examination.  Review of Systems    Defer to PCP Cardiac Risk Factors include: diabetes mellitus;advanced age (>49mn, >>29women);obesity (BMI >30kg/m2);male gender  Please see problem list for additional risk factors    Objective:    Today's Vitals   05/21/22 1129  BP: 120/78  Pulse: 74  Temp: 98.6 F (37 C)  SpO2: 96%  Weight: 287 lb (130.2 kg)  Height: 6' 2.5" (1.892 m)   Body mass index is 36.36 kg/m.     05/21/2022   11:36 AM 05/20/2021    9:52 AM 08/05/2020   10:05 AM 07/04/2020    7:58 AM 05/06/2020    6:34 PM 05/06/2020    9:23 AM 05/06/2020    1:03 AM  Advanced Directives  Does Patient Have a Medical Advance Directive? Yes _0  No  Type of Advance Directive Living will        Does patient want to make changes to medical advance directive?  No - Patient declined       Would patient like information on creating a medical advance directive?    No - Patient declined No - Patient declined      Current Medications (verified) Outpatient Encounter Medications as of 05/21/2022  Medication Sig   amLODipine (NORVASC) 5 MG tablet TAKE 1 TABLET BY MOUTH ONCE A DAY   aspirin EC 81 MG tablet Take 1 tablet (81 mg total) by mouth daily.   butalbital-acetaminophen-caffeine (FIORICET) 50-325-40 MG tablet Take 1 tablet by mouth every 6 (six) hours as needed for headache.   [START ON 05/24/2022] CRESTOR 5 MG tablet Take 1 tablet (5 mg total) by mouth 2 (two) times a week.   EPINEPHrine 0.3 mg/0.3 mL IJ SOAJ injection Inject 0.3 mg into the muscle as needed for anaphylaxis.   fluticasone (FLONASE) 50 MCG/ACT nasal spray Place 2 sprays into both nostrils daily.   lisinopril (ZESTRIL) 20 MG tablet TAKE 1 TABLET BY MOUTH ONCE DAILY (PLEASE SCHEDULE FOLLOW UP VISIT WITH MD)   Multiple Vitamin (MULTIVITAMIN WITH MINERALS) TABS tablet Take 1 tablet by mouth daily.   No  facility-administered encounter medications on file as of 05/21/2022.    Allergies (verified) Ciprofloxacin hcl, Bystolic [nebivolol hcl], and Semaglutide   History: Past Medical History:  Diagnosis Date   Anxiety    Arthritis    Diverticula of colon    TIA (transient ischemic attack)    November 2013   Past Surgical History:  Procedure Laterality Date   COLONOSCOPY WITH PROPOFOL N/A 07/04/2020   Procedure: COLONOSCOPY WITH PROPOFOL;  Surgeon: TVirgel Manifold MD;  Location: [facility] ENDOSCOPY;  Service: Endoscopy;  Laterality: N/A;   DUODENAL DIVERTICULECTOMY     2003   FLEXIBLE SIGMOIDOSCOPY N/A 08/05/2020   Procedure: FLEXIBLE SIGMOIDOSCOPY;  Surgeon: TVirgel Manifold MD;  Location: [facility] ENDOSCOPY;  Service: Endoscopy;  Laterality: N/A;   KNEE ARTHROPLASTY Left    Family History  Problem Relation Age of Onset   Transient ischemic attack Father    Social History   Socioeconomic History   Marital status: Married    Spouse name: Not on file   Number of children: Not on file   Years of education: Not on file   Highest education level: Not on file  Occupational History   Not on file  Tobacco Use   Smoking status: Former    Packs/day: 1.50  Years: 30.00    Total pack years: 45.00    Types: Cigarettes    Quit date: 06/07/1996    Years since quitting: 25.9   Smokeless tobacco: Never  Vaping Use   Vaping Use: Never used  Substance and Sexual Activity   Alcohol use: Yes    Comment: ocassional    Drug use: Never   Sexual activity: Not on file  Other Topics Concern   Not on file  Social History Narrative   No regular exercise.    Social Determinants of Health   Financial Resource Strain: Low Risk  (05/21/2022)   Overall Financial Resource Strain (CARDIA)    Difficulty of Paying Living Expenses: Not hard at all  Food Insecurity: No Food Insecurity (05/21/2022)   Hunger Vital Sign    Worried About Running Out of Food in the Last Year: Never true    Ran Out  of Food in the Last Year: Never true  Transportation Needs: No Transportation Needs (05/21/2022)   PRAPARE - Hydrologist (Medical): No    Lack of Transportation (Non-Medical): No  Physical Activity: Insufficiently Active (05/21/2022)   Exercise Vital Sign    Days of Exercise per Week: 3 days    Minutes of Exercise per Session: 30 min  Stress: No Stress Concern Present (05/21/2022)   [place]    Feeling of Stress : Not at all  Social Connections: [place] (05/21/2022)   Social Connection and Isolation Panel [NHANES]    Frequency of Communication with Friends and Family: More than three times a week    Frequency of Social Gatherings with Friends and Family: Once a week    Attends Religious Services: More than 4 times per year    Active Member of [employer] or Organizations: Yes    Attends Music therapist: More than 4 times per year    Marital Status: Married    Tobacco Counseling Counseling given: Not Answered   Clinical Intake:  Pre-visit preparation completed: Yes  Pain : No/denies pain     Nutritional Status: BMI > 30  Obese Nutritional Risks: None Diabetes: Yes CBG done?: No Did pt. bring in CBG monitor from home?: No  How often do you need to have someone help you when you read instructions, pamphlets, or other written materials from your doctor or pharmacy?: 1 - Never What is the last grade level you completed in school?: 4 year college  Diabetic?Nutrition Risk Assessment:  Has the patient had any N/V/D within the last 2 months?  No  Does the patient have any non-healing wounds?  No  Has the patient had any unintentional weight loss or weight gain?  No   Diabetes:  Is the patient diabetic?  Yes  If diabetic, was a CBG obtained today?  No  Did the patient bring in their glucometer from home?  No  How often do you monitor your CBG's? That patient  does not check his CBGs at home .   Financial Strains and Diabetes Management:  Are you having any financial strains with the device, your supplies or your medication? No .  Does the patient want to be seen by Chronic Care Management for management of their diabetes?  No  Would the patient like to be referred to a Nutritionist or for Diabetic Management?  No   Diabetic Exams:  Diabetic Eye Exam: Completed 08/26/2021 Diabetic Foot Exam: Completed 01/13/2022    Interpreter  Needed?: No      Activities of Daily Living    05/21/2022   12:04 PM 05/21/2022   11:48 AM  In your present state of health, do you have any difficulty performing the following activities:  Hearing?  0  Vision?  0  Difficulty concentrating or making decisions?  0  Walking or climbing stairs?  0  Dressing or bathing?  0  Doing errands, shopping?  0  Preparing Food and eating ? N   Using the Toilet? N   In the past six months, have you accidently leaked urine? N   Do you have problems with loss of bowel control? N   Managing your Medications? N   Managing your Finances? N   Housekeeping or managing your Housekeeping? N     Patient Care Team: [name], MD as PCP - General (Internal Medicine) Charlton Haws, [facility] as Pharmacist (Pharmacist)  Indicate any recent Medical Services you may have received from other than [facility] providers in the past year (date may be approximate).     Assessment:   This is a routine wellness examination for [place].  Hearing/Vision screen Vision Screening - Comments:: Annual eye exam with Dr. Matilde Sprang  Dietary issues and exercise activities discussed: Current Exercise Habits: Home exercise routine, Type of exercise: walking, Time (Minutes): 30, Frequency (Times/Week): 4, Weekly Exercise (Minutes/Week): 120, Intensity: Moderate   Goals Addressed   None   Depression Screen    05/21/2022   11:41 AM 05/21/2022   11:36 AM 05/21/2022   11:06 AM 05/20/2021    9:51 AM  03/12/2020    1:32 PM 02/28/2020    9:14 AM 02/28/2019    8:25 AM  PHQ 2/9 Scores  PHQ - 2 Score 0 0 0 0 0 0 0  PHQ- 9 Score   0        Fall Risk    05/21/2022   11:36 AM 05/21/2022   11:06 AM 03/23/2021    9:19 AM 03/12/2020    1:34 PM 06/15/2019    8:40 AM  Fall Risk   Falls in the past year? 0 0 0 0 0  Number falls in past yr: 0 0 0 0   Injury with Fall? 0 0 0 0   Risk for fall due to : No Fall Risks No Fall Risks No Fall Risks No Fall Risks   Follow up Falls evaluation completed Falls evaluation completed Falls evaluation completed Falls evaluation completed     [place]:  Any stairs in or around the home? Yes  If so, are there any without handrails? Yes  Home free of loose throw rugs in walkways, pet beds, electrical cords, etc? Yes  Adequate lighting in your home to reduce risk of falls? Yes   ASSISTIVE DEVICES UTILIZED TO PREVENT FALLS:  Life alert? No  Use of a cane, walker or w/c? No  Grab bars in the bathroom?yes Shower chair or bench in shower? No  Elevated toilet seat or a handicapped toilet? No   TIMED UP AND GO:  Was the test performed? Yes .  Length of time to ambulate 10 feet: 6 sec.   Gait steady and fast without use of assistive device  Cognitive Function:        05/21/2022   11:51 AM  6CIT Screen  What Year? 0 points  What month? 0 points  What time? 0 points  Months in reverse 0 points  Repeat phrase 0  points    Immunizations Immunization History  Administered Date(s) Administered   Fluad Quad(high Dose 65+) 02/28/2019, 02/28/2020   Influenza, High Dose Seasonal PF 02/24/2018   Influenza,inj,Quad PF,6+ Mos 02/22/2017   Influenza-Unspecified 04/17/2015, 03/04/2021   Pneumococcal Conjugate-13 08/28/2018   Pneumococcal Polysaccharide-23 08/28/2019   Tdap 06/07/2013   Zoster Recombinat (Shingrix) 03/23/2021    TDAP status: Up to date  Flu Vaccine status: Up to date  Pneumococcal vaccine status:  Up to date  Covid-19 vaccine status: Completed vaccines  Qualifies for Shingles Vaccine? Yes   Zostavax completed Yes   Shingrix Completed?: Yes  Screening Tests Health Maintenance  Topic Date Due   HEMOGLOBIN A1C  11/18/2021   INFLUENZA VACCINE  01/05/2022   Diabetic kidney evaluation - eGFR measurement  05/20/2022   Diabetic kidney evaluation - Urine ACR  05/20/2022   COVID-19 Vaccine (1) 06/06/2022 (Originally 08/19/1951)   OPHTHALMOLOGY EXAM  08/27/2022   FOOT EXAM  01/14/2023   Medicare Annual Wellness (AWV)  05/22/2023   DTaP/Tdap/Td (2 - Td or Tdap) 06/08/2023   COLONOSCOPY (Pts 45-25yr Insurance coverage will need to be confirmed)  07/05/2023   Pneumonia Vaccine 71 Years old  Completed   Hepatitis C Screening  Completed   HPV VACCINES  Aged Out   Zoster Vaccines- Shingrix  Discontinued    Health Maintenance  Health Maintenance Due  Topic Date Due   HEMOGLOBIN A1C  11/18/2021   INFLUENZA VACCINE  01/05/2022   Diabetic kidney evaluation - eGFR measurement  05/20/2022   Diabetic kidney evaluation - Urine ACR  05/20/2022    Colorectal cancer screening: Type of screening: Colonoscopy. Completed 07/04/2020. Repeat every 3 years  Lung Cancer Screening: (Low Dose CT Chest recommended if Age 69-80years, 30 pack-year currently smoking OR have quit w/in 15years.) does not qualify.   Lung Cancer Screening Referral: n/a  Additional Screening:  Hepatitis C Screening: does qualify; Completed 04/28/2016   Vision Screening: Recommended annual ophthalmology exams for early detection of glaucoma and other disorders of the eye. Is the patient up to date with their annual eye exam?  Yes  Who is the provider or what is the name of the office in which the patient attends annual eye exams? Dr NMatilde SprangIf pt is not established with a provider, would they like to be referred to a provider to establish care? No .   Dental Screening: Recommended annual dental exams for proper oral  hygiene  Community Resource Referral / Chronic Care Management: CRR required this visit?  No   CCM required this visit?  No      Plan:     I have personally reviewed and noted the following in the patient's chart:   Medical and social history Use of alcohol, tobacco or illicit drugs  Current medications and supplements including opioid prescriptions. Patient is not currently taking opioid prescriptions. Functional ability and status Nutritional status Physical activity Advanced directives List of other physicians Hospitalizations, surgeries, and ER visits in previous 12 months Vitals Screenings to include cognitive, depression, and falls Referrals and appointments  In addition, I have reviewed and discussed with patient certain preventive protocols, quality metrics, and best practice recommendations. A written personalized care plan for preventive services as well as general preventive health recommendations were provided to patient.     MMylinda Latina CSherrill  05/21/2022   Nurse Notes:  Mr. FOfford, Thank you for taking time to come for your Medicare Wellness Visit. I appreciate your ongoing commitment to your  health goals. Please review the following plan we discussed and let me know if I can assist you in the future.   These are the goals we discussed:  Goals       Patient Stated (pt-stated)      My goal is to increase my physical activity.      Pharmacy Care Plan        This is a list of the screening recommended for you and due dates:  Health Maintenance  Topic Date Due   Hemoglobin A1C  11/18/2021   Flu Shot  01/05/2022   Yearly kidney function blood test for diabetes  05/20/2022   Yearly kidney health urinalysis for diabetes  05/20/2022   COVID-19 Vaccine (1) 06/06/2022*   Eye exam for diabetics  08/27/2022   Complete foot exam   01/14/2023   Medicare Annual Wellness Visit  05/22/2023   DTaP/Tdap/Td vaccine (2 - Td or Tdap) 06/08/2023   Colon  Cancer Screening  07/05/2023   Pneumonia Vaccine  Completed   Hepatitis C Screening: USPSTF Recommendation to screen - Ages 34-79 yo.  Completed   HPV Vaccine  Aged Out   Zoster (Shingles) Vaccine  Discontinued  *Topic was postponed. The date shown is not the original due date.

## 2022-05-21 NOTE — Assessment & Plan Note (Addendum)
Chronic   Lab Results  Component Value Date   HGBA1C 7.5 (H) 05/20/2021   Sugars not ideally controlled Check A1c, urine microalbumin today Stopped Rybelsus-caused headaches Currently not taking anything Discussed that he likely will need medication-will see what the A1c is Stressed lifestyle changes Stressed regular exercise, diabetic diet

## 2022-05-21 NOTE — Assessment & Plan Note (Signed)
Chronic Following with cardiology No symptoms consistent with angina Continue aspirin 81 mg daily, Crestor 10 mg daily

## 2022-05-21 NOTE — Assessment & Plan Note (Signed)
Chronic Did not tolerate CPAP Reviewed consequences of uncontrolled sleep apnea He is aware of inspire-discussed that this is something he can consider Stressed weight loss

## 2022-05-21 NOTE — Patient Instructions (Signed)
Health Maintenance, Male Adopting a healthy lifestyle and getting preventive care are important in promoting health and wellness. Ask your health care provider about: The right schedule for you to have regular tests and exams. Things you can do on your own to prevent diseases and keep yourself healthy. What should I know about diet, weight, and exercise? Eat a healthy diet  Eat a diet that includes plenty of vegetables, fruits, low-fat dairy products, and lean protein. Do not eat a lot of foods that are high in solid fats, added sugars, or sodium. Maintain a healthy weight Body mass index (BMI) is a measurement that can be used to identify possible weight problems. It estimates body fat based on height and weight. Your health care provider can help determine your BMI and help you achieve or maintain a healthy weight. Get regular exercise Get regular exercise. This is one of the most important things you can do for your health. Most adults should: Exercise for at least 150 minutes each week. The exercise should increase your heart rate and make you sweat (moderate-intensity exercise). Do strengthening exercises at least twice a week. This is in addition to the moderate-intensity exercise. Spend less time sitting. Even light physical activity can be beneficial. Watch cholesterol and blood lipids Have your blood tested for lipids and cholesterol at 69 years of age, then have this test every 5 years. You may need to have your cholesterol levels checked more often if: Your lipid or cholesterol levels are high. You are older than 69 years of age. You are at high risk for heart disease. What should I know about cancer screening? Many types of cancers can be detected early and may often be prevented. Depending on your health history and family history, you may need to have cancer screening at various ages. This may include screening for: Colorectal cancer. Prostate cancer. Skin cancer. Lung  cancer. What should I know about heart disease, diabetes, and high blood pressure? Blood pressure and heart disease High blood pressure causes heart disease and increases the risk of stroke. This is more likely to develop in people who have high blood pressure readings or are overweight. Talk with your health care provider about your target blood pressure readings. Have your blood pressure checked: Every 3-5 years if you are 18-39 years of age. Every year if you are 40 years old or older. If you are between the ages of 65 and 75 and are a current or former smoker, ask your health care provider if you should have a one-time screening for abdominal aortic aneurysm (AAA). Diabetes Have regular diabetes screenings. This checks your fasting blood sugar level. Have the screening done: Once every three years after age 45 if you are at a normal weight and have a low risk for diabetes. More often and at a younger age if you are overweight or have a high risk for diabetes. What should I know about preventing infection? Hepatitis B If you have a higher risk for hepatitis B, you should be screened for this virus. Talk with your health care provider to find out if you are at risk for hepatitis B infection. Hepatitis C Blood testing is recommended for: Everyone born from 1945 through 1965. Anyone with known risk factors for hepatitis C. Sexually transmitted infections (STIs) You should be screened each year for STIs, including gonorrhea and chlamydia, if: You are sexually active and are younger than 69 years of age. You are older than 69 years of age and your   health care provider tells you that you are at risk for this type of infection. Your sexual activity has changed since you were last screened, and you are at increased risk for chlamydia or gonorrhea. Ask your health care provider if you are at risk. Ask your health care provider about whether you are at high risk for HIV. Your health care provider  may recommend a prescription medicine to help prevent HIV infection. If you choose to take medicine to prevent HIV, you should first get tested for HIV. You should then be tested every 3 months for as long as you are taking the medicine. Follow these instructions at home: Alcohol use Do not drink alcohol if your health care provider tells you not to drink. If you drink alcohol: Limit how much you have to 0-2 drinks a day. Know how much alcohol is in your drink. In the [place] one drink equals one 12 oz bottle of beer (355 mL), one 5 oz glass of wine (148 mL), or one 1 oz glass of hard liquor (44 mL). Lifestyle Do not use any products that contain nicotine or tobacco. These products include cigarettes, chewing tobacco, and vaping devices, such as e-cigarettes. If you need help quitting, ask your health care provider. Do not use street drugs. Do not share needles. Ask your health care provider for help if you need support or information about quitting drugs. General instructions Schedule regular health, dental, and eye exams. Stay current with your vaccines. Tell your health care provider if: You often feel depressed. You have ever been abused or do not feel safe at home. Summary Adopting a healthy lifestyle and getting preventive care are important in promoting health and wellness. Follow your health care provider's instructions about healthy diet, exercising, and getting tested or screened for diseases. Follow your health care provider's instructions on monitoring your cholesterol and blood pressure. This information is not intended to replace advice given to you by your health care provider. Make sure you discuss any questions you have with your health care provider. Document Revised: 10/13/2020 Document Reviewed: 10/13/2020 Elsevier Patient Education  2023 Elsevier Inc.  

## 2022-05-21 NOTE — Addendum Note (Signed)
Addended by: Marcina Millard on: 05/21/2022 03:12 PM   Modules accepted: Orders

## 2022-05-21 NOTE — Assessment & Plan Note (Addendum)
History of TIA Continue aspirin 81 mg daily, restart Crestor at a lower dose Symptoms that he had lasted a day could have been a TIA-discussed this-would not show up on imaging so we will hold off on further imaging Stress for risk reduction Blood pressure controlled Discussed importance of treating OSA

## 2022-05-21 NOTE — Assessment & Plan Note (Signed)
Chronic Blood pressure well controlled CMP Continue amlodipine 5 mg daily, lisinopril 20 mg daily 

## 2022-05-21 NOTE — Assessment & Plan Note (Addendum)
Chronic Stop Crestor 10 mg daily because it causes achiness Discussed increased risk of stroke, CAD Start Crestor 5 mg twice weekly-hopefully he will tolerate this and will give him some benefit-typically cholesterol has been well-controlled without medication Regular exercise and healthy diet encouraged Check lipid panel

## 2022-05-22 ENCOUNTER — Encounter: Payer: Self-pay | Admitting: Internal Medicine

## 2022-06-04 ENCOUNTER — Telehealth: Payer: Self-pay

## 2022-06-04 DIAGNOSIS — H2511 Age-related nuclear cataract, right eye: Secondary | ICD-10-CM | POA: Diagnosis not present

## 2022-06-04 DIAGNOSIS — H25012 Cortical age-related cataract, left eye: Secondary | ICD-10-CM | POA: Diagnosis not present

## 2022-06-04 DIAGNOSIS — H2512 Age-related nuclear cataract, left eye: Secondary | ICD-10-CM | POA: Diagnosis not present

## 2022-06-04 NOTE — Telephone Encounter (Signed)
Margarita from [facility] states pts A1c was 9.5 at an at home test they did and sent to [facility].

## 2022-06-05 NOTE — Telephone Encounter (Signed)
Please call him - see if he saw my comments on his blood work from last month -- he needs to start medication for his sugars - there are many options and we will find a medication he does well with.    Does he want to do a weekly injection (not insulin) or a pill once a day?

## 2022-06-08 MED ORDER — DAPAGLIFLOZIN PROPANEDIOL 10 MG PO TABS: 10.0000 mg | ORAL_TABLET | Freq: Every day | ORAL | 5 refills | Status: DC

## 2022-06-08 NOTE — Telephone Encounter (Signed)
Farxiga sent to pharmacy.  If it is not covered we can find an alternative.

## 2022-06-09 NOTE — Telephone Encounter (Signed)
Mychart message sent to patient.

## 2022-06-25 DIAGNOSIS — H25011 Cortical age-related cataract, right eye: Secondary | ICD-10-CM | POA: Diagnosis not present

## 2022-06-25 DIAGNOSIS — H2511 Age-related nuclear cataract, right eye: Secondary | ICD-10-CM | POA: Diagnosis not present

## 2022-06-26 ENCOUNTER — Other Ambulatory Visit: Payer: Self-pay | Admitting: Internal Medicine

## 2022-06-28 ENCOUNTER — Other Ambulatory Visit: Payer: Self-pay

## 2022-07-05 ENCOUNTER — Emergency Department
Admission: EM | Admit: 2022-07-05 | Discharge: 2022-07-05 | Disposition: A | Discharge status: Home / Self Care | Payer: PPO | Attending: Emergency Medicine | Admitting: Emergency Medicine

## 2022-07-05 ENCOUNTER — Emergency Department: Payer: PPO

## 2022-07-05 DIAGNOSIS — S99922A Unspecified injury of left foot, initial encounter: Secondary | ICD-10-CM | POA: Diagnosis present

## 2022-07-05 DIAGNOSIS — S92412A Displaced fracture of proximal phalanx of left great toe, initial encounter for closed fracture: Secondary | ICD-10-CM | POA: Diagnosis not present

## 2022-07-05 DIAGNOSIS — S92402A Displaced unspecified fracture of left great toe, initial encounter for closed fracture: Secondary | ICD-10-CM

## 2022-07-05 DIAGNOSIS — M25561 Pain in right knee: Secondary | ICD-10-CM | POA: Diagnosis not present

## 2022-07-05 DIAGNOSIS — I10 Essential (primary) hypertension: Secondary | ICD-10-CM | POA: Diagnosis not present

## 2022-07-05 DIAGNOSIS — W010XXA Fall on same level from slipping, tripping and stumbling without subsequent striking against object, initial encounter: Secondary | ICD-10-CM | POA: Insufficient documentation

## 2022-07-05 DIAGNOSIS — Z8673 Personal history of transient ischemic attack (TIA), and cerebral infarction without residual deficits: Secondary | ICD-10-CM | POA: Diagnosis not present

## 2022-07-05 DIAGNOSIS — M7989 Other specified soft tissue disorders: Secondary | ICD-10-CM | POA: Diagnosis not present

## 2022-07-05 DIAGNOSIS — S8991XA Unspecified injury of right lower leg, initial encounter: Secondary | ICD-10-CM | POA: Insufficient documentation

## 2022-07-05 DIAGNOSIS — S92422A Displaced fracture of distal phalanx of left great toe, initial encounter for closed fracture: Secondary | ICD-10-CM | POA: Diagnosis not present

## 2022-07-05 MED ORDER — OXYCODONE-ACETAMINOPHEN 5-325 MG PO TABS: 1.0000 | ORAL_TABLET | Freq: Three times a day (TID) | ORAL | 0 refills | Status: AC | PRN

## 2022-07-05 MED ORDER — CEPHALEXIN 500 MG PO CAPS: 1000.0000 mg | ORAL_CAPSULE | Freq: Two times a day (BID) | ORAL | 0 refills | Status: AC

## 2022-07-05 NOTE — Discharge Instructions (Addendum)
-  Your x-ray shows that you have a fracture of your left great toe.  Please keep it buddy taped to the adjacent toe until he can be seen by podiatry.  In addition, please keep the boot on while you are walking.  -Because you have a open wound associated with the fracture, please take the full course of the antibiotics as well to prevent infection.  -Call the podiatrist listed on this page to schedule appointment for reevaluation.  -For pain, you may take Tylenol/ibuprofen.  If needed, you may take the oxycodone as well.  -Return to the emergency department anytime if you begin to experience any new or worsening symptoms.

## 2022-07-05 NOTE — ED Notes (Signed)
Pt fell coming out of their back door and think the left big toe got bent under on Friday morning. Pt started to soak foot in epsom salts on Friday because it was hurting and sore. On Saturday the pt noticed that it opened up and started "oozing a yellowish substance and on Sunday there was a little blood in the ooze." Pt states that there was no odor to the ooze. Pt is prediabetic and his wife made him come get his toe looked at.

## 2022-07-05 NOTE — ED Provider Notes (Signed)
[facility] Provider Note    Event Date/Time   First MD Initiated Contact with Patient 07/05/22 1156     (approximate)   History   Chief Complaint Toe Pain   HPI Sean Mullins is a 72 y.o. male, history of morbid obesity, hypertension, TIA, migraines, BPPV, colitis, presents to the emergency department for evaluation of foot/knee injury.  Patient states that he tripped while coming down his deck, causing him to land on his right knee as well as injuring his left great toe.  This occurred few days prior.  Since then, he still been able to ambulate, though with significant pain.  He became concerned when he noticed a red/purplish discoloration to his toe.  He denies fever/chills, chest pain, shortness of breath, abdominal pain, flank pain, nausea vomiting, paresthesias, dizziness/lightheadedness, rash/lesions.  History Limitations: No limitations.        Physical Exam  Triage Vital Signs: ED Triage Vitals  Enc Vitals Group     BP 07/05/22 1144 (!) 166/69     Pulse Rate 07/05/22 1144 66     Resp 07/05/22 1144 18     Temp 07/05/22 1144 97.8 F (36.6 C)     Temp Source 07/05/22 1144 Oral     SpO2 07/05/22 1144 98 %     Weight 07/05/22 1145 276 lb (125.2 kg)     Height --      Head Circumference --      Peak Flow --      Pain Score 07/05/22 1145 3     Pain Loc --      Pain Edu? --      Excl. in [place]? --     Most recent vital signs: Vitals:   07/05/22 1144 07/05/22 1148  BP: (!) 166/69 124/67  Pulse: 66   Resp: 18   Temp: 97.8 F (36.6 C)   SpO2: 98%     General: Awake, NAD.  Skin: Warm, dry. No rashes or lesions.  Eyes: PERRL. Conjunctivae normal.  CV: Good peripheral perfusion.  Resp: Normal effort.  Abd: Soft, non-tender. No distention.  Neuro: At baseline. No gross neurological deficits.  Musculoskeletal: Normal ROM of all extremities.  Focused Exam: The left great toe shows significant ecchymosis along the dorsum aspect.  Normal  range of motion.  He does have some osseous tenderness along the distal and proximal ends of the toe.  No injury to the nailbed noted.  No gross from reach to the right knee.  Normal range of motion, including flexion and extension.  Negative anterior drawer.  No pain with valgus/varus maneuvering.  He does have a small abrasion along the anterior aspect of the knee.  No active bleeding or discharge.  He is still able to ambulate well on his own.  Physical Exam    ED Results / Procedures / Treatments  Labs (all labs ordered are listed, but only abnormal results are displayed) Labs Reviewed - No data to display   EKG N/A.    RADIOLOGY  ED Provider Interpretation: I personally reviewed and interpreted these images, right knee x-ray does not show any acute findings.  Left foot x-ray shows acute minimally displaced fracture of the proximal phalanx of the great toe.  DG Knee 2 Views Right  Result Date: 07/05/2022 CLINICAL DATA:  Fall 07/02/2022.  Right knee pain. EXAM: RIGHT KNEE - 1-2 VIEW COMPARISON:  Right tibia and fibula radiographs 07/03/2006 FINDINGS: Moderate patellofemoral joint space narrowing with inferior greater than  superior patellar degenerative osteophytes. Mild chronic enthesopathic change at the quadriceps insertion on the patella. No knee joint effusion. Minimal medial compartment joint space narrowing. Mild peripheral medial and lateral compartment degenerative osteophytes. Mild medial and lateral compartment chondrocalcinosis. No acute fracture or dislocation. IMPRESSION: 1. Moderate patellofemoral and minimal medial and lateral compartment osteoarthritis. 2. Mild medial and lateral compartment chondrocalcinosis. Electronically Signed   By: Yvonne Kendall M.D.   On: 07/05/2022 13:36   DG Foot 2 Views Left  Result Date: 07/05/2022 CLINICAL DATA:  Fall 07/02/2022.  Redness, swelling, and pain. EXAM: LEFT FOOT - 2 VIEW COMPARISON:  None Available. FINDINGS: There is an acute  oblique fracture of the mid shaft of the proximal phalanx of the great toe with mild 2-3 mm lateral displacement of the distal fracture component with respect to the proximal fracture component. There is up to 2 mm plantar cortical step-off on lateral view. Additional oblique fracture within the distal medial corner of the proximal phalanx of the great toe with distal intra-articular extension. Moderate to mm cortical step-off at the articular surface but otherwise no significant displacement. Mild-to-moderate second through fifth interphalangeal joint space narrowing. Large plantar and mild posterior calcaneal heel spurs. Mild dorsal talonavicular degenerative osteophytosis. IMPRESSION: 1. Acute, minimally displaced fractures of the proximal phalanx of the great toe. The more distal fracture line contacts the distal articular surface. 2. Large plantar and mild posterior calcaneal heel spurs. Electronically Signed   By: Yvonne Kendall M.D.   On: 07/05/2022 13:34    PROCEDURES:  Critical Care performed: N/A.  Procedures    MEDICATIONS ORDERED IN ED: Medications - No data to display   IMPRESSION / MDM / [place] / ED COURSE  I reviewed the triage vital signs and the nursing notes.                              Differential diagnosis includes, but is not limited to, phalangeal fracture, toe dislocation, metatarsal fracture, stress fracture, knee sprain, abrasion, ACL/PCL injury, MCL/LCL injury, patella fracture.  Assessment/Plan Patient presents with right knee pain and left great toe pain following mechanical fall a few days prior.  He does have some significant ecchymosis present along the left great toe, as well as a abrasion on his right knee.  Otherwise unremarkable exam.  His x-rays do not show any acute findings in the right knee, however his left great toe does show acute minimally displaced fracture of the proximal phalanx of the great toe with the more distal fracture line  contacting the distal articular surface.  Placed him in a buddy tape splint and provide him with a cam boot.  Provided him with a referral to podiatry to follow-up with for reevaluation.  Provide him with a short prescription for oxycodone/acetaminophen to be used as needed.  In addition, will provide him with a brief course of cephalexin to treat for possible infection given that he did have a open wound at the time along the site.  Fortunately, his knee x-ray was negative for any acute findings.  Encouraged him to continue to wash the abrasion daily with soap and water and apply topical antibiotic ointment such as Neosporin to prevent infection.  Provided the patient with anticipatory guidance, return precautions, and educational material. Encouraged the patient to return to the emergency department at any time if they begin to experience any new or worsening symptoms. Patient expressed understanding and agreed with the  plan.   Patient's presentation is most consistent with acute complicated illness / injury requiring diagnostic workup.       FINAL CLINICAL IMPRESSION(S) / ED DIAGNOSES   Final diagnoses:  Closed displaced fracture of phalanx of left great toe, unspecified phalanx, initial encounter     Rx / DC Orders   ED Discharge Orders          Ordered    cephALEXin (KEFLEX) 500 MG capsule  2 times daily        07/05/22 1342    oxyCODONE-acetaminophen (PERCOCET/ROXICET) 5-325 MG tablet  Every 8 hours PRN        07/05/22 1427             Note:  This document was prepared using Dragon voice recognition software and may include unintentional dictation errors.   Teodoro Spray, [place] 07/05/22 1648    Nathaniel Man, MD 07/06/22 [phone]

## 2022-07-05 NOTE — ED Triage Notes (Signed)
Pt sts that he has been having with his left 1st toe. Pt sts that he tripped on his deck.

## 2022-07-13 ENCOUNTER — Telehealth: Payer: Self-pay

## 2022-07-13 NOTE — Telephone Encounter (Signed)
        Patient  visited [facility] on 1/29    Telephone encounter attempt :  1st  A HIPAA compliant voice message was left requesting a return call.  Instructed patient to call back    [facility] [phone] [address], [place], [place] [address] Phone: [phone] Email: [email or web address]

## 2022-07-14 ENCOUNTER — Telehealth: Payer: Self-pay

## 2022-07-14 NOTE — Telephone Encounter (Signed)
     Patient  visit on 1/29  at [place]    Have you been able to follow up with your primary care physician? Yes   The patient was or was not able to obtain any needed medicine or equipment. Yes   Are there diet recommendations that you are having difficulty following? Na   Patient expresses understanding of discharge instructions and education provided has no other needs at this time.  Yes      Davine Coba [facility], [place] [phone] [address], [place], [place] [address] Phone: [phone] Email: [email or web address]    

## 2022-07-15 DIAGNOSIS — G459 Transient cerebral ischemic attack, unspecified: Secondary | ICD-10-CM | POA: Diagnosis not present

## 2022-07-15 DIAGNOSIS — S92415A Nondisplaced fracture of proximal phalanx of left great toe, initial encounter for closed fracture: Secondary | ICD-10-CM | POA: Diagnosis not present

## 2022-07-15 DIAGNOSIS — E119 Type 2 diabetes mellitus without complications: Secondary | ICD-10-CM | POA: Diagnosis not present

## 2022-07-26 ENCOUNTER — Other Ambulatory Visit: Payer: Self-pay

## 2022-07-26 ENCOUNTER — Other Ambulatory Visit: Payer: Self-pay | Admitting: Internal Medicine

## 2022-08-09 DIAGNOSIS — S92412A Displaced fracture of proximal phalanx of left great toe, initial encounter for closed fracture: Secondary | ICD-10-CM | POA: Diagnosis not present

## 2022-08-12 DIAGNOSIS — E119 Type 2 diabetes mellitus without complications: Secondary | ICD-10-CM | POA: Diagnosis not present

## 2022-08-12 DIAGNOSIS — S92415A Nondisplaced fracture of proximal phalanx of left great toe, initial encounter for closed fracture: Secondary | ICD-10-CM | POA: Diagnosis not present

## 2022-08-12 DIAGNOSIS — G459 Transient cerebral ischemic attack, unspecified: Secondary | ICD-10-CM | POA: Diagnosis not present

## 2022-08-23 ENCOUNTER — Encounter: Payer: Self-pay | Admitting: Internal Medicine

## 2022-08-23 NOTE — Progress Notes (Unsigned)
    Subjective:    Patient ID: Sean Mullins, male    DOB: 10/03/1950, 72 y.o.   MRN: [id number]      HPI Bishara is here for No chief complaint on file.   Pain in lower extremities -    Lump on testicle - hydrocele - painless   Varicocele - dialation of venous plexus - goes away w/ laying flat. 90% on left, bag of worms  Epidydimal cysts  Epididmytis - acute onset, may e swelling, erythema fever  Testical ca - painfull lump     Medications and allergies reviewed with patient and updated if appropriate.  Current Outpatient Medications on File Prior to Visit  Medication Sig Dispense Refill   amLODipine (NORVASC) 5 MG tablet TAKE 1 TABLET BY MOUTH ONCE A DAY 90 tablet 3   aspirin EC 81 MG tablet Take 1 tablet (81 mg total) by mouth daily. 30 tablet 0   dapagliflozin propanediol (FARXIGA) 10 MG TABS tablet Take 1 tablet (10 mg total) by mouth daily before breakfast. 30 tablet 5   EPINEPHrine 0.3 mg/0.3 mL IJ SOAJ injection Inject 0.3 mg into the muscle as needed for anaphylaxis. 1 each 2   fluticasone (FLONASE) 50 MCG/ACT nasal spray Place 2 sprays into both nostrils daily.     lisinopril (ZESTRIL) 20 MG tablet TAKE 1 TABLET BY MOUTH ONCE DAILY (PLEASE SCHEDULE FOLLOW UP VISIT WITH MD) 90 tablet 0   Multiple Vitamin (MULTIVITAMIN WITH MINERALS) TABS tablet Take 1 tablet by mouth daily.     No current facility-administered medications on file prior to visit.    Review of Systems     Objective:  There were no vitals filed for this visit. BP Readings from Last 3 Encounters:  07/05/22 124/67  05/21/22 120/78  05/21/22 120/78   Wt Readings from Last 3 Encounters:  07/05/22 276 lb (125.2 kg)  05/21/22 287 lb (130.2 kg)  05/21/22 287 lb (130.2 kg)   There is no height or weight on file to calculate BMI.    Physical Exam         Assessment & Plan:    See Problem List for Assessment and Plan of chronic medical problems.

## 2022-08-24 ENCOUNTER — Ambulatory Visit (INDEPENDENT_AMBULATORY_CARE_PROVIDER_SITE_OTHER): Payer: PPO | Admitting: Internal Medicine

## 2022-08-24 ENCOUNTER — Encounter: Payer: Self-pay | Admitting: Internal Medicine

## 2022-08-24 VITALS — BP 120/68 | HR 55 | Temp 98.2°F | Ht 74.5 in | Wt 264.0 lb

## 2022-08-24 DIAGNOSIS — N5082 Scrotal pain: Secondary | ICD-10-CM | POA: Diagnosis not present

## 2022-08-24 DIAGNOSIS — I10 Essential (primary) hypertension: Secondary | ICD-10-CM

## 2022-08-24 DIAGNOSIS — E119 Type 2 diabetes mellitus without complications: Secondary | ICD-10-CM | POA: Diagnosis not present

## 2022-08-24 DIAGNOSIS — N451 Epididymitis: Secondary | ICD-10-CM | POA: Diagnosis not present

## 2022-08-24 LAB — URINALYSIS, ROUTINE W REFLEX MICROSCOPIC
Bilirubin Urine: NEGATIVE
Hgb urine dipstick: NEGATIVE
Ketones, ur: NEGATIVE
Leukocytes,Ua: NEGATIVE
Nitrite: NEGATIVE
RBC / HPF: NONE SEEN (ref 0–?)
Specific Gravity, Urine: 1.01 (ref 1.000–1.030)
Total Protein, Urine: NEGATIVE
Urine Glucose: >=1000 — AB
Urobilinogen, UA: 0.2 (ref 0.0–1.0)
pH: 6 (ref 5.0–8.0)

## 2022-08-24 MED ORDER — DOXYCYCLINE HYCLATE 100 MG PO TABS: 100.0000 mg | ORAL_TABLET | Freq: Two times a day (BID) | ORAL | 0 refills | Status: AC

## 2022-08-24 NOTE — Assessment & Plan Note (Signed)
Chronic Blood pressure very well-controlled Continue amlodipine 5 mg daily, lisinopril 20 mg daily

## 2022-08-24 NOTE — Assessment & Plan Note (Signed)
Acute Symptoms and exam consistent with epididymitis Start doxycycline 100 mg twice daily x 7 days Check UA, urine culture Will order scrotal ultrasound Referral to urology just in case symptoms do not improve or depending on ultrasound results

## 2022-08-24 NOTE — Patient Instructions (Addendum)
      Urine tests were ordered.   The lab is on the first floor.    Medications changes include :   doxycyline 100 mg twice daily for 7 days   An ultrasound of your scrotum was ordered.    A referral was ordered for urology.     Someone will call you to schedule an appointment.    Return if symptoms worsen or fail to improve.

## 2022-08-24 NOTE — Assessment & Plan Note (Signed)
Chronic Sugars been well-controlled at home Continue Farxiga 10 mg daily

## 2022-08-25 LAB — URINE CULTURE

## 2022-09-07 ENCOUNTER — Ambulatory Visit
Admission: RE | Admit: 2022-09-07 | Discharge: 2022-09-07 | Disposition: A | Discharge status: Home / Self Care | Payer: PPO | Source: Ambulatory Visit | Attending: Internal Medicine | Admitting: Internal Medicine

## 2022-09-07 DIAGNOSIS — N433 Hydrocele, unspecified: Secondary | ICD-10-CM | POA: Diagnosis not present

## 2022-09-07 DIAGNOSIS — N5082 Scrotal pain: Secondary | ICD-10-CM

## 2022-09-29 ENCOUNTER — Other Ambulatory Visit: Payer: Self-pay | Admitting: Internal Medicine

## 2022-11-08 ENCOUNTER — Other Ambulatory Visit: Payer: Self-pay | Admitting: Internal Medicine

## 2022-11-12 ENCOUNTER — Encounter: Payer: Self-pay | Admitting: Internal Medicine

## 2022-11-21 ENCOUNTER — Encounter: Payer: Self-pay | Admitting: Internal Medicine

## 2022-11-21 NOTE — Patient Instructions (Addendum)
      Blood work was ordered.   The lab is on the first floor.    Medications changes include :   none      Return in about 6 months (around 05/24/2023) for Physical Exam.

## 2022-11-21 NOTE — Progress Notes (Unsigned)
Subjective:    Patient ID: Sean Mullins, male    DOB: [birth year], 72 y.o.   MRN: [id number]     HPI Sean Mullins is here for follow up of his chronic medical problems.   Lost weight - decreased portions, stopped eating what he should not be eating and doing some walking.  He feels better.  Sugars were on the low side at times and he did decrease the Farxiga to 5 mg daily.  Sugars well-controlled.  Blood pressure well-controlled at home    Medications and allergies reviewed with patient and updated if appropriate.  Current Outpatient Medications on File Prior to Visit  Medication Sig Dispense Refill   amLODipine (NORVASC) 5 MG tablet TAKE 1 TABLET BY MOUTH ONCE A DAY 90 tablet 3   aspirin EC 81 MG tablet Take 1 tablet (81 mg total) by mouth daily. 30 tablet 0   EPINEPHrine 0.3 mg/0.3 mL IJ SOAJ injection Inject 0.3 mg into the muscle as needed for anaphylaxis. 1 each 2   FARXIGA 10 MG TABS tablet TAKE ONE TABLET BY MOUTH DAILY BEFORE BREAKFAST 30 tablet 5   fluticasone (FLONASE) 50 MCG/ACT nasal spray Place 2 sprays into both nostrils daily.     lisinopril (ZESTRIL) 20 MG tablet Take 1 tablet (20 mg total) by mouth daily. 90 tablet 2   Multiple Vitamin (MULTIVITAMIN WITH MINERALS) TABS tablet Take 1 tablet by mouth daily.     No current facility-administered medications on file prior to visit.     Review of Systems  Constitutional:  Negative for fever.  HENT:  Positive for postnasal drip.   Respiratory:  Negative for cough, shortness of breath and wheezing.   Cardiovascular:  Negative for chest pain, palpitations and leg swelling.  Neurological:  Positive for light-headedness (once when sugar was low). Negative for headaches.       Objective:   Vitals:   11/22/22 0832  BP: 120/70  Pulse: (!) 59  Temp: 97.6 F (36.4 C)  SpO2: 96%   BP Readings from Last 3 Encounters:  11/22/22 120/70  08/24/22 120/68  07/05/22 124/67   Wt Readings from Last 3 Encounters:   11/22/22 248 lb (112.5 kg)  08/24/22 264 lb (119.7 kg)  07/05/22 276 lb (125.2 kg)   Body mass index is 31.84 kg/m.    Physical Exam Constitutional:      General: He is not in acute distress.    Appearance: Normal appearance. He is not ill-appearing.  HENT:     Head: Normocephalic and atraumatic.  Eyes:     Conjunctiva/sclera: Conjunctivae normal.  Cardiovascular:     Rate and Rhythm: Normal rate and regular rhythm.     Heart sounds: Normal heart sounds.  Pulmonary:     Effort: Pulmonary effort is normal. No respiratory distress.     Breath sounds: Normal breath sounds. No wheezing or rales.  Musculoskeletal:     Right lower leg: No edema.     Left lower leg: No edema.  Skin:    General: Skin is warm and dry.     Findings: No rash.  Neurological:     Mental Status: He is alert. Mental status is at baseline.  Psychiatric:        Mood and Affect: Mood normal.        Lab Results  Component Value Date   WBC 7.7 05/21/2022   HGB 15.8 05/21/2022   HCT 45.7 05/21/2022   PLT 229.0 05/21/2022  GLUCOSE 186 (H) 05/21/2022   CHOL 177 05/21/2022   TRIG 195.0 (H) 05/21/2022   HDL 39.50 05/21/2022   LDLCALC 99 05/21/2022   ALT 58 (H) 05/21/2022   AST 50 (H) 05/21/2022   NA 135 05/21/2022   K 4.4 05/21/2022   CL 99 05/21/2022   CREATININE 0.76 05/21/2022   BUN 9 05/21/2022   CO2 27 05/21/2022   TSH 1.34 05/21/2022   PSA 0.96 05/20/2021   INR 1.0 05/06/2020   HGBA1C 9.1 (H) 05/21/2022   MICROALBUR 1.1 05/21/2022     Assessment & Plan:    See Problem List for Assessment and Plan of chronic medical problems.

## 2022-11-22 ENCOUNTER — Ambulatory Visit (INDEPENDENT_AMBULATORY_CARE_PROVIDER_SITE_OTHER): Payer: PPO | Admitting: Internal Medicine

## 2022-11-22 VITALS — BP 120/70 | HR 59 | Temp 97.6°F | Ht 74.0 in | Wt 248.0 lb

## 2022-11-22 DIAGNOSIS — T466X5A Adverse effect of antihyperlipidemic and antiarteriosclerotic drugs, initial encounter: Secondary | ICD-10-CM

## 2022-11-22 DIAGNOSIS — E7849 Other hyperlipidemia: Secondary | ICD-10-CM

## 2022-11-22 DIAGNOSIS — Z7984 Long term (current) use of oral hypoglycemic drugs: Secondary | ICD-10-CM

## 2022-11-22 DIAGNOSIS — Z8673 Personal history of transient ischemic attack (TIA), and cerebral infarction without residual deficits: Secondary | ICD-10-CM

## 2022-11-22 DIAGNOSIS — I10 Essential (primary) hypertension: Secondary | ICD-10-CM

## 2022-11-22 DIAGNOSIS — E669 Obesity, unspecified: Secondary | ICD-10-CM

## 2022-11-22 DIAGNOSIS — M609 Myositis, unspecified: Secondary | ICD-10-CM | POA: Diagnosis not present

## 2022-11-22 DIAGNOSIS — E119 Type 2 diabetes mellitus without complications: Secondary | ICD-10-CM

## 2022-11-22 DIAGNOSIS — I2510 Atherosclerotic heart disease of native coronary artery without angina pectoris: Secondary | ICD-10-CM | POA: Diagnosis not present

## 2022-11-22 LAB — LIPID PANEL
Cholesterol: 154 mg/dL (ref 0–200)
HDL: 39 mg/dL — ABNORMAL LOW (ref >39.00)
LDL Cholesterol: 95 mg/dL (ref 0–99)
NonHDL: 114.88
Total CHOL/HDL Ratio: 4
Triglycerides: 99 mg/dL (ref 0.0–149.0)
VLDL: 19.8 mg/dL (ref 0.0–40.0)

## 2022-11-22 LAB — COMPREHENSIVE METABOLIC PANEL
ALT: 19 U/L (ref 0–53)
AST: 16 U/L (ref 0–37)
Albumin: 4.4 g/dL (ref 3.5–5.2)
Alkaline Phosphatase: 75 U/L (ref 39–117)
BUN: 15 mg/dL (ref 6–23)
CO2: 29 mEq/L (ref 19–32)
Calcium: 9.9 mg/dL (ref 8.4–10.5)
Chloride: 102 mEq/L (ref 96–112)
Creatinine, Ser: 0.89 mg/dL (ref 0.40–1.50)
GFR: 87.29 mL/min (ref >60.00)
Glucose, Bld: 132 mg/dL — ABNORMAL HIGH (ref 70–99)
Potassium: 5.3 mEq/L — ABNORMAL HIGH (ref 3.5–5.1)
Sodium: 138 mEq/L (ref 135–145)
Total Bilirubin: 0.9 mg/dL (ref 0.2–1.2)
Total Protein: 8 g/dL (ref 6.0–8.3)

## 2022-11-22 LAB — HEMOGLOBIN A1C: Hgb A1c MFr Bld: 5.5 % (ref 4.6–6.5)

## 2022-11-22 MED ORDER — DAPAGLIFLOZIN PROPANEDIOL 10 MG PO TABS: 10.0000 mg | ORAL_TABLET | Freq: Every day | ORAL | 2 refills | Status: DC

## 2022-11-22 NOTE — Assessment & Plan Note (Addendum)
did not tolerate Crestor Does not want to try another statin Discussed other options, but will avoid statins

## 2022-11-22 NOTE — Assessment & Plan Note (Addendum)
Chronic No symptoms consistent with angina Continue aspirin 81 mg daily Recommended starting Zetia 10 mg daily-will see what LDL is, but likely still needs it Discussed why it was recommended he is on a cholesterol medication-LDL goal less than 70

## 2022-11-22 NOTE — Assessment & Plan Note (Addendum)
Chronic BMI more than 31.84 kg/m with diabetes, hyperlipidemia, hypertension Has lost a significant weight since he was here last Will continue his weight loss efforts Stressed regular exercise, decrease portions, healthy diet

## 2022-11-22 NOTE — Assessment & Plan Note (Signed)
Chronic Blood pressure very well-controlled CMP Continue amlodipine 5 mg daily, lisinopril 20 mg daily

## 2022-11-22 NOTE — Assessment & Plan Note (Addendum)
Chronic Sugars been well-controlled at home Check a1c Continue Farxiga 5 mg daily

## 2022-11-22 NOTE — Assessment & Plan Note (Addendum)
Chronic Did not tolerate Crestor-joint pain, achiness Discussed increased risk of stroke, CAD Regular exercise and healthy diet encouraged Check lipid panel Discussed starting Zetia 10 mg daily-we will see what his LDL is-discussed recommendation for being less than 70

## 2022-11-22 NOTE — Assessment & Plan Note (Addendum)
History of TIA Continue aspirin 81 mg daily Did not tolerate Crestor Advised starting Zetia 10 mg daily if LDL more than 70 Stressed risk reduction Stressed good sugar control Blood pressure controlled

## 2023-04-27 ENCOUNTER — Ambulatory Visit: Payer: PPO

## 2023-04-27 VITALS — BP 120/70 | HR 62 | Temp 97.8°F | Resp 16 | Ht 74.0 in | Wt 252.0 lb

## 2023-04-27 DIAGNOSIS — Z0000 Encounter for general adult medical examination without abnormal findings: Secondary | ICD-10-CM | POA: Diagnosis not present

## 2023-04-27 NOTE — Progress Notes (Addendum)
Subjective:   Sean Mullins is a 72 y.o. male who presents for Medicare Annual/Subsequent preventive examination.  Visit Complete: In person  Cardiac Risk Factors include: advanced age (>4men, >29 women);family history of premature cardiovascular disease;hypertension;male gender;obesity (BMI >30kg/m2)     Objective:    Today's Vitals   04/27/23 1501  BP: 120/70  Pulse: 62  Resp: 16  Temp: 97.8 F (36.6 C)  TempSrc: Oral  SpO2: 92%  Weight: 252 lb (114.3 kg)  Height: 6\' 2"  (1.88 m)  PainSc: 0-No pain   Body mass index is 32.35 kg/m.     04/27/2023    3:25 PM 05/21/2022   11:36 AM 05/20/2021    9:52 AM 08/05/2020   10:05 AM 07/04/2020    7:58 AM 05/06/2020    6:34 PM 05/06/2020    9:23 AM  Advanced Directives  Does Patient Have a Medical Advance Directive? Yes Yes No No No No No  Type of Estate agent of [place];Living will Living will       Does patient want to make changes to medical advance directive?   No - Patient declined      Copy of Healthcare Power of Attorney in Chart? No - copy requested        Would patient like information on creating a medical advance directive?     No - Patient declined No - Patient declined     Current Medications (verified) Outpatient Encounter Medications as of 04/27/2023  Medication Sig   amLODipine (NORVASC) 5 MG tablet TAKE 1 TABLET BY MOUTH ONCE A DAY   aspirin EC 81 MG tablet Take 1 tablet (81 mg total) by mouth daily.   dapagliflozin propanediol (FARXIGA) 10 MG TABS tablet Take 1 tablet (10 mg total) by mouth daily.   EPINEPHrine 0.3 mg/0.3 mL IJ SOAJ injection Inject 0.3 mg into the muscle as needed for anaphylaxis.   fluticasone (FLONASE) 50 MCG/ACT nasal spray Place 2 sprays into both nostrils daily.   lisinopril (ZESTRIL) 20 MG tablet Take 1 tablet (20 mg total) by mouth daily.   Multiple Vitamin (MULTIVITAMIN WITH MINERALS) TABS tablet Take 1 tablet by mouth daily.   No facility-administered  encounter medications on file as of 04/27/2023.    Allergies (verified) Ciprofloxacin hcl, Bystolic [nebivolol hcl], Crestor [rosuvastatin], and Semaglutide   History: Past Medical History:  Diagnosis Date   Anxiety    Arthritis    Diverticula of colon    TIA (transient ischemic attack)    November 2013   Past Surgical History:  Procedure Laterality Date   COLONOSCOPY WITH PROPOFOL N/A 07/04/2020   Procedure: COLONOSCOPY WITH PROPOFOL;  Surgeon: Pasty Spillers, MD;  Location: [facility] ENDOSCOPY;  Service: Endoscopy;  Laterality: N/A;   DUODENAL DIVERTICULECTOMY     2003   FLEXIBLE SIGMOIDOSCOPY N/A 08/05/2020   Procedure: FLEXIBLE SIGMOIDOSCOPY;  Surgeon: Pasty Spillers, MD;  Location: [facility] ENDOSCOPY;  Service: Endoscopy;  Laterality: N/A;   KNEE ARTHROPLASTY Left    Family History  Problem Relation Age of Onset   Transient ischemic attack Father    Social History   Socioeconomic History   Marital status: Married    Spouse name: Not on file   Number of children: Not on file   Years of education: Not on file   Highest education level: Not on file  Occupational History   Not on file  Tobacco Use   Smoking status: Former    Current packs/day: 0.00  Average packs/day: 1.5 packs/day for 30.0 years (45.0 ttl pk-yrs)    Types: Cigarettes    Start date: 06/07/1966    Quit date: 06/07/1996    Years since quitting: 26.9   Smokeless tobacco: Never  Vaping Use   Vaping status: Never Used  Substance and Sexual Activity   Alcohol use: Yes    Comment: ocassional    Drug use: Never   Sexual activity: Not on file  Other Topics Concern   Not on file  Social History Narrative   No regular exercise.    Social Determinants of Health   Financial Resource Strain: Low Risk  (04/27/2023)   Overall Financial Resource Strain (CARDIA)    Difficulty of Paying Living Expenses: Not hard at all  Food Insecurity: No Food Insecurity (04/27/2023)   Hunger Vital Sign    Worried  About Running Out of Food in the Last Year: Never true    Ran Out of Food in the Last Year: Never true  Transportation Needs: No Transportation Needs (04/27/2023)   PRAPARE - Administrator, Civil Service (Medical): No    Lack of Transportation (Non-Medical): No  Physical Activity: Sufficiently Active (04/27/2023)   Exercise Vital Sign    Days of Exercise per Week: 5 days    Minutes of Exercise per Session: 30 min  Stress: No Stress Concern Present (04/27/2023)   [employer] of Occupational Health - Occupational Stress Questionnaire    Feeling of Stress : Not at all  Social Connections: Socially Integrated (04/27/2023)   Social Connection and Isolation Panel [NHANES]    Frequency of Communication with Friends and Family: More than three times a week    Frequency of Social Gatherings with Friends and Family: Once a week    Attends Religious Services: More than 4 times per year    Active Member of [employer] or Organizations: Yes    Attends Engineer, structural: More than 4 times per year    Marital Status: Married    Tobacco Counseling Counseling given: Not Answered   Clinical Intake:  Pre-visit preparation completed: Yes  Pain : No/denies pain Pain Score: 0-No pain     Nutritional Risks: None Diabetes: No  How often do you need to have someone help you when you read instructions, pamphlets, or other written materials from your doctor or pharmacy?: 1 - Never What is the last grade level you completed in school?: HSG  Interpreter Needed?: No  Information entered by :: Laina Guerrieri N. Consandra Laske, LPN.   Activities of Daily Living    04/27/2023    3:06 PM 05/21/2022   12:04 PM  In your present state of health, do you have any difficulty performing the following activities:  Hearing? 0   Vision? 0   Difficulty concentrating or making decisions? 0   Walking or climbing stairs? 0   Dressing or bathing? 0   Doing errands, shopping? 0   Preparing Food  and eating ? N N  Using the Toilet? N N  In the past six months, have you accidently leaked urine? N N  Do you have problems with loss of bowel control? N N  Managing your Medications? N N  Managing your Finances? N N  Housekeeping or managing your Housekeeping? N N    Patient Care Team: Pincus Sanes, MD as PCP - General (Internal Medicine) Domingo Madeira, OD as Consulting Physician (Optometry)  Indicate any recent Medical Services you may have received from other than [facility]  providers in the past year (date may be approximate).     Assessment:   This is a routine wellness examination for [place].  Hearing/Vision screen Hearing Screening - Comments:: Patient denied any hearing difficulty.   No hearing aids.  Vision Screening - Comments:: Patient does wear readers.  Cataracts removed, bilateral. Eye exam done by: Edger House, OD.    Goals Addressed             This Visit's Progress    My goal for 2025 is to keep my weight down.        Depression Screen    04/27/2023    3:02 PM 11/22/2022    8:37 AM 08/24/2022    1:48 PM 05/21/2022   11:41 AM 05/21/2022   11:36 AM 05/21/2022   11:06 AM 05/20/2021    9:51 AM  PHQ 2/9 Scores  PHQ - 2 Score 0 0 0 0 0 0 0  PHQ- 9 Score 0     0     Fall Risk    04/27/2023    3:07 PM 11/22/2022    8:37 AM 08/24/2022    1:48 PM 05/21/2022   11:36 AM 05/21/2022   11:06 AM  Fall Risk   Falls in the past year? 0 1 0 0 0  Number falls in past yr: 0 0 0 0 0  Injury with Fall? 0 0 0 0 0  Risk for fall due to : No Fall Risks No Fall Risks No Fall Risks No Fall Risks No Fall Risks  Follow up Falls prevention discussed Falls evaluation completed Falls evaluation completed Falls evaluation completed Falls evaluation completed    MEDICARE RISK AT HOME: Medicare Risk at Home Any stairs in or around the home?: Yes If so, are there any without handrails?: No Home free of loose throw rugs in walkways, pet beds, electrical cords, etc?:  Yes Adequate lighting in your home to reduce risk of falls?: Yes Life alert?: No Use of a cane, walker or w/c?: No Grab bars in the bathroom?: Yes Shower chair or bench in shower?: No Elevated toilet seat or a handicapped toilet?: Yes  TIMED UP AND GO:  Was the test performed?  Yes  Length of time to ambulate 10 feet: 8 sec Gait steady and fast without use of assistive device    Cognitive Function:    04/27/2023    3:06 PM  MMSE - Mini Mental State Exam  Orientation to time 5  Orientation to Place 5  Registration 3  Attention/ Calculation 5  Recall 3  Language- name 2 objects 2  Language- repeat 1  Language- follow 3 step command 3  Language- read & follow direction 1  Write a sentence 1  Copy design 1  Total score 30        04/27/2023    3:04 PM 05/21/2022   11:51 AM  6CIT Screen  What Year? 0 points 0 points  What month? 0 points 0 points  What time? 0 points 0 points  Count back from 20 0 points   Months in reverse 0 points 0 points  Repeat phrase 0 points 0 points  Total Score 0 points     Immunizations Immunization History  Administered Date(s) Administered   Fluad Quad(high Dose 65+) 02/28/2019, 02/28/2020, 05/21/2022   Influenza, High Dose Seasonal PF 02/24/2018   Influenza,inj,Quad PF,6+ Mos 02/22/2017   Influenza-Unspecified 04/17/2015, 03/04/2021   Pneumococcal Conjugate-13 08/28/2018   Pneumococcal Polysaccharide-23 08/28/2019   Tdap 12/04/2021  Zoster Recombinant(Shingrix) 03/23/2021, 05/20/2021    TDAP status: Up to date  Flu Vaccine status: Declined, Education has been provided regarding the importance of this vaccine but patient still declined. Advised may receive this vaccine at local pharmacy or Health Dept. Aware to provide a copy of the vaccination record if obtained from local pharmacy or Health Dept. Verbalized acceptance and understanding.  Pneumococcal vaccine status: Up to date  Covid-19 vaccine status: Declined, Education has  been provided regarding the importance of this vaccine but patient still declined. Advised may receive this vaccine at local pharmacy or Health Dept.or vaccine clinic. Aware to provide a copy of the vaccination record if obtained from local pharmacy or Health Dept. Verbalized acceptance and understanding.  Qualifies for Shingles Vaccine? Yes   Zostavax completed No   Shingrix Completed?: Yes  Screening Tests Health Maintenance  Topic Date Due   INFLUENZA VACCINE  01/06/2023   FOOT EXAM  01/14/2023   OPHTHALMOLOGY EXAM  01/28/2023   COVID-19 Vaccine (1 - 2023-24 season) Never done   Diabetic kidney evaluation - Urine ACR  05/22/2023   Colonoscopy  07/05/2023   HEMOGLOBIN A1C  05/24/2023   Diabetic kidney evaluation - eGFR measurement  11/22/2023   Medicare Annual Wellness (AWV)  04/26/2024   DTaP/Tdap/Td (2 - Td or Tdap) 12/05/2031   Pneumonia Vaccine 48+ Years old  Completed   Hepatitis C Screening  Completed   HPV VACCINES  Aged Out   Zoster Vaccines- Shingrix  Discontinued    Health Maintenance  Health Maintenance Due  Topic Date Due   INFLUENZA VACCINE  01/06/2023   FOOT EXAM  01/14/2023   OPHTHALMOLOGY EXAM  01/28/2023   COVID-19 Vaccine (1 - 2023-24 season) Never done   Diabetic kidney evaluation - Urine ACR  05/22/2023   Colonoscopy  07/05/2023    Colorectal cancer screening: Type of screening: Colonoscopy. Completed 07/04/2020. Repeat every 3 years  Lung Cancer Screening: (Low Dose CT Chest recommended if Age 11-80 years, 20 pack-year currently smoking OR have quit w/in 15years.) does not qualify.   Lung Cancer Screening Referral: NO  Additional Screening:  Hepatitis C Screening: does qualify; Completed 04/08/2016  Vision Screening: Recommended annual ophthalmology exams for early detection of glaucoma and other disorders of the eye. Is the patient up to date with their annual eye exam?  Yes  Who is the provider or what is the name of the office in which the  patient attends annual eye exams? [facility], OD. If pt is not established with a provider, would they like to be referred to a provider to establish care? No .   Dental Screening: Recommended annual dental exams for proper oral hygiene  Diabetic Foot Exam: N/A  Community Resource Referral / Chronic Care Management: CRR required this visit?  No   CCM required this visit?  No     Plan:     I have personally reviewed and noted the following in the patient's chart:   Medical and social history Use of alcohol, tobacco or illicit drugs  Current medications and supplements including opioid prescriptions. Patient is not currently taking opioid prescriptions. Functional ability and status Nutritional status Physical activity Advanced directives List of other physicians Hospitalizations, surgeries, and ER visits in previous 12 months Vitals Screenings to include cognitive, depression, and falls Referrals and appointments  In addition, I have reviewed and discussed with patient certain preventive protocols, quality metrics, and best practice recommendations. A written personalized care plan for preventive services as well as  general preventive health recommendations were provided to patient.     Mickeal Needy, LPN   16/03/[id number]   After Visit Summary: (In Person-Printed) AVS printed and given to the patient  Nurse Notes: None

## 2023-04-27 NOTE — Patient Instructions (Signed)
Mr. Amado , Thank you for taking time to come for your Medicare Wellness Visit. I appreciate your ongoing commitment to your health goals. Please review the following plan we discussed and let me know if I can assist you in the future.   Referrals/Orders/Follow-Ups/Clinician Recommendations: No  This is a list of the screening recommended for you and due dates:  Health Maintenance  Topic Date Due   Flu Shot  01/06/2023   Complete foot exam   01/14/2023   Eye exam for diabetics  01/28/2023   COVID-19 Vaccine (1 - 2023-24 season) Never done   Yearly kidney health urinalysis for diabetes  05/22/2023   Colon Cancer Screening  07/05/2023   Hemoglobin A1C  05/24/2023   Yearly kidney function blood test for diabetes  11/22/2023   Medicare Annual Wellness Visit  04/26/2024   DTaP/Tdap/Td vaccine (2 - Td or Tdap) 12/05/2031   Pneumonia Vaccine  Completed   Hepatitis C Screening  Completed   HPV Vaccine  Aged Out   Zoster (Shingles) Vaccine  Discontinued    Advanced directives: (Copy Requested) Please bring a copy of your health care power of attorney and living will to the office to be added to your chart at your convenience.  Next Medicare Annual Wellness Visit scheduled for next year: No

## 2023-05-24 ENCOUNTER — Encounter: Payer: Self-pay | Admitting: Internal Medicine

## 2023-05-24 NOTE — Progress Notes (Unsigned)
Subjective:    Patient ID: Sean Mullins, male    DOB: 06/19/1950, 72 y.o.   MRN: [id number]     HPI Sean Mullins is here for a physical exam and his chronic medical problems.   The other day he had a little dizziness - sugar was 105.  Took a meclizine and napped - got better.  He is not sure if he did not drink enough water that day or something else, but he does have dizziness like this on a rare occasion.  Sugars have been controlled  - today 128.  Usually 120's   Medications and allergies reviewed with patient and updated if appropriate.  Current Outpatient Medications on File Prior to Visit  Medication Sig Dispense Refill   amLODipine (NORVASC) 5 MG tablet TAKE 1 TABLET BY MOUTH ONCE A DAY 90 tablet 3   aspirin EC 81 MG tablet Take 1 tablet (81 mg total) by mouth daily. 30 tablet 0   dapagliflozin propanediol (FARXIGA) 10 MG TABS tablet Take 1 tablet (10 mg total) by mouth daily. 90 tablet 2   EPINEPHrine 0.3 mg/0.3 mL IJ SOAJ injection Inject 0.3 mg into the muscle as needed for anaphylaxis. 1 each 2   fluticasone (FLONASE) 50 MCG/ACT nasal spray Place 2 sprays into both nostrils daily.     lisinopril (ZESTRIL) 20 MG tablet Take 1 tablet (20 mg total) by mouth daily. 90 tablet 2   Multiple Vitamin (MULTIVITAMIN WITH MINERALS) TABS tablet Take 1 tablet by mouth daily.     No current facility-administered medications on file prior to visit.    Review of Systems  Constitutional:  Negative for fever.  Eyes:  Negative for visual disturbance.  Respiratory:  Negative for cough, shortness of breath and wheezing.   Cardiovascular:  Negative for chest pain, palpitations and leg swelling.  Gastrointestinal:  Negative for abdominal pain, blood in stool, constipation and diarrhea.       No gerd  Genitourinary:  Negative for difficulty urinating, dysuria and hematuria.  Musculoskeletal:  Positive for arthralgias and back pain.  Skin:  Negative for rash.  Neurological:  Positive for  dizziness (rare) and headaches (occ). Negative for light-headedness.  Psychiatric/Behavioral:  Negative for dysphoric mood. The patient is not nervous/anxious.        Objective:   Vitals:   05/25/23 0825  BP: 118/70  Pulse: 78  Temp: 97.8 F (36.6 C)  SpO2: 98%   Filed Weights   05/25/23 0825  Weight: 255 lb (115.7 kg)   Body mass index is 32.74 kg/m.  BP Readings from Last 3 Encounters:  05/25/23 118/70  04/27/23 120/70  11/22/22 120/70    Wt Readings from Last 3 Encounters:  05/25/23 255 lb (115.7 kg)  04/27/23 252 lb (114.3 kg)  11/22/22 248 lb (112.5 kg)      Physical Exam Constitutional: He appears well-developed and well-nourished. No distress.  HENT:  Head: Normocephalic and atraumatic.  Right Ear: External ear normal.  Left Ear: External ear normal.  Normal ear canals and TM b/l  Mouth/Throat: Oropharynx is clear and moist. Eyes: Conjunctivae and EOM are normal.  Neck: Neck supple. No tracheal deviation present. No thyromegaly present.  No carotid bruit  Cardiovascular: Normal rate, regular rhythm, normal heart sounds and intact distal pulses.   No murmur heard.  No lower extremity edema. Pulmonary/Chest: Effort normal and breath sounds normal. No respiratory distress. He has no wheezes. He has no rales.  Abdominal: Soft. He exhibits no distension. There  is no tenderness.  Genitourinary: deferred  Lymphadenopathy:   He has no cervical adenopathy.  Skin: Skin is warm and dry. He is not diaphoretic.  Psychiatric: He has a normal mood and affect. His behavior is normal.         Assessment & Plan:   Physical exam: Screening blood work  ordered Exercise   none now - will get to walking Weight  obese - encouraged weight loss Substance abuse   none   Reviewed recommended immunizations.   Health Maintenance  Topic Date Due   FOOT EXAM  01/14/2023   OPHTHALMOLOGY EXAM  01/28/2023   Diabetic kidney evaluation - Urine ACR  05/22/2023    HEMOGLOBIN A1C  05/24/2023   Colonoscopy  07/05/2023   COVID-19 Vaccine (1 - 2024-25 season) 06/10/2023 (Originally 02/06/2023)   INFLUENZA VACCINE  09/05/2023 (Originally 01/06/2023)   Diabetic kidney evaluation - eGFR measurement  11/22/2023   Medicare Annual Wellness (AWV)  04/26/2024   DTaP/Tdap/Td (2 - Td or Tdap) 12/05/2031   Pneumonia Vaccine 59+ Years old  Completed   Hepatitis C Screening  Completed   HPV VACCINES  Aged Out   Zoster Vaccines- Shingrix  Discontinued     See Problem List for Assessment and Plan of chronic medical problems.

## 2023-05-24 NOTE — Patient Instructions (Addendum)
Blood work was ordered.       Medications changes include :   None    A referral was ordered and someone will call you to schedule an appointment.     Return in about 6 months (around 11/23/2023) for follow up.   Health Maintenance, Male Adopting a healthy lifestyle and getting preventive care are important in promoting health and wellness. Ask your health care provider about: The right schedule for you to have regular tests and exams. Things you can do on your own to prevent diseases and keep yourself healthy. What should I know about diet, weight, and exercise? Eat a healthy diet  Eat a diet that includes plenty of vegetables, fruits, low-fat dairy products, and lean protein. Do not eat a lot of foods that are high in solid fats, added sugars, or sodium. Maintain a healthy weight Body mass index (BMI) is a measurement that can be used to identify possible weight problems. It estimates body fat based on height and weight. Your health care provider can help determine your BMI and help you achieve or maintain a healthy weight. Get regular exercise Get regular exercise. This is one of the most important things you can do for your health. Most adults should: Exercise for at least 150 minutes each week. The exercise should increase your heart rate and make you sweat (moderate-intensity exercise). Do strengthening exercises at least twice a week. This is in addition to the moderate-intensity exercise. Spend less time sitting. Even light physical activity can be beneficial. Watch cholesterol and blood lipids Have your blood tested for lipids and cholesterol at 70 years of age, then have this test every 5 years. You may need to have your cholesterol levels checked more often if: Your lipid or cholesterol levels are high. You are older than 70 years of age. You are at high risk for heart disease. What should I know about cancer screening? Many types of cancers can be detected  early and may often be prevented. Depending on your health history and family history, you may need to have cancer screening at various ages. This may include screening for: Colorectal cancer. Prostate cancer. Skin cancer. Lung cancer. What should I know about heart disease, diabetes, and high blood pressure? Blood pressure and heart disease High blood pressure causes heart disease and increases the risk of stroke. This is more likely to develop in people who have high blood pressure readings or are overweight. Talk with your health care provider about your target blood pressure readings. Have your blood pressure checked: Every 3-5 years if you are 67-12 years of age. Every year if you are 32 years old or older. If you are between the ages of 95 and 62 and are a current or former smoker, ask your health care provider if you should have a one-time screening for abdominal aortic aneurysm (AAA). Diabetes Have regular diabetes screenings. This checks your fasting blood sugar level. Have the screening done: Once every three years after age 44 if you are at a normal weight and have a low risk for diabetes. More often and at a younger age if you are overweight or have a high risk for diabetes. What should I know about preventing infection? Hepatitis B If you have a higher risk for hepatitis B, you should be screened for this virus. Talk with your health care provider to find out if you are at risk for hepatitis B infection. Hepatitis C Blood testing is recommended for:  Everyone born from [birth year] through 1963. Anyone with known risk factors for hepatitis C. Sexually transmitted infections (STIs) You should be screened each year for STIs, including gonorrhea and chlamydia, if: You are sexually active and are younger than 69 years of age. You are older than 70 years of age and your health care provider tells you that you are at risk for this type of infection. Your sexual activity has changed since  you were last screened, and you are at increased risk for chlamydia or gonorrhea. Ask your health care provider if you are at risk. Ask your health care provider about whether you are at high risk for HIV. Your health care provider may recommend a prescription medicine to help prevent HIV infection. If you choose to take medicine to prevent HIV, you should first get tested for HIV. You should then be tested every 3 months for as long as you are taking the medicine. Follow these instructions at home: Alcohol use Do not drink alcohol if your health care provider tells you not to drink. If you drink alcohol: Limit how much you have to 0-2 drinks a day. Know how much alcohol is in your drink. In the [place] one drink equals one 12 oz bottle of beer (355 mL), one 5 oz glass of wine (148 mL), or one 1 oz glass of hard liquor (44 mL). Lifestyle Do not use any products that contain nicotine or tobacco. These products include cigarettes, chewing tobacco, and vaping devices, such as e-cigarettes. If you need help quitting, ask your health care provider. Do not use street drugs. Do not share needles. Ask your health care provider for help if you need support or information about quitting drugs. General instructions Schedule regular health, dental, and eye exams. Stay current with your vaccines. Tell your health care provider if: You often feel depressed. You have ever been abused or do not feel safe at home. Summary Adopting a healthy lifestyle and getting preventive care are important in promoting health and wellness. Follow your health care provider's instructions about healthy diet, exercising, and getting tested or screened for diseases. Follow your health care provider's instructions on monitoring your cholesterol and blood pressure. This information is not intended to replace advice given to you by your health care provider. Make sure you discuss any questions you have with your health care  provider. Document Revised: 10/13/2020 Document Reviewed: 10/13/2020 Elsevier Patient Education  2024 [employer].

## 2023-05-25 ENCOUNTER — Ambulatory Visit: Payer: PPO | Admitting: Internal Medicine

## 2023-05-25 VITALS — BP 118/70 | HR 78 | Temp 97.8°F | Ht 74.0 in | Wt 255.0 lb

## 2023-05-25 DIAGNOSIS — M609 Myositis, unspecified: Secondary | ICD-10-CM | POA: Diagnosis not present

## 2023-05-25 DIAGNOSIS — Z0000 Encounter for general adult medical examination without abnormal findings: Secondary | ICD-10-CM

## 2023-05-25 DIAGNOSIS — E1169 Type 2 diabetes mellitus with other specified complication: Secondary | ICD-10-CM | POA: Diagnosis not present

## 2023-05-25 DIAGNOSIS — E66811 Obesity, class 1: Secondary | ICD-10-CM | POA: Diagnosis not present

## 2023-05-25 DIAGNOSIS — Z7984 Long term (current) use of oral hypoglycemic drugs: Secondary | ICD-10-CM

## 2023-05-25 DIAGNOSIS — T466X5A Adverse effect of antihyperlipidemic and antiarteriosclerotic drugs, initial encounter: Secondary | ICD-10-CM

## 2023-05-25 DIAGNOSIS — Z8673 Personal history of transient ischemic attack (TIA), and cerebral infarction without residual deficits: Secondary | ICD-10-CM | POA: Diagnosis not present

## 2023-05-25 DIAGNOSIS — I2510 Atherosclerotic heart disease of native coronary artery without angina pectoris: Secondary | ICD-10-CM

## 2023-05-25 DIAGNOSIS — E7849 Other hyperlipidemia: Secondary | ICD-10-CM | POA: Diagnosis not present

## 2023-05-25 DIAGNOSIS — Z125 Encounter for screening for malignant neoplasm of prostate: Secondary | ICD-10-CM | POA: Diagnosis not present

## 2023-05-25 DIAGNOSIS — I10 Essential (primary) hypertension: Secondary | ICD-10-CM

## 2023-05-25 LAB — COMPREHENSIVE METABOLIC PANEL
ALT: 24 U/L (ref 0–53)
AST: 18 U/L (ref 0–37)
Albumin: 4.4 g/dL (ref 3.5–5.2)
Alkaline Phosphatase: 62 U/L (ref 39–117)
BUN: 14 mg/dL (ref 6–23)
CO2: 26 meq/L (ref 19–32)
Calcium: 9.6 mg/dL (ref 8.4–10.5)
Chloride: 103 meq/L (ref 96–112)
Creatinine, Ser: 0.79 mg/dL (ref 0.40–1.50)
GFR: 90.17 mL/min (ref >60.00)
Glucose, Bld: 113 mg/dL — ABNORMAL HIGH (ref 70–99)
Potassium: 4.6 meq/L (ref 3.5–5.1)
Sodium: 137 meq/L (ref 135–145)
Total Bilirubin: 1.1 mg/dL (ref 0.2–1.2)
Total Protein: 7.1 g/dL (ref 6.0–8.3)

## 2023-05-25 LAB — CBC WITH DIFFERENTIAL/PLATELET
Basophils Absolute: 0 10*3/uL (ref 0.0–0.1)
Basophils Relative: 0.4 % (ref 0.0–3.0)
Eosinophils Absolute: 0.1 10*3/uL (ref 0.0–0.7)
Eosinophils Relative: 1.6 % (ref 0.0–5.0)
HCT: 46 % (ref 39.0–52.0)
Hemoglobin: 15.8 g/dL (ref 13.0–17.0)
Lymphocytes Relative: 44.2 % (ref 12.0–46.0)
Lymphs Abs: 2.7 10*3/uL (ref 0.7–4.0)
MCHC: 34.3 g/dL (ref 30.0–36.0)
MCV: 94.9 fL (ref 78.0–100.0)
Monocytes Absolute: 0.6 10*3/uL (ref 0.1–1.0)
Monocytes Relative: 10.1 % (ref 3.0–12.0)
Neutro Abs: 2.6 10*3/uL (ref 1.4–7.7)
Neutrophils Relative %: 43.7 % (ref 43.0–77.0)
Platelets: 252 10*3/uL (ref 150.0–400.0)
RBC: 4.85 Mil/uL (ref 4.22–5.81)
RDW: 13.5 % (ref 11.5–15.5)
WBC: 6 10*3/uL (ref 4.0–10.5)

## 2023-05-25 LAB — HEMOGLOBIN A1C: Hgb A1c MFr Bld: 5.8 % (ref 4.6–6.5)

## 2023-05-25 LAB — LIPID PANEL
Cholesterol: 156 mg/dL (ref 0–200)
HDL: 39.9 mg/dL (ref >39.00)
LDL Cholesterol: 95 mg/dL (ref 0–99)
NonHDL: 116.13
Total CHOL/HDL Ratio: 4
Triglycerides: 104 mg/dL (ref 0.0–149.0)
VLDL: 20.8 mg/dL (ref 0.0–40.0)

## 2023-05-25 LAB — MICROALBUMIN / CREATININE URINE RATIO
Creatinine,U: 80.3 mg/dL
Microalb Creat Ratio: 0.9 mg/g (ref 0.0–30.0)
Microalb, Ur: <0.7 mg/dL (ref 0.0–1.9)

## 2023-05-25 LAB — PSA, MEDICARE: PSA: 1.36 ng/mL (ref 0.10–4.00)

## 2023-05-25 LAB — TSH: TSH: 0.9 u[IU]/mL (ref 0.35–5.50)

## 2023-05-25 NOTE — Assessment & Plan Note (Signed)
did not tolerate Crestor Does not want to try another statin Discussed other options, but will avoid statins

## 2023-05-25 NOTE — Assessment & Plan Note (Addendum)
Chronic No symptoms consistent with angina Continue aspirin 81 mg daily Has deferred cholesterol medication in the past Check lipid panel today

## 2023-05-25 NOTE — Assessment & Plan Note (Signed)
Chronic BMI more than 31.84 kg/m with diabetes, hyperlipidemia, hypertension Has lost weight - Will continue his weight loss efforts Stressed regular exercise, decrease portions, healthy diet

## 2023-05-25 NOTE — Assessment & Plan Note (Addendum)
History of TIA Continue aspirin 81 mg daily Did not tolerate Crestor Would prefer not to take any medication for cholesterol-check lipids Stressed good sugar control Blood pressure controlled

## 2023-05-25 NOTE — Assessment & Plan Note (Signed)
Chronic Blood pressure very well-controlled CMP, cbc Continue amlodipine 5 mg daily, lisinopril 20 mg daily

## 2023-05-25 NOTE — Assessment & Plan Note (Addendum)
Chronic Did not tolerate Crestor-joint pain, achiness Discussed increased risk of stroke, CAD Regular exercise and healthy diet encouraged Check lipid panel

## 2023-05-25 NOTE — Assessment & Plan Note (Signed)
Chronic  Lab Results  Component Value Date   HGBA1C 5.5 11/22/2022   Sugars well controlled Check A1c, urine microalbumin today Continue Farxiga 5 mg dai Stressed regular exercise, diabetic diet

## 2023-05-27 ENCOUNTER — Encounter: Payer: Self-pay | Admitting: Internal Medicine

## 2023-05-27 NOTE — Telephone Encounter (Signed)
 Care team updated and letter sent for eye exam notes.

## 2023-07-04 ENCOUNTER — Other Ambulatory Visit: Payer: Self-pay | Admitting: Internal Medicine

## 2023-08-05 ENCOUNTER — Other Ambulatory Visit: Payer: Self-pay | Admitting: Internal Medicine

## 2023-11-22 ENCOUNTER — Encounter: Payer: Self-pay | Admitting: Internal Medicine

## 2023-11-22 DIAGNOSIS — T466X5A Adverse effect of antihyperlipidemic and antiarteriosclerotic drugs, initial encounter: Secondary | ICD-10-CM | POA: Insufficient documentation

## 2023-11-22 NOTE — Patient Instructions (Addendum)
      Blood work was ordered.       Medications changes include :   None   Have an eye appointment.     Return in about 6 months (around 05/24/2024) for Physical Exam.

## 2023-11-22 NOTE — Progress Notes (Signed)
 Subjective:    Patient ID: Sean Mullins, male    DOB: 22-Oct-1950, 73 y.o.   MRN: [id number]     HPI Sean Mullins is here for follow up of his chronic medical problems.  No concerns  Does not want another colonoscopy  He is not exercising.  He has gained some weight back.   Medications and allergies reviewed with patient and updated if appropriate.  Current Outpatient Medications on File Prior to Visit  Medication Sig Dispense Refill   amLODipine  (NORVASC ) 5 MG tablet TAKE ONE TABLET BY MOUTH ONCE A DAY 90 tablet 3   aspirin  EC 81 MG tablet Take 1 tablet (81 mg total) by mouth daily. 30 tablet 0   dapagliflozin  propanediol (FARXIGA ) 10 MG TABS tablet Take 1 tablet (10 mg total) by mouth daily. 90 tablet 2   EPINEPHrine  0.3 mg/0.3 mL IJ SOAJ injection Inject 0.3 mg into the muscle as needed for anaphylaxis. 1 each 2   fluticasone (FLONASE) 50 MCG/ACT nasal spray Place 2 sprays into both nostrils daily.     lisinopril  (ZESTRIL ) 20 MG tablet TAKE ONE TABLET (20 MG TOTAL) BY MOUTH DAILY. 90 tablet 2   Multiple Vitamin (MULTIVITAMIN WITH MINERALS) TABS tablet Take 1 tablet by mouth daily.     No current facility-administered medications on file prior to visit.     Review of Systems  Constitutional:  Negative for fever.  Respiratory:  Negative for cough, shortness of breath and wheezing.   Cardiovascular:  Negative for chest pain, palpitations and leg swelling.  Neurological:  Negative for light-headedness and headaches.       Objective:   Vitals:   11/23/23 0839  BP: 126/74  Pulse: 60  Temp: 98.4 F (36.9 C)  SpO2: 98%   BP Readings from Last 3 Encounters:  11/23/23 126/74  05/25/23 118/70  04/27/23 120/70   Wt Readings from Last 3 Encounters:  11/23/23 263 lb (119.3 kg)  05/25/23 255 lb (115.7 kg)  04/27/23 252 lb (114.3 kg)   Body mass index is 33.77 kg/m.    Physical Exam Constitutional:      General: He is not in acute distress.    Appearance:  Normal appearance. He is not ill-appearing.  HENT:     Head: Normocephalic and atraumatic.   Eyes:     Conjunctiva/sclera: Conjunctivae normal.    Cardiovascular:     Rate and Rhythm: Normal rate and regular rhythm.     Heart sounds: Normal heart sounds.  Pulmonary:     Effort: Pulmonary effort is normal. No respiratory distress.     Breath sounds: Normal breath sounds. No wheezing or rales.   Musculoskeletal:     Right lower leg: No edema.     Left lower leg: No edema.   Skin:    General: Skin is warm and dry.     Findings: No rash.   Neurological:     Mental Status: He is alert. Mental status is at baseline.   Psychiatric:        Mood and Affect: Mood normal.       Diabetic Foot Exam - Simple   Simple Foot Form Diabetic Foot exam was performed with the following findings: Yes 11/23/2023  9:04 AM  Visual Inspection No deformities, no ulcerations, no other skin breakdown bilaterally: Yes Sensation Testing Intact to touch and monofilament testing bilaterally: Yes Pulse Check Posterior Tibialis and Dorsalis pulse intact bilaterally: Yes Comments      Lab Results  Component Value Date   WBC 6.0 05/25/2023   HGB 15.8 05/25/2023   HCT 46.0 05/25/2023   PLT 252.0 05/25/2023   GLUCOSE 113 (H) 05/25/2023   CHOL 156 05/25/2023   TRIG 104.0 05/25/2023   HDL 39.90 05/25/2023   LDLCALC 95 05/25/2023   ALT 24 05/25/2023   AST 18 05/25/2023   NA 137 05/25/2023   K 4.6 05/25/2023   CL 103 05/25/2023   CREATININE 0.79 05/25/2023   BUN 14 05/25/2023   CO2 26 05/25/2023   TSH 0.90 05/25/2023   PSA 1.36 05/25/2023   INR 1.0 05/06/2020   HGBA1C 5.8 05/25/2023   MICROALBUR <0.7 05/25/2023     Assessment & Plan:    See Problem List for Assessment and Plan of chronic medical problems.

## 2023-11-23 ENCOUNTER — Other Ambulatory Visit: Payer: Self-pay | Admitting: Internal Medicine

## 2023-11-23 ENCOUNTER — Ambulatory Visit: Payer: Self-pay | Admitting: Internal Medicine

## 2023-11-23 ENCOUNTER — Ambulatory Visit (INDEPENDENT_AMBULATORY_CARE_PROVIDER_SITE_OTHER): Payer: PPO | Admitting: Internal Medicine

## 2023-11-23 VITALS — BP 126/74 | HR 60 | Temp 98.4°F | Ht 74.0 in | Wt 263.0 lb

## 2023-11-23 DIAGNOSIS — E1169 Type 2 diabetes mellitus with other specified complication: Secondary | ICD-10-CM | POA: Diagnosis not present

## 2023-11-23 DIAGNOSIS — E7849 Other hyperlipidemia: Secondary | ICD-10-CM | POA: Diagnosis not present

## 2023-11-23 DIAGNOSIS — I10 Essential (primary) hypertension: Secondary | ICD-10-CM

## 2023-11-23 DIAGNOSIS — G720 Drug-induced myopathy: Secondary | ICD-10-CM

## 2023-11-23 DIAGNOSIS — T466X5A Adverse effect of antihyperlipidemic and antiarteriosclerotic drugs, initial encounter: Secondary | ICD-10-CM | POA: Diagnosis not present

## 2023-11-23 DIAGNOSIS — Z8673 Personal history of transient ischemic attack (TIA), and cerebral infarction without residual deficits: Secondary | ICD-10-CM | POA: Diagnosis not present

## 2023-11-23 DIAGNOSIS — I2510 Atherosclerotic heart disease of native coronary artery without angina pectoris: Secondary | ICD-10-CM | POA: Diagnosis not present

## 2023-11-23 LAB — COMPREHENSIVE METABOLIC PANEL WITH GFR
ALT: 32 U/L (ref 0–53)
AST: 21 U/L (ref 0–37)
Albumin: 4.4 g/dL (ref 3.5–5.2)
Alkaline Phosphatase: 52 U/L (ref 39–117)
BUN: 12 mg/dL (ref 6–23)
CO2: 26 meq/L (ref 19–32)
Calcium: 9.8 mg/dL (ref 8.4–10.5)
Chloride: 104 meq/L (ref 96–112)
Creatinine, Ser: 0.8 mg/dL (ref 0.40–1.50)
GFR: 89.51 mL/min (ref >60.00)
Glucose, Bld: 122 mg/dL — ABNORMAL HIGH (ref 70–99)
Potassium: 4.2 meq/L (ref 3.5–5.1)
Sodium: 137 meq/L (ref 135–145)
Total Bilirubin: 1.3 mg/dL — ABNORMAL HIGH (ref 0.2–1.2)
Total Protein: 7.3 g/dL (ref 6.0–8.3)

## 2023-11-23 LAB — LIPID PANEL
Cholesterol: 167 mg/dL (ref 0–200)
HDL: 38.7 mg/dL — ABNORMAL LOW (ref >39.00)
LDL Cholesterol: 102 mg/dL — ABNORMAL HIGH (ref 0–99)
NonHDL: 128.26
Total CHOL/HDL Ratio: 4
Triglycerides: 132 mg/dL (ref 0.0–149.0)
VLDL: 26.4 mg/dL (ref 0.0–40.0)

## 2023-11-23 LAB — HEMOGLOBIN A1C: Hgb A1c MFr Bld: 5.8 % (ref 4.6–6.5)

## 2023-11-23 MED ORDER — EPINEPHRINE 0.3 MG/0.3ML IJ SOAJ: 0.3000 mg | INTRAMUSCULAR | 2 refills | Status: AC | PRN

## 2023-11-23 NOTE — Assessment & Plan Note (Signed)
 Chronic No symptoms consistent with angina Continue aspirin 81 mg daily Has deferred cholesterol medication in the past Check lipid panel today

## 2023-11-23 NOTE — Assessment & Plan Note (Signed)
 Chronic  Lab Results  Component Value Date   HGBA1C 5.8 05/25/2023   Sugars well controlled Check A1c Continue Farxiga  10 mg daily Stressed regular exercise, diabetic diet

## 2023-11-23 NOTE — Assessment & Plan Note (Signed)
 History of TIA Continue aspirin 81 mg daily Did not tolerate Crestor Would prefer not to take any medication for cholesterol-check lipids Stressed good sugar control Blood pressure controlled

## 2023-11-23 NOTE — Assessment & Plan Note (Signed)
Chronic Blood pressure very well-controlled CMP Continue amlodipine 5 mg daily, lisinopril 20 mg daily

## 2023-11-23 NOTE — Assessment & Plan Note (Addendum)
 Chronic Did not tolerate Crestor  secondary to myalgias, arthralgias Deferred trying another statin

## 2023-11-23 NOTE — Assessment & Plan Note (Signed)
 Chronic Did not tolerate Crestor-joint pain, achiness Discussed increased risk of stroke, CAD Regular exercise and healthy diet encouraged Check lipid panel

## 2023-12-21 ENCOUNTER — Encounter: Payer: Self-pay | Admitting: Internal Medicine

## 2023-12-21 DIAGNOSIS — Z961 Presence of intraocular lens: Secondary | ICD-10-CM | POA: Diagnosis not present

## 2023-12-21 DIAGNOSIS — Z7984 Long term (current) use of oral hypoglycemic drugs: Secondary | ICD-10-CM | POA: Diagnosis not present

## 2023-12-21 DIAGNOSIS — H524 Presbyopia: Secondary | ICD-10-CM | POA: Diagnosis not present

## 2023-12-21 DIAGNOSIS — H52223 Regular astigmatism, bilateral: Secondary | ICD-10-CM | POA: Diagnosis not present

## 2023-12-21 DIAGNOSIS — H01003 Unspecified blepharitis right eye, unspecified eyelid: Secondary | ICD-10-CM | POA: Diagnosis not present

## 2023-12-21 DIAGNOSIS — E119 Type 2 diabetes mellitus without complications: Secondary | ICD-10-CM | POA: Diagnosis not present

## 2023-12-21 DIAGNOSIS — H5203 Hypermetropia, bilateral: Secondary | ICD-10-CM | POA: Diagnosis not present

## 2023-12-21 DIAGNOSIS — H01006 Unspecified blepharitis left eye, unspecified eyelid: Secondary | ICD-10-CM | POA: Diagnosis not present

## 2024-02-16 ENCOUNTER — Emergency Department

## 2024-02-16 ENCOUNTER — Emergency Department
Admission: EM | Admit: 2024-02-16 | Discharge: 2024-02-16 | Disposition: A | Discharge status: Home / Self Care | Attending: Emergency Medicine | Admitting: Emergency Medicine

## 2024-02-16 ENCOUNTER — Encounter: Payer: Self-pay | Admitting: Emergency Medicine

## 2024-02-16 ENCOUNTER — Other Ambulatory Visit: Payer: Self-pay

## 2024-02-16 DIAGNOSIS — R42 Dizziness and giddiness: Secondary | ICD-10-CM | POA: Diagnosis not present

## 2024-02-16 DIAGNOSIS — J9811 Atelectasis: Secondary | ICD-10-CM | POA: Diagnosis not present

## 2024-02-16 DIAGNOSIS — R079 Chest pain, unspecified: Secondary | ICD-10-CM | POA: Diagnosis not present

## 2024-02-16 DIAGNOSIS — I6523 Occlusion and stenosis of bilateral carotid arteries: Secondary | ICD-10-CM | POA: Diagnosis not present

## 2024-02-16 DIAGNOSIS — R519 Headache, unspecified: Secondary | ICD-10-CM | POA: Diagnosis not present

## 2024-02-16 DIAGNOSIS — R29818 Other symptoms and signs involving the nervous system: Secondary | ICD-10-CM | POA: Diagnosis not present

## 2024-02-16 LAB — BASIC METABOLIC PANEL WITH GFR
Anion gap: 13 (ref 5–15)
BUN: 13 mg/dL (ref 8–23)
CO2: 21 mmol/L — ABNORMAL LOW (ref 22–32)
Calcium: 9.6 mg/dL (ref 8.9–10.3)
Chloride: 103 mmol/L (ref 98–111)
Creatinine, Ser: 0.65 mg/dL (ref 0.61–1.24)
GFR, Estimated: >60 mL/min (ref >60)
Glucose, Bld: 110 mg/dL — ABNORMAL HIGH (ref 70–99)
Potassium: 4 mmol/L (ref 3.5–5.1)
Sodium: 137 mmol/L (ref 135–145)

## 2024-02-16 LAB — CBC
HCT: 46.6 % (ref 39.0–52.0)
Hemoglobin: 16.5 g/dL (ref 13.0–17.0)
MCH: 31.9 pg (ref 26.0–34.0)
MCHC: 35.4 g/dL (ref 30.0–36.0)
MCV: 90 fL (ref 80.0–100.0)
Platelets: 238 K/uL (ref 150–400)
RBC: 5.18 MIL/uL (ref 4.22–5.81)
RDW: 12.8 % (ref 11.5–15.5)
WBC: 6.3 K/uL (ref 4.0–10.5)
nRBC: 0 % (ref 0.0–0.2)

## 2024-02-16 LAB — PROTIME-INR
INR: 1 (ref 0.8–1.2)
Prothrombin Time: 13.3 s (ref 11.4–15.2)

## 2024-02-16 LAB — HEPATIC FUNCTION PANEL
ALT: 45 U/L — ABNORMAL HIGH (ref 0–44)
AST: 31 U/L (ref 15–41)
Albumin: 4.1 g/dL (ref 3.5–5.0)
Alkaline Phosphatase: 58 U/L (ref 38–126)
Bilirubin, Direct: 0.1 mg/dL (ref 0.0–0.2)
Indirect Bilirubin: 1.4 mg/dL — ABNORMAL HIGH (ref 0.3–0.9)
Total Bilirubin: 1.5 mg/dL — ABNORMAL HIGH (ref 0.0–1.2)
Total Protein: 7.7 g/dL (ref 6.5–8.1)

## 2024-02-16 LAB — TROPONIN I (HIGH SENSITIVITY)
Troponin I (High Sensitivity): 3 ng/L (ref <18)
Troponin I (High Sensitivity): 4 ng/L (ref <18)

## 2024-02-16 LAB — LIPASE, BLOOD: Lipase: 29 U/L (ref 11–51)

## 2024-02-16 MED ORDER — MIDAZOLAM HCL 2 MG/2ML IJ SOLN
1.0000 mg | Freq: Once | INTRAMUSCULAR | Status: AC
  Administered 2024-02-16: 1 mg via INTRAVENOUS
  Filled 2024-02-16: qty 2

## 2024-02-16 MED ORDER — MECLIZINE HCL 25 MG PO TABS: 25.0000 mg | ORAL_TABLET | Freq: Three times a day (TID) | ORAL | 0 refills | Status: AC | PRN

## 2024-02-16 MED ORDER — SODIUM CHLORIDE 0.9 % IV BOLUS
500.0000 mL | Freq: Once | INTRAVENOUS | Status: AC
  Administered 2024-02-16: 500 mL via INTRAVENOUS

## 2024-02-16 MED ORDER — METOCLOPRAMIDE HCL 5 MG/ML IJ SOLN
10.0000 mg | Freq: Once | INTRAMUSCULAR | Status: AC
  Administered 2024-02-16: 10 mg via INTRAVENOUS
  Filled 2024-02-16: qty 2

## 2024-02-16 MED ORDER — IOHEXOL 350 MG/ML SOLN
75.0000 mL | Freq: Once | INTRAVENOUS | Status: AC | PRN
  Administered 2024-02-16: 75 mL via INTRAVENOUS

## 2024-02-16 MED ORDER — ONDANSETRON 4 MG PO TBDP: 4.0000 mg | ORAL_TABLET | Freq: Three times a day (TID) | ORAL | 0 refills | Status: AC | PRN

## 2024-02-16 NOTE — ED Provider Notes (Signed)
-----------------------------------------   6:43 PM on 02/16/2024 ----------------------------------------- Patient care assumed from Dr. Ernest.  Patient's workup shows a reassuring MRI and CTA with no acute findings on either.  SPECT his dizziness could be caused more by vertigo than any central nervous system issue.  Given the patient's otherwise reassuring workup we will discharge home on meclizine  and Zofran  and have the patient follow-up with his doctor for further evaluation.  Patient agreeable to plan of care.   Dorothyann Drivers, MD 02/16/24 [phone]

## 2024-02-16 NOTE — ED Provider Notes (Signed)
 [facility] Provider Note    Event Date/Time   First MD Initiated Contact with Patient 02/16/24 1424     (approximate)   History   Dizziness   HPI  Sean Mullins is a 73 y.o. male with history of vertigo, prior strokes who comes in with concerns for dizziness.  Patient reportedly felt normal last night around midnight.  He stated he woke up this morning with some difficulties with ambulation but then he reports a significant change when he was at the dentist chair and he was leaning backwards he started feeling dizzy.  He took some meclizine  but reported that he then developed some intermittent  pulsating chest pain pulsations in his eyes, shortness of breath.  He currently denies any shortness of breath, leg swelling.  He reports continued to have some intermittent pain in his chest but nothing that is been consistent.  He denies any abdominal pain.  He still reports that he has to have the lights turned off as the light makes him feel like the room is spinning.  Reports that this is not how he typically feels when he gets vertigo.  Physical Exam   Triage Vital Signs: ED Triage Vitals [02/16/24 1200]  Encounter Vitals Group     BP (!) 149/77     Girls Systolic BP Percentile      Girls Diastolic BP Percentile      Boys Systolic BP Percentile      Boys Diastolic BP Percentile      Pulse Rate 65     Resp 18     Temp 98.3 F (36.8 C)     Temp Source Oral     SpO2 96 %     Weight 262 lb (118.8 kg)     Height 6' 4 (1.93 m)     Head Circumference      Peak Flow      Pain Score 2     Pain Loc      Pain Education      Exclude from Growth Chart     Most recent vital signs: Vitals:   02/16/24 1430 02/16/24 1500  BP: 124/78 127/63  Pulse: 61 (!) 57  Resp: 19 17  Temp:    SpO2: 98% 98%     General: Awake, no distress.  CV:  Good peripheral perfusion.  Resp:  Normal effort.  Abd:  No distention.  Soft nontender Other:  Cranial nerves appear  intact equal strength in arms and legs.  Finger-to-nose intact   ED Results / Procedures / Treatments   Labs (all labs ordered are listed, but only abnormal results are displayed) Labs Reviewed  BASIC METABOLIC PANEL WITH GFR - Abnormal; Notable for the following components:      Result Value   CO2 21 (*)    Glucose, Bld 110 (*)    All other components within normal limits  CBC  PROTIME-INR  TROPONIN I (HIGH SENSITIVITY)  TROPONIN I (HIGH SENSITIVITY)     EKG  My interpretation of EKG:  Normal sinus rate of 60 without any ST elevation no T wave inversions, normal intervals  RADIOLOGY I have reviewed the ct personally and interpreted no intracranial hemorrhage   PROCEDURES:  Critical Care performed: No  .1-3 Lead EKG Interpretation  Performed by: Ernest Ronal BRAVO, MD Authorized by: Ernest Ronal BRAVO, MD     Interpretation: normal     ECG rate:  60   ECG rate assessment: normal  Rhythm: sinus rhythm     Ectopy: none     Conduction: normal      MEDICATIONS ORDERED IN ED: Medications  midazolam  (VERSED ) injection 1 mg (has no administration in time range)  metoCLOPramide  (REGLAN ) injection 10 mg (10 mg Intravenous Given 02/16/24 1451)  sodium chloride  0.9 % bolus 500 mL (500 mLs Intravenous New Bag/Given 02/16/24 1450)  iohexol  (OMNIPAQUE ) 350 MG/ML injection 75 mL (75 mLs Intravenous Contrast Given 02/16/24 1539)     IMPRESSION / MDM / ASSESSMENT AND PLAN / ED COURSE  I reviewed the triage vital signs and the nursing notes.   Patient's presentation is most consistent with acute presentation with potential threat to life or bodily function.   Patient comes in with multiple symptoms I suspect that this is likely related to vertigo but he states that this feels different and he is concerned about the possibility of stroke.  We will proceed with CT imaging to rule out any type of intracranial hemorrhage, dissection, as well as MRI to rule out stroke.  I will treat  patient with some Reglan , fluids.  Patient was also ordered some Versed  to help facilitate MRI.  Cardiac workup was ordered evaluate for ACS he denies any shortness of breath at this time to suggest PE.  No chest pain at this time as he reports it is all resolved so seems less likely dissection.  Troponins are negative x 2.  BMP reassuring.  CBC reassuring   Will add on hfp, lipase but abdominal exam was benign.   Patient handed off to oncoming team pending CT imaging, MRI and the rest of blood work and reevaluation after medications  The patient is on the cardiac monitor to evaluate for evidence of arrhythmia and/or significant heart rate changes.      FINAL CLINICAL IMPRESSION(S) / ED DIAGNOSES   Final diagnoses:  Dizziness     Rx / DC Orders   ED Discharge Orders     None        Note:  This document was prepared using Dragon voice recognition software and may include unintentional dictation errors.   Ernest Ronal BRAVO, MD 02/16/24 [phone]

## 2024-02-16 NOTE — ED Triage Notes (Addendum)
 Patient to ED via POV for dizziness. Started this AM. States worse when laying back in the dentist chair. HX vertigo- took PRN meds for same. Now having CP, SOB, anxiety, and pulsation in eyes.

## 2024-02-19 NOTE — Assessment & Plan Note (Addendum)
 Chronic History of BPPV Recent episode of dizziness unlike his other episodes Evaluated in ED-workup reassuring-cardiac cause ruled out, MRI brain, CT angio head and neck ruled out central nervous system cause Taking meclizine  and Zofran  as needed Dizziness has improved and is mild and transient Continue meclizine  25 mg tid prn and zofran  as needed Follow up if vertigo does not continue to improve/resolve

## 2024-02-19 NOTE — Progress Notes (Signed)
 Subjective:    Patient ID: Sean Mullins, male    DOB: 1950/06/14, 73 y.o.   MRN: [id number]     HPI Sean Mullins is here for follow up from hospital.   ED 9/11 for dizziness.  He woke up that morning with difficulty ambulating.  Went to the dentist that day and when he was leaning backwards in the chair he started to feel dizziness.  We took meclizine .  He started experiencing intermittent pulsating chest pain and pulsations in his eyes.  He denies shortness of breath.  He has a history of strokes and vertigo.  This felt different from his previous episodes of vertigo.  BP 149/77-improved upon rechecking.  He received Versed , Reglan , IVF.  Troponin x 2 negative, BMP, CBC without concerns.  EKG okay.  Chest x-ray with mild left basilar atelectasis.  CT angio head and neck without acute intracranial hemorrhage, no large vessel occlusion, aneurysm or hemodynamically significant stenosis.  Atherosclerotic calcifications in right carotid bifurcation, proximal ICA and left carotid bifurcation, proximal ICA without significant stenosis.SABRA  MRI brain without acute intracranial abnormality.  Dizziness thought to be vertigo-central nervous system cause ruled out.  Discharged home on meclizine , Zofran   90 % dizziness is gone.  If he turns quick he will still feel a little dizziness transiently.  It is better and tolerable.   Medications and allergies reviewed with patient and updated if appropriate.  Current Outpatient Medications on File Prior to Visit  Medication Sig Dispense Refill   amLODipine  (NORVASC ) 5 MG tablet TAKE ONE TABLET BY MOUTH ONCE A DAY 90 tablet 3   aspirin  EC 81 MG tablet Take 1 tablet (81 mg total) by mouth daily. 30 tablet 0   EPINEPHrine  0.3 mg/0.3 mL IJ SOAJ injection Inject 0.3 mg into the muscle as needed for anaphylaxis. 1 each 2   FARXIGA  10 MG TABS tablet TAKE ONE TABLET (10 MG TOTAL) BY MOUTH DAILY. 90 tablet 2   fluticasone (FLONASE) 50 MCG/ACT nasal spray Place 2  sprays into both nostrils daily.     lisinopril  (ZESTRIL ) 20 MG tablet TAKE ONE TABLET (20 MG TOTAL) BY MOUTH DAILY. 90 tablet 2   meclizine  (ANTIVERT ) 25 MG tablet Take 1 tablet (25 mg total) by mouth 3 (three) times daily as needed. 30 tablet 0   Multiple Vitamin (MULTIVITAMIN WITH MINERALS) TABS tablet Take 1 tablet by mouth daily.     ondansetron  (ZOFRAN -ODT) 4 MG disintegrating tablet Take 1 tablet (4 mg total) by mouth every 8 (eight) hours as needed for nausea or vomiting. 20 tablet 0   No current facility-administered medications on file prior to visit.     Review of Systems  Constitutional:  Negative for fever.  HENT:  Negative for congestion, ear pain, sinus pressure and sore throat.   Respiratory:  Negative for shortness of breath.   Cardiovascular:  Negative for chest pain and palpitations.  Neurological:  Positive for dizziness. Negative for weakness, numbness and headaches.       Objective:   Vitals:   02/20/24 0924  BP: 124/68  Pulse: 68  Temp: 97.7 F (36.5 C)  SpO2: 98%   BP Readings from Last 3 Encounters:  02/20/24 124/68  02/16/24 138/69  11/23/23 126/74   Wt Readings from Last 3 Encounters:  02/20/24 268 lb (121.6 kg)  02/16/24 262 lb (118.8 kg)  11/23/23 263 lb (119.3 kg)   Body mass index is 32.62 kg/m.    Physical Exam Constitutional:  General: He is not in acute distress.    Appearance: Normal appearance. He is not ill-appearing.  HENT:     Head: Normocephalic and atraumatic.     Right Ear: Tympanic membrane, ear canal and external ear normal. There is no impacted cerumen.     Left Ear: Tympanic membrane, ear canal and external ear normal. There is no impacted cerumen.     Mouth/Throat:     Mouth: Mucous membranes are moist.     Pharynx: No posterior oropharyngeal erythema.  Eyes:     Conjunctiva/sclera: Conjunctivae normal.  Cardiovascular:     Rate and Rhythm: Normal rate and regular rhythm.     Heart sounds: Normal heart  sounds.  Pulmonary:     Effort: Pulmonary effort is normal. No respiratory distress.     Breath sounds: Normal breath sounds. No wheezing or rales.  Musculoskeletal:     Right lower leg: No edema.     Left lower leg: No edema.  Skin:    General: Skin is warm and dry.     Findings: No rash.  Neurological:     Mental Status: He is alert. Mental status is at baseline.  Psychiatric:        Mood and Affect: Mood normal.        Lab Results  Component Value Date   WBC 6.3 02/16/2024   HGB 16.5 02/16/2024   HCT 46.6 02/16/2024   PLT 238 02/16/2024   GLUCOSE 110 (H) 02/16/2024   CHOL 167 11/23/2023   TRIG 132.0 11/23/2023   HDL 38.70 (L) 11/23/2023   LDLCALC 102 (H) 11/23/2023   ALT 45 (H) 02/16/2024   AST 31 02/16/2024   NA 137 02/16/2024   K 4.0 02/16/2024   CL 103 02/16/2024   CREATININE 0.65 02/16/2024   BUN 13 02/16/2024   CO2 21 (L) 02/16/2024   TSH 0.90 05/25/2023   PSA 1.36 05/25/2023   INR 1.0 02/16/2024   HGBA1C 5.8 11/23/2023     Assessment & Plan:    See Problem List for Assessment and Plan of chronic medical problems.

## 2024-02-20 ENCOUNTER — Ambulatory Visit (INDEPENDENT_AMBULATORY_CARE_PROVIDER_SITE_OTHER): Admitting: Internal Medicine

## 2024-02-20 ENCOUNTER — Encounter: Payer: Self-pay | Admitting: Internal Medicine

## 2024-02-20 VITALS — BP 124/68 | HR 68 | Temp 97.7°F | Ht 76.0 in | Wt 268.0 lb

## 2024-02-20 DIAGNOSIS — H8110 Benign paroxysmal vertigo, unspecified ear: Secondary | ICD-10-CM

## 2024-02-20 DIAGNOSIS — I10 Essential (primary) hypertension: Secondary | ICD-10-CM | POA: Diagnosis not present

## 2024-02-20 NOTE — Assessment & Plan Note (Signed)
Chronic Blood pressure very well-controlled Continue amlodipine 5 mg daily, lisinopril 20 mg daily

## 2024-02-20 NOTE — Patient Instructions (Addendum)
       Medications changes include :   None    Return in about 3 months (around 05/21/2024) for follow up.

## 2024-02-23 DIAGNOSIS — H01001 Unspecified blepharitis right upper eyelid: Secondary | ICD-10-CM | POA: Diagnosis not present

## 2024-02-23 DIAGNOSIS — H04123 Dry eye syndrome of bilateral lacrimal glands: Secondary | ICD-10-CM | POA: Diagnosis not present

## 2024-02-23 DIAGNOSIS — H01004 Unspecified blepharitis left upper eyelid: Secondary | ICD-10-CM | POA: Diagnosis not present

## 2024-03-05 ENCOUNTER — Other Ambulatory Visit: Payer: Self-pay

## 2024-03-05 ENCOUNTER — Emergency Department
Admission: EM | Admit: 2024-03-05 | Discharge: 2024-03-05 | Disposition: A | Discharge status: Home / Self Care | Payer: Worker's Compensation | Source: Ambulatory Visit | Attending: Emergency Medicine | Admitting: Emergency Medicine

## 2024-03-05 ENCOUNTER — Encounter: Payer: Self-pay | Admitting: Emergency Medicine

## 2024-03-05 ENCOUNTER — Emergency Department

## 2024-03-05 DIAGNOSIS — S41112A Laceration without foreign body of left upper arm, initial encounter: Secondary | ICD-10-CM | POA: Diagnosis not present

## 2024-03-05 DIAGNOSIS — W19XXXA Unspecified fall, initial encounter: Secondary | ICD-10-CM | POA: Diagnosis not present

## 2024-03-05 DIAGNOSIS — S51812A Laceration without foreign body of left forearm, initial encounter: Secondary | ICD-10-CM | POA: Diagnosis not present

## 2024-03-05 DIAGNOSIS — R001 Bradycardia, unspecified: Secondary | ICD-10-CM | POA: Diagnosis not present

## 2024-03-05 DIAGNOSIS — R55 Syncope and collapse: Secondary | ICD-10-CM | POA: Insufficient documentation

## 2024-03-05 DIAGNOSIS — S4992XA Unspecified injury of left shoulder and upper arm, initial encounter: Secondary | ICD-10-CM | POA: Diagnosis present

## 2024-03-05 HISTORY — DX: Dizziness and giddiness: R42

## 2024-03-05 LAB — URINALYSIS, ROUTINE W REFLEX MICROSCOPIC
Bilirubin Urine: NEGATIVE
Glucose, UA: >=500 mg/dL — AB
Hgb urine dipstick: NEGATIVE
Ketones, ur: 20 mg/dL — AB
Leukocytes,Ua: NEGATIVE
Nitrite: NEGATIVE
Protein, ur: NEGATIVE mg/dL
Specific Gravity, Urine: 1.026 (ref 1.005–1.030)
pH: 5 (ref 5.0–8.0)

## 2024-03-05 LAB — COMPREHENSIVE METABOLIC PANEL WITH GFR
ALT: 55 U/L — ABNORMAL HIGH (ref 0–44)
AST: 45 U/L — ABNORMAL HIGH (ref 15–41)
Albumin: 4.1 g/dL (ref 3.5–5.0)
Alkaline Phosphatase: 44 U/L (ref 38–126)
Anion gap: 12 (ref 5–15)
BUN: 16 mg/dL (ref 8–23)
CO2: 20 mmol/L — ABNORMAL LOW (ref 22–32)
Calcium: 9.6 mg/dL (ref 8.9–10.3)
Chloride: 104 mmol/L (ref 98–111)
Creatinine, Ser: 0.71 mg/dL (ref 0.61–1.24)
GFR, Estimated: >60 mL/min (ref >60)
Glucose, Bld: 136 mg/dL — ABNORMAL HIGH (ref 70–99)
Potassium: 4.4 mmol/L (ref 3.5–5.1)
Sodium: 136 mmol/L (ref 135–145)
Total Bilirubin: 1.9 mg/dL — ABNORMAL HIGH (ref 0.0–1.2)
Total Protein: 7.8 g/dL (ref 6.5–8.1)

## 2024-03-05 LAB — CBC
HCT: 47.5 % (ref 39.0–52.0)
Hemoglobin: 16.8 g/dL (ref 13.0–17.0)
MCH: 31.9 pg (ref 26.0–34.0)
MCHC: 35.4 g/dL (ref 30.0–36.0)
MCV: 90.3 fL (ref 80.0–100.0)
Platelets: 250 K/uL (ref 150–400)
RBC: 5.26 MIL/uL (ref 4.22–5.81)
RDW: 12.9 % (ref 11.5–15.5)
WBC: 8.9 K/uL (ref 4.0–10.5)
nRBC: 0 % (ref 0.0–0.2)

## 2024-03-05 LAB — TROPONIN I (HIGH SENSITIVITY): Troponin I (High Sensitivity): <2 ng/L (ref <18)

## 2024-03-05 MED ORDER — CEPHALEXIN 500 MG PO CAPS
500.0000 mg | ORAL_CAPSULE | Freq: Two times a day (BID) | ORAL | 0 refills | Status: AC
Start: 2024-03-05 — End: 2024-03-10

## 2024-03-05 MED ORDER — KETOROLAC TROMETHAMINE 15 MG/ML IJ SOLN
15.0000 mg | Freq: Once | INTRAMUSCULAR | Status: AC
  Administered 2024-03-05: 15 mg via INTRAVENOUS
  Filled 2024-03-05: qty 1

## 2024-03-05 MED ORDER — LIDOCAINE-EPINEPHRINE-TETRACAINE (LET) TOPICAL GEL
3.0000 mL | Freq: Once | TOPICAL | Status: AC
  Administered 2024-03-05: 3 mL via TOPICAL
  Filled 2024-03-05: qty 3

## 2024-03-05 MED ORDER — SODIUM CHLORIDE 0.9 % IV BOLUS
1000.0000 mL | Freq: Once | INTRAVENOUS | Status: AC
  Administered 2024-03-05: 1000 mL via INTRAVENOUS

## 2024-03-05 NOTE — ED Provider Notes (Signed)
 [facility] Emergency Department Provider Note     Event Date/Time   First MD Initiated Contact with Patient 03/05/24 1406     (approximate)   History   Laceration and Loss of Consciousness   HPI  Sean Mullins is a 73 y.o. male with a past medical history of vertigo presents to the ED from [facility] for syncopal episode during laceration repair procedure.  Patient reports he fell earlier today on a wet floor sustaining a laceration to his right posterior arm.  Patient denies head injury or LOC.  During my evaluation patient reports significantly improvement in symptoms.  He is complaining of right shoulder pain in which she believes he may have hit hit during the fall.  Denies chest pain or shortness of breath.     Physical Exam   Triage Vital Signs: ED Triage Vitals [03/05/24 1205]  Encounter Vitals Group     BP (!) 101/54     Girls Systolic BP Percentile      Girls Diastolic BP Percentile      Boys Systolic BP Percentile      Boys Diastolic BP Percentile      Pulse Rate (!) 56     Resp 17     Temp 98 F (36.7 C)     Temp Source Oral     SpO2 94 %     Weight 268 lb (121.6 kg)     Height 6' 4 (1.93 m)     Head Circumference      Peak Flow      Pain Score 0     Pain Loc      Pain Education      Exclude from Growth Chart     Most recent vital signs: Vitals:   03/05/24 1425 03/05/24 1740  BP: 127/70 (!) 144/68  Pulse: 60 65  Resp: 18 18  Temp: 98.1 F (36.7 C)   SpO2: 95% 100%    General: Well appearing and comfortable. Alert and oriented. INAD.  Head:  NCAT.  Eyes:  PERRLA. EOMI.  CV:  Good peripheral perfusion.  RESP:  Normal effort.  MSK:   Full ROM in all joints. No swelling, deformity or tenderness.  NEURO: Cranial nerves intact. No focal deficits. Speech is clear. Sensation and motor function intact. Normal muscle strength of UE & LE. Gait is steady.  OTHER:  Mild tenderness to palpation to right lateral shoulder.  F  ROM  Approximately 5 cm laceration to superior elbow region to posterior aspect.  ED Results / Procedures / Treatments   Labs (all labs ordered are listed, but only abnormal results are displayed) Labs Reviewed  COMPREHENSIVE METABOLIC PANEL WITH GFR - Abnormal; Notable for the following components:      Result Value   CO2 20 (*)    Glucose, Bld 136 (*)    AST 45 (*)    ALT 55 (*)    Total Bilirubin 1.9 (*)    All other components within normal limits  URINALYSIS, ROUTINE W REFLEX MICROSCOPIC - Abnormal; Notable for the following components:   Color, Urine YELLOW (*)    APPearance CLEAR (*)    Glucose, UA >=500 (*)    Ketones, ur 20 (*)    Bacteria, UA RARE (*)    All other components within normal limits  CBC  CBG MONITORING, ED  TROPONIN I (HIGH SENSITIVITY)  TROPONIN I (HIGH SENSITIVITY)   EKG  Sinus bradycardia with first AV block  RADIOLOGY  I personally viewed and evaluated these images as part of my medical decision making, as well as reviewing the written report by the radiologist.  ED Provider Interpretation: No acute fracture or dislocation noted  DG Shoulder Right Result Date: 03/05/2024 CLINICAL DATA:  Fall and trauma to the right shoulder. EXAM: RIGHT SHOULDER - 2+ VIEW COMPARISON:  None Available. FINDINGS: No acute fracture or dislocation. The bones are osteopenic. Moderate arthritic changes of the right shoulder and spurring of the right AC joint. The soft tissues are unremarkable. IMPRESSION: 1. No acute fracture or dislocation. 2. Osteopenia and arthritic changes. Electronically Signed   By: Vanetta Chou M.D.   On: 03/05/2024 15:57   PROCEDURES:  Critical Care performed: No  .Laceration Repair  Date/Time: 03/05/2024 6:23 PM  Performed by: Margrette Monte A, PA-C Authorized by: Margrette Monte A, PA-C   Consent:    Consent obtained:  Verbal   Consent given by:  Patient   Risks, benefits, and alternatives were discussed: yes     Risks  discussed:  Infection, pain, tendon damage, poor wound healing, nerve damage and need for additional repair Anesthesia:    Anesthesia method:  Topical application Laceration details:    Location:  Shoulder/arm   Shoulder/arm location:  L upper arm   Length (cm):  5   Depth (mm):  0.5 Exploration:    Hemostasis achieved with:  LET Treatment:    Area cleansed with:  Saline   Amount of cleaning:  Standard   Irrigation solution:  Sterile saline   Irrigation method:  Syringe Skin repair:    Repair method:  Steri-Strips and sutures   Suture size:  3-0   Suture material:  Nylon   Suture technique:  Simple interrupted   Number of sutures:  7   Number of Steri-Strips:  2 Approximation:    Approximation:  Close Repair type:    Repair type:  Simple Post-procedure details:    Dressing:  Non-adherent dressing   Procedure completion:  Tolerated   MEDICATIONS ORDERED IN ED: Medications  sodium chloride  0.9 % bolus 1,000 mL (0 mLs Intravenous Stopped 03/05/24 1715)  lidocaine -EPINEPHrine -tetracaine (LET) topical gel (3 mLs Topical Given 03/05/24 1615)  ketorolac  (TORADOL ) 15 MG/ML injection 15 mg (15 mg Intravenous Given 03/05/24 1615)   IMPRESSION / MDM / ASSESSMENT AND PLAN / ED COURSE  I reviewed the triage vital signs and the nursing notes.                              Clinical Course as of 03/05/24 1817  Mon Mar 05, 2024  1816 DG Shoulder Right IMPRESSION: 1. No acute fracture or dislocation. 2. Osteopenia and arthritic changes.   [MH]    Clinical Course User Index [MH] Margrette Monte A, PA-C    74 y.o. male presents to the emergency department for evaluation and treatment of fall sustaining laceration. See HPI for further details.   Differential diagnosis includes, but is not limited to laceration, fracture, abrasion, skin tear, dislocation, contusion, hematoma, electrolyte abnormality, arrhythmia, vasovagal syncope  Patient's presentation is most consistent with acute  complicated illness / injury requiring diagnostic workup.  Patient is initially alert and oriented.  Noted vital signs of a pulse rate of 56 and BP of 101/54.  Will administer IV fluids, obtain right shoulder x-ray.  Lab work obtained in triage is reassuring.  Urinalysis reveals large amount of glucose and ketones.  Discussed with patient  of diabetic history in which patient reports he is a prediabetic and is losing weight with diet.  I further discussed with these results today that he follow-up with his primary care this week to discuss of further management.  On reassessment he reports feeling significantly better than on initial assessment following fluids and Toradol .  Right shoulder x-ray is reassuring of any fracture or dislocation.  A sling was offered but patient kindly declined.  I encouraged range of motion exercises at home.  Laceration procedure performed and patient tolerated well.  Please see procedure note.  Advised patient to follow-up with primary care provider or return to ED in 7 to 10 days for suture removal.  Patient stable condition for discharge home.  ED return precaution discussed. All questions and concerns were addressed during this ED visit.    FINAL CLINICAL IMPRESSION(S) / ED DIAGNOSES   Final diagnoses:  Fall, initial encounter  Laceration of left upper extremity, initial encounter     Rx / DC Orders   ED Discharge Orders          Ordered    cephALEXin  (KEFLEX ) 500 MG capsule  2 times daily        03/05/24 1727             Note:  This document was prepared using Dragon voice recognition software and may include unintentional dictation errors.    Margrette, Keyshla Tunison A, PA-C 03/05/24 MAURINE Suzanne Kirsch, MD 03/06/24 [phone]

## 2024-03-05 NOTE — ED Notes (Signed)
 I called lab and same advised they would add on the trop to last collection

## 2024-03-05 NOTE — ED Triage Notes (Signed)
 Patient to ED from [facility] for syncope. Pt was initially at [facility] for a laceration repair and had syncopal episode with getting lidocaine  administered. Laceration to right arm- wrapped at this time.

## 2024-03-05 NOTE — Discharge Instructions (Addendum)
 Follow-up with your primary care provider in 1 week to discuss this ED visit and suture removal.  Keep sutures dry for first 24 hrs. Keep suture site of clean & dry. Gently use soap & water after first 24 hrs. DO NOT USE alcohol, hydrogen peroxide etc, to clean skin. You may cover the incision with clean gauze & replace it after your daily shower for your comfort. If you have skin tapes (Steristrips) or skin glue (Dermabond) on your incision, leave them in place. They will fall off on their own like a scab in a few weeks.  You may trim any edges that curl up with clean scissors.

## 2024-03-05 NOTE — ED Triage Notes (Signed)
 Presents from [facility]. Patient with WC laceration and while lidocaine  was being administered by provider at [facility], patient HR dropped to 40's and patient had syncopal/vagal response.  Patient initially diaphoretic, but presents AAOx3. Able to sit and transfer to wheelchair from stretcher. NAD

## 2024-03-05 NOTE — ED Notes (Signed)
 Pt states already took drug test for workers comp at [employer]. Showed this EMTP paperwork for same.

## 2024-03-13 ENCOUNTER — Encounter: Payer: Self-pay | Admitting: Internal Medicine

## 2024-03-13 NOTE — Progress Notes (Addendum)
 Subjective:    Patient ID: Sean Mullins, male    DOB: Mar 31, 1951, 73 y.o.   MRN: [id number]     HPI Elick is here for follow up from the hospital.  He is here with his wife.   9/29 [facility] [employer]. injury-  he fell backwards - he slipped on wet floor and lacerated his right arm-5 cm skin avulsion/chest laceration to the dorsal aspect of the right forearm.  He denies head trauma or LOC.  He had pain in his right calf.  He denied any numbness, tingling or weakness in the right lower extremity.  He was able to ambulate.  Repair of laceration-after administration of 7 cc of lidocaine  without epinephrine  patient began to feel lightheaded and became pale.  Cool compress applied to his head and neck.  He laid back.  He stated he could not lay back secondary to vertigo.  He has for some ice water and as he was attempting to drink it he passed out.  He had a seizure like activity versus syncope and became bradycardic down to the 40s.  This lasted approximately 2 minutes.  Sternal rubs were applied and after several minutes he regained consciousness.  He was alert and orientated x 3.  He was transported to the emergency department.  ED-during evaluation he had significantly improvement in his symptoms.  Most likely syncope episode-not seizure.  He did complain of right shoulder pain which he thinks he hit during the fall.  BP 101/54, heart rate 56.  He received fluids.  X-ray of shoulder showed no acute injury.  EKG sinus bradycardia with first-degree AV block.  Right arm laceration-5 cm closed with 7 sutures and new Steri-Strips.  He was prescribed Keflex  500 mg twice daily x 5 days.  He is here for suture removal.  Workup in the emergency room did not show glucosuria and his wife were concerned about that.  Medications and allergies reviewed with patient and updated if appropriate.  Current Outpatient Medications on File Prior to Visit  Medication Sig Dispense Refill    amLODipine  (NORVASC ) 5 MG tablet TAKE ONE TABLET BY MOUTH ONCE A DAY 90 tablet 3   aspirin  EC 81 MG tablet Take 1 tablet (81 mg total) by mouth daily. 30 tablet 0   EPINEPHrine  0.3 mg/0.3 mL IJ SOAJ injection Inject 0.3 mg into the muscle as needed for anaphylaxis. 1 each 2   FARXIGA  10 MG TABS tablet TAKE ONE TABLET (10 MG TOTAL) BY MOUTH DAILY. 90 tablet 2   fluticasone (FLONASE) 50 MCG/ACT nasal spray Place 2 sprays into both nostrils daily.     lisinopril  (ZESTRIL ) 20 MG tablet TAKE ONE TABLET (20 MG TOTAL) BY MOUTH DAILY. 90 tablet 2   meclizine  (ANTIVERT ) 25 MG tablet Take 1 tablet (25 mg total) by mouth 3 (three) times daily as needed. 30 tablet 0   Multiple Vitamin (MULTIVITAMIN WITH MINERALS) TABS tablet Take 1 tablet by mouth daily.     ondansetron  (ZOFRAN -ODT) 4 MG disintegrating tablet Take 1 tablet (4 mg total) by mouth every 8 (eight) hours as needed for nausea or vomiting. 20 tablet 0   No current facility-administered medications on file prior to visit.     Review of Systems  Musculoskeletal:  Positive for arthralgias.  Neurological:  Negative for dizziness, light-headedness and headaches.       Objective:   Vitals:   03/14/24 0902  BP: 120/74  Pulse: 72  Temp:  98.2 F (36.8 C)  SpO2: 97%   BP Readings from Last 3 Encounters:  03/14/24 120/74  03/05/24 (!) 144/68  02/20/24 124/68   Wt Readings from Last 3 Encounters:  03/14/24 270 lb (122.5 kg)  03/05/24 268 lb (121.6 kg)  02/20/24 268 lb (121.6 kg)   Body mass index is 32.87 kg/m.    Physical Exam Constitutional:      General: He is not in acute distress.    Appearance: Normal appearance. He is not ill-appearing.  HENT:     Head: Normocephalic and atraumatic.  Skin:    General: Skin is warm and dry.     Comments: Right proximal forearm just below elbow laceration with 7 sutures, Steri-Strips.  Sutures removed without difficulty.  Laceration pulling open in a couple of areas.  Minimal bleeding  with suture removal.  No pus.  No surrounding erythema or swelling.  Neurological:     Mental Status: He is alert.      Suture Removal  Date/Time: 03/14/2024 9:10 AM  Performed by: Geofm Glade PARAS, MD Authorized by: Geofm Glade PARAS, MD  Body area: upper extremity Location details: right lower arm Wound Appearance: clean Sutures Removed: 7 Post-removal: Steri-Strips applied, antibiotic ointment applied and dressing applied Patient tolerance: patient tolerated the procedure well with no immediate complications      Lab Results  Component Value Date   WBC 8.9 03/05/2024   HGB 16.8 03/05/2024   HCT 47.5 03/05/2024   PLT 250 03/05/2024   GLUCOSE 136 (H) 03/05/2024   CHOL 167 11/23/2023   TRIG 132.0 11/23/2023   HDL 38.70 (L) 11/23/2023   LDLCALC 102 (H) 11/23/2023   ALT 55 (H) 03/05/2024   AST 45 (H) 03/05/2024   NA 136 03/05/2024   K 4.4 03/05/2024   CL 104 03/05/2024   CREATININE 0.71 03/05/2024   BUN 16 03/05/2024   CO2 20 (L) 03/05/2024   TSH 0.90 05/25/2023   PSA 1.36 05/25/2023   INR 1.0 02/16/2024   HGBA1C 5.8 11/23/2023     Assessment & Plan:    See Problem List for Assessment and Plan of chronic medical problems.

## 2024-03-14 ENCOUNTER — Ambulatory Visit (INDEPENDENT_AMBULATORY_CARE_PROVIDER_SITE_OTHER): Admitting: Internal Medicine

## 2024-03-14 VITALS — BP 120/74 | HR 72 | Temp 98.2°F | Ht 76.0 in | Wt 270.0 lb

## 2024-03-14 DIAGNOSIS — S41111A Laceration without foreign body of right upper arm, initial encounter: Secondary | ICD-10-CM

## 2024-03-14 DIAGNOSIS — E785 Hyperlipidemia, unspecified: Secondary | ICD-10-CM

## 2024-03-14 DIAGNOSIS — E1159 Type 2 diabetes mellitus with other circulatory complications: Secondary | ICD-10-CM | POA: Diagnosis not present

## 2024-03-14 DIAGNOSIS — Z7984 Long term (current) use of oral hypoglycemic drugs: Secondary | ICD-10-CM

## 2024-03-14 DIAGNOSIS — I152 Hypertension secondary to endocrine disorders: Secondary | ICD-10-CM | POA: Diagnosis not present

## 2024-03-14 DIAGNOSIS — E1169 Type 2 diabetes mellitus with other specified complication: Secondary | ICD-10-CM | POA: Diagnosis not present

## 2024-03-14 NOTE — Patient Instructions (Signed)
      Keep the steri-strips on the wound.  Keep an anti-bacterial ointment on it.  Keep areas covered while at work.

## 2024-03-14 NOTE — Assessment & Plan Note (Signed)
 Chronic Blood pressure very well-controlled Continue amlodipine  5 mg daily, lisinopril  20 mg daily Discussed that I do want him to monitor his blood pressure regularly at home-concerned about possible hypotension

## 2024-03-14 NOTE — Assessment & Plan Note (Signed)
 Acute Slipped and fell at work on 9/29 and lacerated his right forearm Seen in emergency room and laceration sutured with 7 sutures Steri-Strips placed to help keep laceration closed Completed Keflex  500 mg twice daily x 5 days Laceration looks clean and is healing Sutures removed-see procedure note Areas of laceration are not fully closed Applied antibacterial ointment Place Steri-Strips and advised him to be careful to prevent too much pulling on the wound Continue to keep covered and use antibacterial ointment or Vaseline until wound is completely healed

## 2024-03-14 NOTE — Assessment & Plan Note (Signed)
 Chronic Associated with hyperlipidemia and hypertension  Lab Results  Component Value Date   HGBA1C 5.8 11/23/2023   Sugars well controlled Glucosuria seen recently in the emergency room was likely related to side effect of Farxiga -he had eaten something healthy at that day nothing high in sugar and his glucose at that time was 136-reassured that this was not concerning Continue Farxiga  10 mg daily

## 2024-03-14 NOTE — Assessment & Plan Note (Signed)
 Chronic Did not tolerate Crestor -joint pain, achiness Lifestyle controlled

## 2024-04-04 ENCOUNTER — Ambulatory Visit (INDEPENDENT_AMBULATORY_CARE_PROVIDER_SITE_OTHER)

## 2024-04-04 VITALS — BP 122/74 | HR 65 | Ht 75.5 in | Wt 272.4 lb

## 2024-04-04 DIAGNOSIS — Z0000 Encounter for general adult medical examination without abnormal findings: Secondary | ICD-10-CM

## 2024-04-04 NOTE — Progress Notes (Signed)
 Subjective:   Sean Mullins is a 73 y.o. who presents for a Medicare Wellness preventive visit.  As a reminder, Annual Wellness Visits don't include a physical exam, and some assessments may be limited, especially if this visit is performed virtually. We may recommend an in-person follow-up visit with your provider if needed.  Visit Complete: In person  Persons Participating in Visit: Patient.  AWV Questionnaire: No: Patient Medicare AWV questionnaire was not completed prior to this visit.  Cardiac Risk Factors include: advanced age (>60men, >34 women);diabetes mellitus;dyslipidemia;hypertension;male gender;obesity (BMI >30kg/m2)     Objective:    Today's Vitals   04/04/24 1442  BP: 122/74  Pulse: 65  Weight: 272 lb 6.4 oz (123.6 kg)  Height: 6' 3.5 (1.918 m)   Body mass index is 33.6 kg/m.     04/04/2024    2:58 PM 03/05/2024   12:06 PM 02/16/2024   12:02 PM 04/27/2023    3:25 PM 05/21/2022   11:36 AM 05/20/2021    9:52 AM 08/05/2020   10:05 AM  Advanced Directives  Does Patient Have a Medical Advance Directive? Yes No No Yes Yes No No  Type of Estate Agent of [place];Living will   Healthcare Power of [place];Living will Living will    Does patient want to make changes to medical advance directive?      No - Patient declined   Copy of Healthcare Power of Attorney in Chart? No - copy requested   No - copy requested       Current Medications (verified) Outpatient Encounter Medications as of 04/04/2024  Medication Sig   amLODipine  (NORVASC ) 5 MG tablet TAKE ONE TABLET BY MOUTH ONCE A DAY   aspirin  EC 81 MG tablet Take 1 tablet (81 mg total) by mouth daily.   EPINEPHrine  0.3 mg/0.3 mL IJ SOAJ injection Inject 0.3 mg into the muscle as needed for anaphylaxis.   FARXIGA  10 MG TABS tablet TAKE ONE TABLET (10 MG TOTAL) BY MOUTH DAILY.   fluticasone (FLONASE) 50 MCG/ACT nasal spray Place 2 sprays into both nostrils daily.   lisinopril  (ZESTRIL )  20 MG tablet TAKE ONE TABLET (20 MG TOTAL) BY MOUTH DAILY.   meclizine  (ANTIVERT ) 25 MG tablet Take 1 tablet (25 mg total) by mouth 3 (three) times daily as needed.   Multiple Vitamin (MULTIVITAMIN WITH MINERALS) TABS tablet Take 1 tablet by mouth daily.   ondansetron  (ZOFRAN -ODT) 4 MG disintegrating tablet Take 1 tablet (4 mg total) by mouth every 8 (eight) hours as needed for nausea or vomiting.   No facility-administered encounter medications on file as of 04/04/2024.    Allergies (verified) Ciprofloxacin  hcl, Latex, Bystolic  [nebivolol  hcl], Crestor  [rosuvastatin ], and Semaglutide    History: Past Medical History:  Diagnosis Date   Anxiety    Arthritis    Diverticula of colon    TIA (transient ischemic attack)    November 2013   Vertigo    Past Surgical History:  Procedure Laterality Date   COLONOSCOPY WITH PROPOFOL  N/A 07/04/2020   Procedure: COLONOSCOPY WITH PROPOFOL ;  Surgeon: Janalyn Keene NOVAK, MD;  Location: [facility] ENDOSCOPY;  Service: Endoscopy;  Laterality: N/A;   DUODENAL DIVERTICULECTOMY     2003   FLEXIBLE SIGMOIDOSCOPY N/A 08/05/2020   Procedure: FLEXIBLE SIGMOIDOSCOPY;  Surgeon: Janalyn Keene NOVAK, MD;  Location: [facility] ENDOSCOPY;  Service: Endoscopy;  Laterality: N/A;   KNEE ARTHROPLASTY Left    Family History  Problem Relation Age of Onset   Transient ischemic attack Father  Social History   Socioeconomic History   Marital status: Married    Spouse name: Not on file   Number of children: Not on file   Years of education: Not on file   Highest education level: Bachelor's degree (e.g., BA, AB, BS)  Occupational History   Not on file  Tobacco Use   Smoking status: Former    Current packs/day: 0.00    Average packs/day: 1.5 packs/day for 30.0 years (45.0 ttl pk-yrs)    Types: Cigarettes    Start date: 06/07/1966    Quit date: 06/07/1996    Years since quitting: 27.8   Smokeless tobacco: Never  Vaping Use   Vaping status: Never Used  Substance and  Sexual Activity   Alcohol use: Yes    Alcohol/week: 1.0 standard drink of alcohol    Types: 1 Cans of beer per week    Comment: ocassional    Drug use: Never   Sexual activity: Yes  Other Topics Concern   Not on file  Social History Narrative   No regular exercise.    Social Drivers of Corporate Investment Banker Strain: Low Risk  (04/04/2024)   Overall Financial Resource Strain (CARDIA)    Difficulty of Paying Living Expenses: Not hard at all  Food Insecurity: No Food Insecurity (04/04/2024)   Hunger Vital Sign    Worried About Running Out of Food in the Last Year: Never true    Ran Out of Food in the Last Year: Never true  Transportation Needs: No Transportation Needs (04/04/2024)   PRAPARE - Administrator, Civil Service (Medical): No    Lack of Transportation (Non-Medical): No  Physical Activity: Insufficiently Active (04/04/2024)   Exercise Vital Sign    Days of Exercise per Week: 6 days    Minutes of Exercise per Session: 20 min  Stress: No Stress Concern Present (04/04/2024)   [employer] of Occupational Health - Occupational Stress Questionnaire    Feeling of Stress: Not at all  Social Connections: Moderately Isolated (04/04/2024)   Social Connection and Isolation Panel    Frequency of Communication with Friends and Family: More than three times a week    Frequency of Social Gatherings with Friends and Family: More than three times a week    Attends Religious Services: Never    Database Administrator or Organizations: No    Attends Engineer, Structural: Never    Marital Status: Married    Tobacco Counseling Counseling given: Not Answered    Clinical Intake:  Pre-visit preparation completed: Yes  Pain : No/denies pain     BMI - recorded: 33.6 Nutritional Status: BMI > 30  Obese Nutritional Risks: None Diabetes: Yes CBG done?: No Did pt. bring in CBG monitor from home?: No  Lab Results  Component Value Date   HGBA1C 5.8  11/23/2023   HGBA1C 5.8 05/25/2023   HGBA1C 5.5 11/22/2022     How often do you need to have someone help you when you read instructions, pamphlets, or other written materials from your doctor or pharmacy?: 1 - Never  Interpreter Needed?: No  Information entered by :: Verdie Saba, CMA   Activities of Daily Living     04/04/2024    2:46 PM 04/27/2023    3:06 PM  In your present state of health, do you have any difficulty performing the following activities:  Hearing? 0 0  Vision? 0 0  Difficulty concentrating or making decisions? 0 0  Walking  or climbing stairs? 0 0  Dressing or bathing? 0 0  Doing errands, shopping? 0 0  Preparing Food and eating ? N N  Using the Toilet? N N  In the past six months, have you accidently leaked urine? N N  Do you have problems with loss of bowel control? N N  Managing your Medications? N N  Managing your Finances? N N  Housekeeping or managing your Housekeeping? N N    Patient Care Team: Geofm Glade PARAS, MD as PCP - General (Internal Medicine) Laurice Francis NOVAK, OD as Consulting Physician (Optometry)  I have updated your Care Teams any recent Medical Services you may have received from other providers in the past year.     Assessment:   This is a routine wellness examination for [place].  Hearing/Vision screen Hearing Screening - Comments:: Denies hearing difficulties   Vision Screening - Comments:: Wears eyeglasses for reading - up to date with routine eye exams with Francis Laurice   Goals Addressed               This Visit's Progress     Patient Stated (pt-stated)        Patient stated he plans to continue managing diet and weight       Depression Screen     04/04/2024    2:47 PM 02/20/2024    9:38 AM 11/23/2023    8:50 AM 04/27/2023    3:02 PM 11/22/2022    8:37 AM 08/24/2022    1:48 PM 05/21/2022   11:41 AM  PHQ 2/9 Scores  PHQ - 2 Score 0 0 0 0 0 0 0  PHQ- 9 Score 0 0  0       Fall Risk     04/04/2024    2:46 PM  02/20/2024    9:38 AM 11/23/2023    8:45 AM 04/27/2023    3:07 PM 11/22/2022    8:37 AM  Fall Risk   Falls in the past year? 1 1 0 0 1  Number falls in past yr: 0 0 0 0 0  Comment 1      Injury with Fall? 1 1 0 0 0  Comment stitchees on right arm      Risk for fall due to : History of fall(s) No Fall Risks No Fall Risks No Fall Risks No Fall Risks  Follow up Falls evaluation completed;Falls prevention discussed Falls evaluation completed Falls evaluation completed Falls prevention discussed Falls evaluation completed    MEDICARE RISK AT HOME:  Medicare Risk at Home Any stairs in or around the home?: Yes If so, are there any without handrails?: No Home free of loose throw rugs in walkways, pet beds, electrical cords, etc?: Yes Adequate lighting in your home to reduce risk of falls?: Yes Life alert?: No Use of a cane, walker or w/c?: No Grab bars in the bathroom?: Yes Shower chair or bench in shower?: No Elevated toilet seat or a handicapped toilet?: Yes  TIMED UP AND GO:  Was the test performed?  No  Cognitive Function: 6CIT completed    04/27/2023    3:06 PM  MMSE - Mini Mental State Exam  Orientation to time 5  Orientation to Place 5  Registration 3  Attention/ Calculation 5  Recall 3  Language- name 2 objects 2  Language- repeat 1  Language- follow 3 step command 3  Language- read & follow direction 1  Write a sentence 1  Copy design 1  Total  score 30        04/04/2024    2:50 PM 04/27/2023    3:04 PM 05/21/2022   11:51 AM  6CIT Screen  What Year? 0 points 0 points 0 points  What month? 0 points 0 points 0 points  What time? 0 points 0 points 0 points  Count back from 20 0 points 0 points   Months in reverse 0 points 0 points 0 points  Repeat phrase 0 points 0 points 0 points  Total Score 0 points 0 points     Immunizations Immunization History  Administered Date(s) Administered   Fluad Quad(high Dose 65+) 02/28/2019, 02/28/2020, 05/21/2022    INFLUENZA, HIGH DOSE SEASONAL PF 02/24/2018   Influenza,inj,Quad PF,6+ Mos 02/22/2017   Influenza-Unspecified 04/17/2015, 03/04/2021   Pneumococcal Conjugate-13 08/28/2018   Pneumococcal Polysaccharide-23 08/28/2019   Tdap 12/04/2021   Zoster Recombinant(Shingrix) 03/23/2021, 05/20/2021    Screening Tests Health Maintenance  Topic Date Due   Diabetic kidney evaluation - Urine ACR  Never done   COVID-19 Vaccine (1 - 2025-26 season) 04/20/2024 (Originally 02/06/2024)   Influenza Vaccine  09/04/2024 (Originally 01/06/2024)   Colonoscopy  04/04/2025 (Originally 07/05/2023)   HEMOGLOBIN A1C  05/24/2024   FOOT EXAM  11/22/2024   OPHTHALMOLOGY EXAM  12/20/2024   Diabetic kidney evaluation - eGFR measurement  03/05/2025   Medicare Annual Wellness (AWV)  04/04/2025   DTaP/Tdap/Td (2 - Td or Tdap) 12/05/2031   Pneumococcal Vaccine: 50+ Years  Completed   Hepatitis C Screening  Completed   Meningococcal B Vaccine  Aged Out   Zoster Vaccines- Shingrix  Discontinued    Health Maintenance Items Addressed:  I have recommended that this patient have a immunization for a repeat Colonoscopy due to having colon polyps, Influenza, and COVID vaccines but he declines at this time. I have discussed the risks and benefits of this procedure with him. The patient verbalizes understanding.   Additional Screening:  Vision Screening: Recommended annual ophthalmology exams for early detection of glaucoma and other disorders of the eye. Is the patient up to date with their annual eye exam?  Yes  Who is the provider or what is the name of the office in which the patient attends annual eye exams? [facility]  Dental Screening: Recommended annual dental exams for proper oral hygiene  Community Resource Referral / Chronic Care Management: CRR required this visit?  No   CCM required this visit?  No   Plan:    I have personally reviewed and noted the following in the patient's chart:   Medical and social  history Use of alcohol, tobacco or illicit drugs  Current medications and supplements including opioid prescriptions. Patient is not currently taking opioid prescriptions. Functional ability and status Nutritional status Physical activity Advanced directives List of other physicians Hospitalizations, surgeries, and ER visits in previous 12 months Vitals Screenings to include cognitive, depression, and falls Referrals and appointments  In addition, I have reviewed and discussed with patient certain preventive protocols, quality metrics, and best practice recommendations. A written personalized care plan for preventive services as well as general preventive health recommendations were provided to patient.   Verdie CHRISTELLA Saba, CMA   04/04/2024   After Visit Summary: (In Person-Declined) Patient declined AVS at this time.  Notes: Nothing significant to report at this time.

## 2024-04-04 NOTE — Patient Instructions (Addendum)
 Mr. Signorelli,  Thank you for taking the time for your Medicare Wellness Visit. I appreciate your continued commitment to your health goals. Please review the care plan we discussed, and feel free to reach out if I can assist you further.  Medicare recommends these wellness visits once per year to help you and your care team stay ahead of potential health issues. These visits are designed to focus on prevention, allowing your provider to concentrate on managing your acute and chronic conditions during your regular appointments.  Please note that Annual Wellness Visits do not include a physical exam. Some assessments may be limited, especially if the visit was conducted virtually. If needed, we may recommend a separate in-person follow-up with your provider.  Ongoing Care Seeing your primary care provider every 3 to 6 months helps us  monitor your health and provide consistent, personalized care.   Referrals If a referral was made during today's visit and you haven't received any updates within two weeks, please contact the referred provider directly to check on the status.  Recommended Screenings:  Health Maintenance  Topic Date Due   Yearly kidney health urinalysis for diabetes  Never done   Colon Cancer Screening  07/05/2023   Flu Shot  01/06/2024   COVID-19 Vaccine (1 - 2025-26 season) Never done   Hemoglobin A1C  05/24/2024   Complete foot exam   11/22/2024   Eye exam for diabetics  12/20/2024   Yearly kidney function blood test for diabetes  03/05/2025   Medicare Annual Wellness Visit  04/04/2025   DTaP/Tdap/Td vaccine (2 - Td or Tdap) 12/05/2031   Pneumococcal Vaccine for age over 42  Completed   Hepatitis C Screening  Completed   Meningitis B Vaccine  Aged Out   Zoster (Shingles) Vaccine  Discontinued       03/05/2024   12:06 PM  Advanced Directives  Does Patient Have a Medical Advance Directive? No   Advance Care Planning is important because it: Ensures you receive  medical care that aligns with your values, goals, and preferences. Provides guidance to your family and loved ones, reducing the emotional burden of decision-making during critical moments.  Vision: Annual vision screenings are recommended for early detection of glaucoma, cataracts, and diabetic retinopathy. These exams can also reveal signs of chronic conditions such as diabetes and high blood pressure.  Dental: Annual dental screenings help detect early signs of oral cancer, gum disease, and other conditions linked to overall health, including heart disease and diabetes.

## 2024-04-10 ENCOUNTER — Other Ambulatory Visit: Payer: Self-pay | Admitting: Internal Medicine

## 2024-04-26 DIAGNOSIS — H04123 Dry eye syndrome of bilateral lacrimal glands: Secondary | ICD-10-CM | POA: Diagnosis not present

## 2024-04-26 DIAGNOSIS — H01001 Unspecified blepharitis right upper eyelid: Secondary | ICD-10-CM | POA: Diagnosis not present

## 2024-04-26 DIAGNOSIS — H01004 Unspecified blepharitis left upper eyelid: Secondary | ICD-10-CM | POA: Diagnosis not present

## 2024-05-15 NOTE — Patient Instructions (Addendum)
      Blood work was ordered.       Medications changes include :   None    A referral was ordered and someone will call you to schedule an appointment.     Return in about 6 months (around 11/14/2024) for Physical Exam.

## 2024-05-15 NOTE — Progress Notes (Unsigned)
      Subjective:    Patient ID: Sean Mullins, male    DOB: 1950-10-04, 73 y.o.   MRN: [id number]     HPI Sean Mullins is here for follow up of his chronic medical problems.    Medications and allergies reviewed with patient and updated if appropriate.  Current Outpatient Medications on File Prior to Visit  Medication Sig Dispense Refill   amLODipine  (NORVASC ) 5 MG tablet TAKE ONE TABLET BY MOUTH ONCE A DAY 90 tablet 3   aspirin  EC 81 MG tablet Take 1 tablet (81 mg total) by mouth daily. 30 tablet 0   EPINEPHrine  0.3 mg/0.3 mL IJ SOAJ injection Inject 0.3 mg into the muscle as needed for anaphylaxis. 1 each 2   FARXIGA  10 MG TABS tablet TAKE ONE TABLET (10 MG TOTAL) BY MOUTH DAILY. 90 tablet 2   fluticasone (FLONASE) 50 MCG/ACT nasal spray Place 2 sprays into both nostrils daily.     lisinopril  (ZESTRIL ) 20 MG tablet TAKE ONE TABLET (20 MG TOTAL) BY MOUTH DAILY. 90 tablet 2   meclizine  (ANTIVERT ) 25 MG tablet Take 1 tablet (25 mg total) by mouth 3 (three) times daily as needed. 30 tablet 0   Multiple Vitamin (MULTIVITAMIN WITH MINERALS) TABS tablet Take 1 tablet by mouth daily.     ondansetron  (ZOFRAN -ODT) 4 MG disintegrating tablet Take 1 tablet (4 mg total) by mouth every 8 (eight) hours as needed for nausea or vomiting. 20 tablet 0   No current facility-administered medications on file prior to visit.     Review of Systems     Objective:  There were no vitals filed for this visit. BP Readings from Last 3 Encounters:  04/04/24 122/74  03/14/24 120/74  03/05/24 (!) 144/68   Wt Readings from Last 3 Encounters:  04/04/24 272 lb 6.4 oz (123.6 kg)  03/14/24 270 lb (122.5 kg)  03/05/24 268 lb (121.6 kg)   There is no height or weight on file to calculate BMI.    Physical Exam     Lab Results  Component Value Date   WBC 8.9 03/05/2024   HGB 16.8 03/05/2024   HCT 47.5 03/05/2024   PLT 250 03/05/2024   GLUCOSE 136 (H) 03/05/2024   CHOL 167 11/23/2023   TRIG 132.0  11/23/2023   HDL 38.70 (L) 11/23/2023   LDLCALC 102 (H) 11/23/2023   ALT 55 (H) 03/05/2024   AST 45 (H) 03/05/2024   NA 136 03/05/2024   K 4.4 03/05/2024   CL 104 03/05/2024   CREATININE 0.71 03/05/2024   BUN 16 03/05/2024   CO2 20 (L) 03/05/2024   TSH 0.90 05/25/2023   PSA 1.36 05/25/2023   INR 1.0 02/16/2024   HGBA1C 5.8 11/23/2023     Assessment & Plan:    See Problem List for Assessment and Plan of chronic medical problems.

## 2024-05-16 ENCOUNTER — Ambulatory Visit: Admitting: Internal Medicine

## 2024-05-16 ENCOUNTER — Ambulatory Visit: Payer: Self-pay | Admitting: Internal Medicine

## 2024-05-16 VITALS — BP 118/74 | HR 69 | Temp 97.6°F | Ht 75.5 in | Wt 273.0 lb

## 2024-05-16 DIAGNOSIS — I152 Hypertension secondary to endocrine disorders: Secondary | ICD-10-CM

## 2024-05-16 DIAGNOSIS — E66811 Obesity, class 1: Secondary | ICD-10-CM

## 2024-05-16 DIAGNOSIS — I2510 Atherosclerotic heart disease of native coronary artery without angina pectoris: Secondary | ICD-10-CM

## 2024-05-16 DIAGNOSIS — E1169 Type 2 diabetes mellitus with other specified complication: Secondary | ICD-10-CM

## 2024-05-16 DIAGNOSIS — Z8673 Personal history of transient ischemic attack (TIA), and cerebral infarction without residual deficits: Secondary | ICD-10-CM

## 2024-05-16 LAB — COMPREHENSIVE METABOLIC PANEL WITH GFR
ALT: 41 U/L (ref 0–53)
AST: 23 U/L (ref 0–37)
Albumin: 4.6 g/dL (ref 3.5–5.2)
Alkaline Phosphatase: 60 U/L (ref 39–117)
BUN: 15 mg/dL (ref 6–23)
CO2: 27 meq/L (ref 19–32)
Calcium: 9.9 mg/dL (ref 8.4–10.5)
Chloride: 103 meq/L (ref 96–112)
Creatinine, Ser: 0.74 mg/dL (ref 0.40–1.50)
GFR: 91.34 mL/min (ref >60.00)
Glucose, Bld: 111 mg/dL — ABNORMAL HIGH (ref 70–99)
Potassium: 4.5 meq/L (ref 3.5–5.1)
Sodium: 136 meq/L (ref 135–145)
Total Bilirubin: 0.8 mg/dL (ref 0.2–1.2)
Total Protein: 7.4 g/dL (ref 6.0–8.3)

## 2024-05-16 LAB — CBC WITH DIFFERENTIAL/PLATELET
Basophils Absolute: 0 K/uL (ref 0.0–0.1)
Basophils Relative: 0.5 % (ref 0.0–3.0)
Eosinophils Absolute: 0.1 K/uL (ref 0.0–0.7)
Eosinophils Relative: 1.8 % (ref 0.0–5.0)
HCT: 46.9 % (ref 39.0–52.0)
Hemoglobin: 16.3 g/dL (ref 13.0–17.0)
Lymphocytes Relative: 47.4 % — ABNORMAL HIGH (ref 12.0–46.0)
Lymphs Abs: 3 K/uL (ref 0.7–4.0)
MCHC: 34.6 g/dL (ref 30.0–36.0)
MCV: 91.7 fl (ref 78.0–100.0)
Monocytes Absolute: 0.6 K/uL (ref 0.1–1.0)
Monocytes Relative: 9.8 % (ref 3.0–12.0)
Neutro Abs: 2.6 K/uL (ref 1.4–7.7)
Neutrophils Relative %: 40.5 % — ABNORMAL LOW (ref 43.0–77.0)
Platelets: 235 K/uL (ref 150.0–400.0)
RBC: 5.12 Mil/uL (ref 4.22–5.81)
RDW: 13.8 % (ref 11.5–15.5)
WBC: 6.4 K/uL (ref 4.0–10.5)

## 2024-05-16 LAB — TSH: TSH: 0.82 u[IU]/mL (ref 0.35–5.50)

## 2024-05-16 LAB — LIPID PANEL
Cholesterol: 162 mg/dL (ref 0–200)
HDL: 41.4 mg/dL (ref >39.00)
LDL Cholesterol: 92 mg/dL (ref 0–99)
NonHDL: 120.1
Total CHOL/HDL Ratio: 4
Triglycerides: 141 mg/dL (ref 0.0–149.0)
VLDL: 28.2 mg/dL (ref 0.0–40.0)

## 2024-05-16 LAB — HEMOGLOBIN A1C: Hgb A1c MFr Bld: 5.9 % (ref 4.6–6.5)

## 2024-05-16 LAB — MICROALBUMIN / CREATININE URINE RATIO
Creatinine,U: 30.5 mg/dL
Microalb Creat Ratio: UNDETERMINED mg/g (ref 0.0–30.0)
Microalb, Ur: <0.7 mg/dL

## 2024-05-16 NOTE — Assessment & Plan Note (Signed)
 History of TIA Continue aspirin 81 mg daily Did not tolerate Crestor Would prefer not to take any medication for cholesterol-check lipids Stressed good sugar control Blood pressure controlled

## 2024-05-16 NOTE — Assessment & Plan Note (Signed)
 Chronic Blood pressure very well-controlled Continue amlodipine  5 mg daily, lisinopril  20 mg daily Monitor BP at home CBC, CMP

## 2024-05-16 NOTE — Assessment & Plan Note (Signed)
 Chronic BMI 31.84 kg/m with diabetes, hyperlipidemia, hypertension, CAD Continue weight loss efforts Stressed regular exercise, decrease portions, healthy diet

## 2024-05-16 NOTE — Assessment & Plan Note (Signed)
 Chronic No symptoms consistent with angina Continue aspirin 81 mg daily Has deferred cholesterol medication in the past Check lipid panel today

## 2024-05-16 NOTE — Assessment & Plan Note (Signed)
 Chronic Did not tolerate Crestor -joint pain, achiness Lifestyle controlled Check lipid panel, CMP, CBC, TSH Recommended trying another statin, Zetia or injectable given his CAD, history of TIA

## 2024-05-16 NOTE — Assessment & Plan Note (Signed)
 Chronic Associated with hyperlipidemia, hypertension, CAD, history of TIA  Lab Results  Component Value Date   HGBA1C 5.8 11/23/2023   Sugars well controlled Check A1c, urine albumin/creatinine ratio Continue Farxiga  10 mg daily Regular exercise, diabetic diet

## 2024-06-01 ENCOUNTER — Encounter: Payer: Self-pay | Admitting: Pharmacist

## 2024-06-01 NOTE — Progress Notes (Signed)
 Pharmacy Quality Measure Review  This patient is appearing on a report for being at risk of failing the adherence measure for hypertension (ACEi/ARB) medications this calendar year.   Medication: Lisinopril  Last fill date: 04/10/24 for 90 day supply  Insurance report was not up to date. No action needed at this time.    Darrelyn Drum, PharmD, BCPS, CPP Clinical Pharmacist Practitioner [facility] [phone]

## 2024-06-06 ENCOUNTER — Encounter

## 2024-06-28 ENCOUNTER — Encounter: Payer: Self-pay | Admitting: *Deleted

## 2024-06-28 NOTE — Progress Notes (Signed)
 Sean Mullins                                          MRN: [id number]   06/28/2024   The [facility] Quality Team Specialist reviewed this patient medical record for the purposes of chart review for care gap closure. The following were reviewed: abstraction for care gap closure-kidney health evaluation for diabetes:eGFR  and uACR.    [facility] Quality Team

## 2024-11-14 ENCOUNTER — Encounter: Admitting: Internal Medicine

## 2025-04-09 ENCOUNTER — Ambulatory Visit

## 2025-04-09 ENCOUNTER — Encounter: Admitting: Internal Medicine
